# Patient Record
Sex: Female | Born: 1935 | Race: White | Hispanic: No | Marital: Married | Smoking: Never smoker
Health system: Southern US, Community
[De-identification: ages and names within clinical notes are randomized; demographics above are authoritative.]

## PROBLEM LIST (undated history)

## (undated) DIAGNOSIS — E785 Hyperlipidemia, unspecified: Secondary | ICD-10-CM

## (undated) DIAGNOSIS — E119 Type 2 diabetes mellitus without complications: Secondary | ICD-10-CM

## (undated) DIAGNOSIS — D369 Benign neoplasm, unspecified site: Secondary | ICD-10-CM

## (undated) DIAGNOSIS — N951 Menopausal and female climacteric states: Secondary | ICD-10-CM

## (undated) DIAGNOSIS — Z974 Presence of external hearing-aid: Secondary | ICD-10-CM

## (undated) DIAGNOSIS — G4700 Insomnia, unspecified: Secondary | ICD-10-CM

## (undated) DIAGNOSIS — I10 Essential (primary) hypertension: Secondary | ICD-10-CM

## (undated) DIAGNOSIS — M1990 Unspecified osteoarthritis, unspecified site: Secondary | ICD-10-CM

## (undated) HISTORY — DX: Benign neoplasm, unspecified site: D36.9

## (undated) HISTORY — DX: Menopausal and female climacteric states: N95.1

## (undated) HISTORY — DX: Type 2 diabetes mellitus without complications: E11.9

## (undated) HISTORY — PX: MENISCUS REPAIR: SHX5179

## (undated) HISTORY — DX: Insomnia, unspecified: G47.00

## (undated) HISTORY — DX: Essential (primary) hypertension: I10

## (undated) HISTORY — DX: Hyperlipidemia, unspecified: E78.5

---

## 2004-05-11 ENCOUNTER — Ambulatory Visit: Payer: Self-pay | Admitting: Internal Medicine

## 2005-06-21 ENCOUNTER — Ambulatory Visit: Payer: Self-pay | Admitting: Internal Medicine

## 2005-10-20 ENCOUNTER — Ambulatory Visit: Payer: Self-pay | Admitting: Gastroenterology

## 2006-06-28 ENCOUNTER — Ambulatory Visit: Payer: Self-pay | Admitting: Internal Medicine

## 2007-07-05 ENCOUNTER — Ambulatory Visit: Payer: Self-pay | Admitting: Internal Medicine

## 2007-10-09 ENCOUNTER — Ambulatory Visit: Payer: Self-pay | Admitting: Gastroenterology

## 2008-01-10 ENCOUNTER — Ambulatory Visit: Payer: Self-pay | Admitting: Dermatology

## 2008-05-13 ENCOUNTER — Ambulatory Visit: Payer: Self-pay | Admitting: Unknown Physician Specialty

## 2008-07-29 ENCOUNTER — Ambulatory Visit: Payer: Self-pay | Admitting: Internal Medicine

## 2009-07-23 ENCOUNTER — Ambulatory Visit: Payer: Self-pay | Admitting: Family

## 2009-07-24 ENCOUNTER — Ambulatory Visit: Payer: Self-pay | Admitting: Unknown Physician Specialty

## 2009-09-18 ENCOUNTER — Ambulatory Visit: Payer: Self-pay | Admitting: Internal Medicine

## 2009-09-24 ENCOUNTER — Ambulatory Visit: Payer: Self-pay | Admitting: Internal Medicine

## 2010-03-31 ENCOUNTER — Ambulatory Visit: Payer: Self-pay | Admitting: Internal Medicine

## 2010-10-19 ENCOUNTER — Ambulatory Visit: Payer: Self-pay | Admitting: Internal Medicine

## 2010-11-08 ENCOUNTER — Ambulatory Visit (INDEPENDENT_AMBULATORY_CARE_PROVIDER_SITE_OTHER): Payer: Medicare Other | Admitting: Cardiovascular Disease

## 2010-11-08 ENCOUNTER — Encounter: Payer: Self-pay | Admitting: Cardiovascular Disease

## 2010-11-08 DIAGNOSIS — E119 Type 2 diabetes mellitus without complications: Secondary | ICD-10-CM | POA: Insufficient documentation

## 2010-11-08 DIAGNOSIS — I10 Essential (primary) hypertension: Secondary | ICD-10-CM

## 2010-11-08 DIAGNOSIS — R0789 Other chest pain: Secondary | ICD-10-CM

## 2010-11-08 DIAGNOSIS — E785 Hyperlipidemia, unspecified: Secondary | ICD-10-CM | POA: Insufficient documentation

## 2010-11-08 NOTE — Assessment & Plan Note (Signed)
Hemoglobin A1c very well controlled at 6.2.

## 2010-11-08 NOTE — Assessment & Plan Note (Signed)
Cholesterol is at goal on the current lipid regimen. No changes to the medications were made.  

## 2010-11-08 NOTE — Assessment & Plan Note (Signed)
Blood pressure is well controlled on today's visit. No changes made to the medications. 

## 2010-11-08 NOTE — Patient Instructions (Signed)
You are doing well. No medication changes were made. Please call us if you have new issues that need to be addressed before your next appt.  We will call you for a follow up Appt. In 12 months  

## 2010-11-08 NOTE — Assessment & Plan Note (Signed)
Chest "twinge" was very short, atypical in nature. We have asked her to continue her treadmill as she normally does 3 times a week and to contact our office if she has any further symptoms. No further workup has been ordered at this time.

## 2010-11-08 NOTE — Progress Notes (Signed)
   Patient ID: [name], female    DOB: 1932/07/13, 77 y.o.   MRN: [id number]  HPI Comments: 77 year old woman, previously known to me from the [place] office at [facility], history of hyperlipidemia, hypertension, type 2 diabetes who presents to establish care.  She reports that she has been doing well though did have a "twinge" In her left chest that occurred while she was on the treadmill. It lasted for a second and then resolved. She has not been back on the treadmill since that time. Otherwise she feels well, is active with no complaints.   Typically she uses the treadmill 3 times a week and is [place] other [place]. Total cholesterol 151, LDL 78, HDL 60. Normal LFTs, hemoglobin A1c 6.2, normal renal function  EKG shows normal sinus rhythm with a rate 81 beats per minute with no significant ST or T wave changes   Outpatient Encounter Prescriptions as of 11/08/2010  Medication Sig Dispense Refill  . aspirin 81 MG EC tablet Take 81 mg by mouth daily.        . irbesartan-hydrochlorothiazide (AVALIDE) 150-12.5 MG per tablet Take 1 tablet by mouth daily.        . metformin (FORTAMET) 500 MG (OSM) 24 hr tablet Take 500 mg by mouth daily with breakfast.        . PARoxetine (PAXIL-CR) 12.5 MG 24 hr tablet Take 12.5 mg by mouth every morning.        . simvastatin (ZOCOR) 40 MG tablet Take 40 mg by mouth at bedtime.          Review of Systems  Constitutional: Negative.   HENT: Negative.   Eyes: Negative.   Respiratory: Negative.   Cardiovascular: Negative.        Chest twinge  Gastrointestinal: Negative.   Musculoskeletal: Negative.   Skin: Negative.   Neurological: Negative.   Hematological: Negative.   Psychiatric/Behavioral: Negative.   All other systems reviewed and are negative.    BP 125/73  Pulse 81  Ht 5\' 7"  (1.702 m)  Wt 137 lb (62.143 kg)  BMI 21.46 kg/m2  Physical Exam  Nursing note and vitals  reviewed. Constitutional: She is oriented to person, place, and time. She appears well-developed and well-nourished.  HENT:  Head: Normocephalic.  Nose: Nose normal.  Mouth/Throat: Oropharynx is clear and moist.  Eyes: Conjunctivae are normal. Pupils are equal, round, and reactive to light.  Neck: Normal range of motion. Neck supple. No JVD present.  Cardiovascular: Normal rate, regular rhythm, S1 normal, S2 normal, normal heart sounds and intact distal pulses.  Exam reveals no gallop and no friction rub.   No murmur heard. Pulmonary/Chest: Effort normal and breath sounds normal. No respiratory distress. She has no wheezes. She has no rales. She exhibits no tenderness.  Abdominal: Soft. Bowel sounds are normal. She exhibits no distension. There is no tenderness.  Musculoskeletal: Normal range of motion. She exhibits no edema and no tenderness.  Lymphadenopathy:    She has no cervical adenopathy.  Neurological: She is alert and oriented to person, place, and time. Coordination normal.  Skin: Skin is warm and dry. No rash noted. No erythema.  Psychiatric: She has a normal mood and affect. Her behavior is normal. Judgment and thought content normal.         Assessment and Plan

## 2011-01-17 ENCOUNTER — Ambulatory Visit: Payer: Self-pay | Admitting: Gastroenterology

## 2011-10-20 ENCOUNTER — Ambulatory Visit: Payer: Self-pay | Admitting: Internal Medicine

## 2012-10-24 ENCOUNTER — Ambulatory Visit: Payer: Self-pay | Admitting: Internal Medicine

## 2012-10-30 ENCOUNTER — Ambulatory Visit: Payer: Self-pay | Admitting: Internal Medicine

## 2013-04-30 ENCOUNTER — Ambulatory Visit: Payer: Self-pay | Admitting: Internal Medicine

## 2013-09-30 DIAGNOSIS — M8580 Other specified disorders of bone density and structure, unspecified site: Secondary | ICD-10-CM | POA: Insufficient documentation

## 2013-10-28 ENCOUNTER — Ambulatory Visit: Payer: Self-pay | Admitting: Internal Medicine

## 2014-10-08 ENCOUNTER — Other Ambulatory Visit: Payer: Self-pay | Admitting: Internal Medicine

## 2014-10-08 DIAGNOSIS — Z1231 Encounter for screening mammogram for malignant neoplasm of breast: Secondary | ICD-10-CM

## 2014-10-30 ENCOUNTER — Other Ambulatory Visit: Payer: Self-pay | Admitting: Internal Medicine

## 2014-10-30 ENCOUNTER — Ambulatory Visit
Admission: RE | Admit: 2014-10-30 | Discharge: 2014-10-30 | Disposition: A | Discharge status: Home / Self Care | Payer: Medicare Other | Source: Ambulatory Visit | Attending: Internal Medicine | Admitting: Internal Medicine

## 2014-10-30 DIAGNOSIS — Z1231 Encounter for screening mammogram for malignant neoplasm of breast: Secondary | ICD-10-CM | POA: Insufficient documentation

## 2015-04-14 DIAGNOSIS — D692 Other nonthrombocytopenic purpura: Secondary | ICD-10-CM | POA: Insufficient documentation

## 2015-10-16 ENCOUNTER — Other Ambulatory Visit: Payer: Self-pay | Admitting: Internal Medicine

## 2015-10-16 DIAGNOSIS — Z1231 Encounter for screening mammogram for malignant neoplasm of breast: Secondary | ICD-10-CM

## 2015-11-03 ENCOUNTER — Other Ambulatory Visit: Payer: Self-pay | Admitting: Internal Medicine

## 2015-11-03 ENCOUNTER — Ambulatory Visit
Admission: RE | Admit: 2015-11-03 | Discharge: 2015-11-03 | Disposition: A | Discharge status: Home / Self Care | Payer: Medicare Other | Source: Ambulatory Visit | Attending: Internal Medicine | Admitting: Internal Medicine

## 2015-11-03 DIAGNOSIS — Z1231 Encounter for screening mammogram for malignant neoplasm of breast: Secondary | ICD-10-CM | POA: Insufficient documentation

## 2016-02-09 DIAGNOSIS — M79673 Pain in unspecified foot: Secondary | ICD-10-CM | POA: Insufficient documentation

## 2016-09-26 ENCOUNTER — Other Ambulatory Visit: Payer: Self-pay | Admitting: Internal Medicine

## 2016-09-26 DIAGNOSIS — Z1231 Encounter for screening mammogram for malignant neoplasm of breast: Secondary | ICD-10-CM

## 2016-11-07 ENCOUNTER — Ambulatory Visit
Admission: RE | Admit: 2016-11-07 | Discharge: 2016-11-07 | Disposition: A | Discharge status: Home / Self Care | Payer: Medicare Other | Source: Ambulatory Visit | Attending: Internal Medicine | Admitting: Internal Medicine

## 2016-11-07 DIAGNOSIS — Z1231 Encounter for screening mammogram for malignant neoplasm of breast: Secondary | ICD-10-CM | POA: Insufficient documentation

## 2017-02-08 ENCOUNTER — Ambulatory Visit: Payer: Medicare Other | Attending: Otolaryngology

## 2017-02-08 DIAGNOSIS — G4733 Obstructive sleep apnea (adult) (pediatric): Secondary | ICD-10-CM | POA: Insufficient documentation

## 2017-02-08 DIAGNOSIS — G4719 Other hypersomnia: Secondary | ICD-10-CM | POA: Insufficient documentation

## 2017-02-08 DIAGNOSIS — R0683 Snoring: Secondary | ICD-10-CM | POA: Insufficient documentation

## 2017-02-24 ENCOUNTER — Ambulatory Visit: Payer: Medicare Other | Attending: Otolaryngology

## 2017-02-24 DIAGNOSIS — G4733 Obstructive sleep apnea (adult) (pediatric): Secondary | ICD-10-CM | POA: Insufficient documentation

## 2017-12-06 DIAGNOSIS — F3342 Major depressive disorder, recurrent, in full remission: Secondary | ICD-10-CM | POA: Insufficient documentation

## 2017-12-07 ENCOUNTER — Other Ambulatory Visit: Payer: Self-pay | Admitting: Internal Medicine

## 2017-12-07 DIAGNOSIS — Z1231 Encounter for screening mammogram for malignant neoplasm of breast: Secondary | ICD-10-CM

## 2017-12-25 ENCOUNTER — Ambulatory Visit
Admission: RE | Admit: 2017-12-25 | Discharge: 2017-12-25 | Disposition: A | Discharge status: Home / Self Care | Payer: Medicare Other | Source: Ambulatory Visit | Attending: Internal Medicine | Admitting: Internal Medicine

## 2017-12-25 DIAGNOSIS — Z1231 Encounter for screening mammogram for malignant neoplasm of breast: Secondary | ICD-10-CM | POA: Diagnosis present

## 2018-11-07 ENCOUNTER — Other Ambulatory Visit: Payer: Self-pay

## 2018-11-07 ENCOUNTER — Encounter: Payer: Medicare Other | Attending: Internal Medicine | Admitting: Internal Medicine

## 2018-11-07 DIAGNOSIS — I87302 Chronic venous hypertension (idiopathic) without complications of left lower extremity: Secondary | ICD-10-CM | POA: Diagnosis not present

## 2018-11-07 DIAGNOSIS — I10 Essential (primary) hypertension: Secondary | ICD-10-CM | POA: Insufficient documentation

## 2018-11-07 DIAGNOSIS — E11622 Type 2 diabetes mellitus with other skin ulcer: Secondary | ICD-10-CM | POA: Diagnosis not present

## 2018-11-07 DIAGNOSIS — L97221 Non-pressure chronic ulcer of left calf limited to breakdown of skin: Secondary | ICD-10-CM | POA: Insufficient documentation

## 2018-11-07 DIAGNOSIS — Z7982 Long term (current) use of aspirin: Secondary | ICD-10-CM | POA: Diagnosis not present

## 2018-11-08 NOTE — Progress Notes (Signed)
LYRIK, DOCKSTADER ([id number]) Visit Report for 11/07/2018 Chief Complaint Document Details Patient Name: [name], [name]. Date of Service: 11/07/2018 2:15 PM Medical Record Number: [id number] Patient Account Number: [id number] Date of Birth/Sex: 11/29/1932 (85 y.o. F) Treating RN: Cornell Barman Primary Care Provider: Ramonita Lab Other Clinician: Referring Provider: Beverly Gust Treating Provider/Extender: Beverly Gust in Treatment: 0 Information Obtained from: Patient Chief Complaint Left leg skin tear from dog scratch Electronic Signature(s) Signed: 11/07/2018 2:45:58 PM By: Tobi Bastos Entered By: Tobi Bastos on 11/07/2018 14:45:58 Vitiello, Germain Osgood ([id number]) -------------------------------------------------------------------------------- Debridement Details Patient Name: [name]. Date of Service: 11/07/2018 2:15 PM Medical Record Number: [id number] Patient Account Number: [id number] Date of Birth/Sex: 09/02/1932 (85 y.o. F) Treating RN: Cornell Barman Primary Care Provider: Ramonita Lab Other Clinician: Referring Provider: Beverly Gust Treating Provider/Extender: Beverly Gust in Treatment: 0 Debridement Performed for Wound #1 Left,Anterior Lower Leg Assessment: Performed By: Physician Tobi Bastos, MD Debridement Type: Debridement Severity of Tissue Pre Fat layer exposed Debridement: Level of Consciousness (Pre- Awake and Alert procedure): Pre-procedure Verification/Time Yes - 14:41 Out Taken: Start Time: 14:41 Pain Control: Lidocaine Total Area Debrided (L x W): 2 (cm) x 1.8 (cm) = 3.6 (cm) Tissue and other material Non-Viable, Skin: Dermis , Skin: Epidermis debrided: Level: Skin/Epidermis Debridement Description: Selective/Open Wound Instrument: Forceps, Scissors Bleeding: Moderate Hemostasis Achieved: Pressure End Time: 14:42 Response to Treatment: Procedure was tolerated well Level of Consciousness Awake and  Alert (Post-procedure): Post Debridement Measurements of Total Wound Length: (cm) 2 Width: (cm) 1.8 Depth: (cm) 0.1 Volume: (cm) 0.283 Character of Wound/Ulcer Post Debridement: Improved Severity of Tissue Post Debridement: Fat layer exposed Post Procedure Diagnosis Same as Pre-procedure Electronic Signature(s) Signed: 11/07/2018 4:28:38 PM By: Tobi Bastos Signed: 11/07/2018 5:00:13 PM By: Gretta Cool, BSN, RN, CWS, Kim RN, BSN Entered By: Gretta Cool, BSN, RN, CWS, Kim on 11/07/2018 14:43:08 Delpozo, Germain Osgood ([id number]) -------------------------------------------------------------------------------- HPI Details Patient Name: [name]. Date of Service: 11/07/2018 2:15 PM Medical Record Number: [id number] Patient Account Number: [id number] Date of Birth/Sex: Dec 10, 1932 (85 y.o. F) Treating RN: Cornell Barman Primary Care Provider: Ramonita Lab Other Clinician: Referring Provider: Beverly Gust Treating Provider/Extender: Beverly Gust in Treatment: 0 History of Present Illness HPI Description: Pleasant 85 year old female who sustained dog scratch injury to the left anterior leg after her son's golden retriever greeted her and accidentally scratched her leg. This happened about 2 weeks ago and since then she has had a flap of skin covering the area she has not been using any particular dressing or ointment to this wound which has a partial layer of skin covering the lower end of it and she decided to come to the wound clinic to get some attention to it. She denies any significant drainage or discomfort or swelling Patient's history is unremarkable for any significant medical issues she is on aspirin and simvastatin prescribed by her son but she essentially has hypertension as her only medical problem. She does take a baby aspirin But is on no other blood thinners ABIs in the clinic today on the left 1.05 Electronic Signature(s) Signed: 11/07/2018 2:48:14 PM By: Tobi Bastos Entered By: Tobi Bastos on 11/07/2018 14:48:13 Mabie, Germain Osgood ([id number]) -------------------------------------------------------------------------------- Physical Exam Details Patient Name: [name]. Date of Service: 11/07/2018 2:15 PM Medical Record Number: [id number] Patient Account Number: [id number] Date of Birth/Sex: 09-21-1932 (85 y.o. F) Treating RN: Cornell Barman Primary Care Provider: Ramonita Lab Other Clinician: Referring Provider: Beverly Gust Treating Provider/Extender: Beverly Gust in Treatment: 0  Constitutional alert and oriented x 3. sitting or standing blood pressure is within target range for patient.. supine blood pressure is within target range for patient.. pulse regular and within target range for patient.Marland Kitchen respirations regular, non-labored and within target range for patient.Marland Kitchen temperature within target range for patient.. . . Well-nourished and well-hydrated in no acute distress. Eyes conjunctiva clear no eyelid edema noted. pupils equal round and reactive to light and accommodation. Neck supple with no LAD noted in anterior or posterior cervical chain. not enlarged. Respiratory normal breathing without difficulty. clear to auscultation bilaterally. Cardiovascular regular rate and rhythm with normal S1, S2. no bruits with no significant JVD. 2+ femoral pulses. 2+ dorsalis pedis/posterior tibialis pulses. no clubbing, cyanosis, significant edema, <3 sec cap refill. Gastrointestinal (GI) soft, non-tender, non-distended, +BS. no hepatosplenomegaly. no ventral hernia noted. Neurological cranial nerves 2-12 intact. Patient has normal sensation in the feet bilaterally to light touch. Psychiatric this patient is able to make decisions and demonstrates good insight into disease process. Alert and Oriented x 3. pleasant and cooperative. Notes Left anterior leg wound that is superficial skin deep, with a flap of skin that is partially  necrosed, this was removed with pickup and scissors, there was some bleeding from the lower margin of the wound that stopped after pressure was applied. Surrounding skin looks intact there is no edema in the leg pulses are palpable dorsalis pedis Electronic Signature(s) Signed: 11/07/2018 2:49:26 PM By: Tobi Bastos Entered By: Tobi Bastos on 11/07/2018 14:49:25 [name] ([id number]) -------------------------------------------------------------------------------- Physician Orders Details Patient Name: [name]. Date of Service: 11/07/2018 2:15 PM Medical Record Number: [id number] Patient Account Number: [id number] Date of Birth/Sex: 1932/04/24 (85 y.o. F) Treating RN: Cornell Barman Primary Care Provider: Ramonita Lab Other Clinician: Referring Provider: Beverly Gust Treating Provider/Extender: Beverly Gust in Treatment: 0 Verbal / Phone Orders: No Diagnosis Coding Wound Cleansing Wound #1 Left,Anterior Lower Leg o Clean wound with Normal Saline. Anesthetic (add to Medication List) Wound #1 Left,Anterior Lower Leg o Topical Lidocaine 4% cream applied to wound bed prior to debridement (In Clinic Only). Primary Wound Dressing Wound #1 Left,Anterior Lower Leg o Silver Alginate Secondary Dressing o ABD and Kerlix/Conform - stretch net to secure Dressing Change Frequency Wound #1 Left,Anterior Lower Leg o Change dressing every other day. Follow-up Appointments Wound #1 Left,Anterior Lower Leg o Return Appointment in 1 week. Edema Control o Elevate legs to the level of the heart and pump ankles as often as possible Services and Therapies o DME provider, dressing supplies - [facility] Patient Medications Allergies: No Known Drug Allergies Notifications Medication Indication Start End lidocaine DOSE topical 4 % cream - cream topical Electronic Signature(s) Signed: 11/07/2018 4:28:38 PM By: Tobi Bastos Signed: 11/07/2018 5:00:13 PM  By: Gretta Cool, BSN, RN, CWS, Kim RN, BSN [address], [place] ([id number]) Entered By: Gretta Cool, BSN, RN, CWS, Kim on 11/07/2018 14:47:33 COLTON, ENGDAHL ([id number]) -------------------------------------------------------------------------------- Prescription 11/07/2018 Patient Name: [name]. Provider: Tobi Bastos MD Date of Birth: Aug 11, 1932 NPI#: [id number] Sex: F DEA#: Phone #: [phone] License #: Patient Address: [facility] [place], [place] [address] [address] [place], [place] [address] [phone] Allergies No Known Drug Allergies Medication Medication: Route: Strength: Form: lidocaine 4 % topical cream topical 4% cream Class: TOPICAL LOCAL ANESTHETICS Dose: Frequency / Time: Indication: cream topical Number of Refills: Number of Units: 0 Generic Substitution: Start Date: End Date: One Time Use: Substitution Permitted No Note  to Pharmacy: Signature(s): Date(s): Electronic Signature(s) Signed: 11/07/2018 4:28:38 PM By: Tobi Bastos Signed: 11/07/2018 5:00:13 PM By: Gretta Cool, BSN, RN, CWS, Kim RN, BSN Entered By: Gretta Cool, BSN, RN, CWS, Kim on 11/07/2018 14:47:33 [name] ([id number]) --------------------------------------------------------------------------------  Problem List Details Patient Name: [name]. Date of Service: 11/07/2018 2:15 PM Medical Record Number: [id number] Patient Account Number: [id number] Date of Birth/Sex: 03-23-1933 (85 y.o. 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 Date of Service: 11/07/2018 2:15 PM Medical Record Number: [id number] Patient Account Number: [id number] Date of Birth/Sex: 1932-12-13 (85 y.o. F) Treating RN: Cornell Barman Primary Care Provider: Ramonita Lab Other Clinician: Referring Provider: Beverly Gust Treating Provider/Extender: Beverly Gust in Treatment: 0 Subjective Chief Complaint Information obtained from Patient Left leg skin tear from dog scratch History of Present Illness (HPI) Pleasant 85 year old female who sustained dog scratch injury to the left anterior leg after her son's golden retriever greeted her and accidentally scratched her leg. This happened about 2 weeks ago and since then she has had a flap of skin covering the area she has not been using any particular dressing or ointment to this wound which has a partial layer of skin covering the lower end of it and she decided to come to the wound clinic to get some attention to it. She denies any significant drainage or discomfort or swelling Patient's history is unremarkable for any significant medical issues she is on aspirin and simvastatin prescribed by her son but she essentially has hypertension as her only medical problem. She does take a baby aspirin But is on no other blood thinners ABIs in the clinic today on the left 1.05 Patient History Information obtained from Patient. Allergies No Known Drug Allergies Family History Cancer - Father, Heart Disease - Mother, Hypertension - Mother, Lung Disease - Mother, No family history of Diabetes, Hereditary Spherocytosis, Kidney Disease, Seizures, Stroke, Thyroid Problems, Tuberculosis. Social History Never  smoker, Marital Status - Married, Alcohol Use - Daily, Drug Use - No History, Caffeine Use - Rarely. Medical History Endocrine Patient has history of Type II Diabetes Denies history of Type I Diabetes Musculoskeletal Denies history of Gout, Rheumatoid Arthritis, Osteoarthritis, Osteomyelitis Patient is treated with Oral Agents. Medical And Surgical History Notes Musculoskeletal OP Review of Systems (ROS) Constitutional Symptoms (General Health) Denies complaints or symptoms of Fatigue, Fever, Chills, Marked Weight Change. Eyes Vanderweide, Tanya H. ([id number]) Denies complaints or symptoms of Dry Eyes, Vision Changes, Glasses / Contacts. Ear/Nose/Mouth/Throat Denies complaints or symptoms of Difficult clearing ears, Sinusitis. Hematologic/Lymphatic Denies complaints or symptoms of Bleeding / Clotting Disorders, Human Immunodeficiency Virus. Respiratory Denies complaints or symptoms of Chronic or frequent coughs, Shortness of Breath. Cardiovascular Denies complaints or symptoms of Chest pain, LE edema. Gastrointestinal Denies complaints or symptoms of Frequent diarrhea, Nausea, Vomiting. Endocrine Denies complaints or symptoms of Hepatitis, Thyroid disease, Polydypsia (Excessive Thirst). Genitourinary Denies complaints or symptoms of Kidney failure/ Dialysis, Incontinence/dribbling. Immunological Denies complaints or symptoms of Hives, Itching. Integumentary (Skin) Denies complaints or symptoms of Wounds, Bleeding or bruising tendency, Breakdown, Swelling. Musculoskeletal Denies  complaints or symptoms of Muscle Pain, Muscle Weakness. Neurologic Denies complaints or symptoms of Numbness/parasthesias, Focal/Weakness. Psychiatric Denies complaints or symptoms of Anxiety, Claustrophobia. Objective Constitutional alert and oriented x 3. sitting or standing blood pressure is within target range for patient.. supine blood pressure is within target range for patient.. pulse regular and  within target range for patient.Marland Kitchen respirations regular, non-labored and within target range for patient.Marland Kitchen temperature within target range for patient.. Well-nourished and well-hydrated in no acute distress. Vitals Time Taken: 2:20 PM, Height: 66 in, Source: Measured, Weight: 125 lbs, Source: Measured, BMI: 20.2, Temperature: 98.7 F, Pulse: 80 bpm, Respiratory Rate: 16 breaths/min, Blood Pressure: 138/73 mmHg. Eyes conjunctiva clear no eyelid edema noted. pupils equal round and reactive to light and accommodation. Neck supple with no LAD noted in anterior or posterior cervical chain. not enlarged. Respiratory normal breathing without difficulty. clear to auscultation bilaterally. Cardiovascular regular rate and rhythm with normal S1, S2. no bruits with no significant JVD. 2+ femoral pulses. 2+ dorsalis pedis/posterior tibialis pulses. no clubbing, cyanosis, significant edema, Gastrointestinal (GI) soft, non-tender, non-distended, +BS. no hepatosplenomegaly. no ventral hernia noted. Clopper, Louise ([id number]) Neurological cranial nerves 2-12 intact. Patient has normal sensation in the feet bilaterally to light touch. Psychiatric this patient is able to make decisions and demonstrates good insight into disease process. Alert and Oriented x 3. pleasant and cooperative. General Notes: Left anterior leg wound that is superficial skin deep, with a flap of skin that is partially necrosed, this was removed with pickup and scissors, there was some bleeding from the lower margin of the wound that stopped after pressure was applied. Surrounding skin looks intact there is no edema in the leg pulses are palpable dorsalis pedis Integumentary (Hair, Skin) Wound #1 status is Open. Original cause of wound was Trauma. The wound is located on the Left,Anterior Lower Leg. The wound measures 2cm length x 1.8cm width x 0.1cm depth; 2.827cm^2 area and 0.283cm^3 volume. There is Fat Layer (Subcutaneous Tissue)  Exposed exposed. There is no tunneling or undermining noted. There is a medium amount of serous drainage noted. The wound margin is flat and intact. There is small (1-33%) pink granulation within the wound bed. There is a large (67-100%) amount of necrotic tissue within the wound bed including Adherent Slough. Assessment Active Problems ICD-10 Non-pressure chronic ulcer of left calf limited to breakdown of skin Procedures Wound #1 Pre-procedure diagnosis of Wound #1 is a Diabetic Wound/Ulcer of the Lower Extremity located on the Left,Anterior Lower Leg .Severity of Tissue Pre Debridement is: Fat layer exposed. There was a Selective/Open Wound Skin/Epidermis Debridement with a total area of 3.6 sq cm performed by Tobi Bastos, MD. With the following instrument(s): Forceps, and Scissors to remove Non-Viable tissue/material. Material removed includes Skin: Dermis and Skin: Epidermis and after achieving pain control using Lidocaine. No specimens were taken. A time out was conducted at 14:41, prior to the start of the procedure. A Moderate amount of bleeding was controlled with Pressure. The procedure was tolerated well. Post Debridement Measurements: 2cm length x 1.8cm width x 0.1cm depth; 0.283cm^3 volume. Character of Wound/Ulcer Post Debridement is improved. Severity of Tissue Post Debridement is: Fat layer exposed. Post procedure Diagnosis Wound #1: Same as Pre-Procedure Plan Wound Cleansing: Wound #1 Left,Anterior Lower Leg: Clean wound with Normal Saline. MEKAILA, TARNOW ([id number]) Anesthetic (add to Medication List): Wound #1 Left,Anterior Lower Leg: Topical Lidocaine 4% cream applied to wound bed prior to debridement (In Clinic Only). Primary Wound Dressing: Wound #1 Left,Anterior  Lower Leg: Silver Alginate Secondary Dressing: ABD and Kerlix/Conform - stretch net to secure Dressing Change Frequency: Wound #1 Left,Anterior Lower Leg: Change dressing every other  day. Follow-up Appointments: Wound #1 Left,Anterior Lower Leg: Return Appointment in 1 week. Edema Control: Elevate legs to the level of the heart and pump ankles as often as possible Services and Therapies ordered were: DME provider, dressing supplies - [facility] The following medication(s) was prescribed: lidocaine topical 4 % cream cream topical was prescribed at facility 1. Superficial skin wound with no signs of infection 2. This is essentially a traumatic injury from a dog scratch that is not infected 3. We will use silver alginate and 2 every other day dressing changes with gauze and Kerlix 4. Return to clinic next week Electronic Signature(s) Signed: 11/07/2018 2:50:34 PM By: Tobi Bastos Entered By: Tobi Bastos on 11/07/2018 14:50:34 [name] ([id number]) -------------------------------------------------------------------------------- ROS/PFSH Details Patient Name: [name]. Date of Service: 11/07/2018 2:15 PM Medical Record Number: [id number] Patient Account Number: [id number] Date of Birth/Sex: [birth year] (85 y.o. F) Treating RN: Montey Hora Primary Care Provider: Ramonita Lab Other Clinician: Referring Provider: Beverly Gust Treating Provider/Extender: Beverly Gust in Treatment: 0 Information Obtained From Patient Constitutional Symptoms (General Health) Complaints and Symptoms: Negative for: Fatigue; Fever; Chills; Marked Weight Change Eyes Complaints and Symptoms: Negative for: Dry Eyes; Vision Changes; Glasses / Contacts Ear/Nose/Mouth/Throat Complaints and Symptoms: Negative for: Difficult clearing ears; Sinusitis Hematologic/Lymphatic Complaints and Symptoms: Negative for: Bleeding / Clotting Disorders; Human Immunodeficiency Virus Respiratory Complaints and Symptoms: Negative for: Chronic or frequent coughs; Shortness of Breath Cardiovascular Complaints and Symptoms: Negative for: Chest pain; LE  edema Gastrointestinal Complaints and Symptoms: Negative for: Frequent diarrhea; Nausea; Vomiting Endocrine Complaints and Symptoms: Negative for: Hepatitis; Thyroid disease; Polydypsia (Excessive Thirst) Medical History: Positive for: Type II Diabetes Negative for: Type I Diabetes Treated with: Oral agents Genitourinary Snethen, Keyetta H. ([id number]) Complaints and Symptoms: Negative for: Kidney failure/ Dialysis; Incontinence/dribbling Immunological Complaints and Symptoms: Negative for: Hives; Itching Integumentary (Skin) Complaints and Symptoms: Negative for: Wounds; Bleeding or bruising tendency; Breakdown; Swelling Musculoskeletal Complaints and Symptoms: Negative for: Muscle Pain; Muscle Weakness Medical History: Negative for: Gout; Rheumatoid Arthritis; Osteoarthritis; Osteomyelitis Past Medical History Notes: OP Neurologic Complaints and Symptoms: Negative for: Numbness/parasthesias; Focal/Weakness Psychiatric Complaints and Symptoms: Negative for: Anxiety; Claustrophobia Immunizations Pneumococcal Vaccine: Received Pneumococcal Vaccination: Yes Implantable Devices None Family and Social History Cancer: Yes - Father; Diabetes: No; Heart Disease: Yes - Mother; Hereditary Spherocytosis: No; Hypertension: Yes - Mother; Kidney Disease: No; Lung Disease: Yes - Mother; Seizures: No; Stroke: No; Thyroid Problems: No; Tuberculosis: No; Never smoker; Marital Status - Married; Alcohol Use: Daily; Drug Use: No History; Caffeine Use: Rarely; Financial Concerns: No; Food, Clothing or Shelter Needs: No; Support System Lacking: No; Transportation Concerns: No Electronic Signature(s) Signed: 11/07/2018 3:05:31 PM By: Montey Hora Signed: 11/07/2018 4:28:38 PM By: Tobi Bastos Entered By: Montey Hora on 11/07/2018 14:25:47 Dorrance, Germain Osgood ([id number]) -------------------------------------------------------------------------------- SuperBill Details Patient Name:  [name]. Date of Service: 11/07/2018 Medical Record Number: [id number] Patient Account Number: [id number] Date of Birth/Sex: [birth year] (85 y.o. 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

## 2018-11-08 NOTE — Progress Notes (Addendum)
CASSITY, CHRISTIAN ([id number]) Visit Report for 11/07/2018 Allergy List Details Patient Name: NATALEY, BAHRI. Date of Service: 11/07/2018 2:15 PM Medical Record Number: [id number] Patient Account Number: [id number] Date of Birth/Sex: 01/16/1933 (85 y.o. F) Treating RN: Montey Hora Primary Care Ailanie Ruttan: Ramonita Lab Other Clinician: Referring Rosha Cocker: Beverly Gust Treating Laurin Paulo/Extender: Beverly Gust in Treatment: 0 Allergies Active Allergies No Known Drug Allergies Allergy Notes Electronic Signature(s) Signed: 11/07/2018 3:05:31 PM By: Montey Hora Entered By: Montey Hora on 11/07/2018 14:21:51 [name] ([id number]) -------------------------------------------------------------------------------- Arrival Information Details Patient Name: [name]. Date of Service: 11/07/2018 2:15 PM Medical Record Number: [id number] Patient Account Number: [id number] Date of Birth/Sex: 1932/10/27 (85 y.o. F) Treating RN: Montey Hora Primary Care Cynthis Purington: Ramonita Lab Other Clinician: Referring Kartel Wolbert: Beverly Gust Treating Janny Crute/Extender: Beverly Gust in Treatment: 0 Visit Information Patient Arrived: Ambulatory Arrival Time: 14:17 Accompanied By: husband Transfer Assistance: None Patient Identification Verified: Yes Secondary Verification Process Completed: Yes Patient Has Alerts: Yes Patient Alerts: DMII Electronic Signature(s) Signed: 11/07/2018 3:05:31 PM By: Montey Hora Entered By: Montey Hora on 11/07/2018 14:17:55 Mehler, Germain Osgood ([id number]) -------------------------------------------------------------------------------- Clinic Level of Care Assessment Details Patient Name: [name]. Date of Service: 11/07/2018 2:15 PM Medical Record Number: [id number] Patient Account Number: [id number] Date of Birth/Sex: May 09, 1932 (85 y.o. F) Treating RN: Cornell Barman Primary Care Ronan Dion: Ramonita Lab Other Clinician: Referring  Monque Haggar: Beverly Gust Treating Shalae Belmonte/Extender: Beverly Gust in Treatment: 0 Clinic Level of Care Assessment Items TOOL 4 Quantity Score []  - Use when only an EandM is performed on FOLLOW-UP visit 0 ASSESSMENTS - Nursing Assessment / Reassessment X - Reassessment of Co-morbidities (includes updates in patient status) 1 10 X- 1 5 Reassessment of Adherence to Treatment Plan ASSESSMENTS - Wound and Skin Assessment / Reassessment X - Simple Wound Assessment / Reassessment - one wound 1 5 []  - 0 Complex Wound Assessment / Reassessment - multiple wounds []  - 0 Dermatologic / Skin Assessment (not related to wound area) ASSESSMENTS - Focused Assessment []  - Circumferential Edema Measurements - multi extremities 0 []  - 0 Nutritional Assessment / Counseling / Intervention []  - 0 Lower Extremity Assessment (monofilament, tuning fork, pulses) []  - 0 Peripheral Arterial Disease Assessment (using hand held doppler) ASSESSMENTS - Ostomy and/or Continence Assessment and Care []  - Incontinence Assessment and Management 0 []  - 0 Ostomy Care Assessment and Management (repouching, etc.) PROCESS - Coordination of Care X - Simple Patient / Family Education for ongoing care 1 15 []  - 0 Complex (extensive) Patient / Family Education for ongoing care X- 1 10 Staff obtains Programmer, systems, Records, Test Results / Process Orders []  - 0 Staff telephones HHA, Nursing Homes / Clarify orders / etc []  - 0 Routine Transfer to another Facility (non-emergent condition) []  - 0 Routine Hospital Admission (non-emergent condition) X- 1 15 New Admissions / Biomedical engineer / Ordering NPWT, Apligraf, etc. []  - 0 Emergency Hospital Admission (emergent condition) X- 1 10 Simple Discharge Coordination Karwowski, Caria H. ([id number]) []  - 0 Complex (extensive) Discharge Coordination PROCESS - Special Needs []  - Pediatric / Minor Patient Management 0 []  - 0 Isolation Patient Management []  -  0 Hearing / Language / Visual special needs []  - 0 Assessment of Community assistance (transportation, D/C planning, etc.) []  - 0 Additional assistance / Altered mentation []  - 0 Support Surface(s) Assessment (bed, cushion, seat, etc.) INTERVENTIONS - Wound Cleansing / Measurement X - Simple Wound Cleansing - one wound 1 5 []  - 0 Complex Wound Cleansing -  multiple wounds X- 1 5 Wound Imaging (photographs - any number of wounds) []  - 0 Wound Tracing (instead of photographs) X- 1 5 Simple Wound Measurement - one wound []  - 0 Complex Wound Measurement - multiple wounds INTERVENTIONS - Wound Dressings []  - Small Wound Dressing one or multiple wounds 0 X- 1 15 Medium Wound Dressing one or multiple wounds []  - 0 Large Wound Dressing one or multiple wounds []  - 0 Application of Medications - topical []  - 0 Application of Medications - injection INTERVENTIONS - Miscellaneous []  - External ear exam 0 []  - 0 Specimen Collection (cultures, biopsies, blood, body fluids, etc.) []  - 0 Specimen(s) / Culture(s) sent or taken to Lab for analysis []  - 0 Patient Transfer (multiple staff / Civil Service fast streamer / Similar devices) []  - 0 Simple Staple / Suture removal (25 or less) []  - 0 Complex Staple / Suture removal (26 or more) []  - 0 Hypo / Hyperglycemic Management (close monitor of Blood Glucose) []  - 0 Ankle / Brachial Index (ABI) - do not check if billed separately X- 1 5 Vital Signs Fernandez, Jennessa H. ([id number]) Has the patient been seen at the hospital within the last three years: Yes Total Score: 105 Level Of Care: New/Established - Level 3 Electronic Signature(s) Signed: 11/07/2018 5:00:13 PM By: Gretta Cool, BSN, RN, CWS, Kim RN, BSN Entered By: Gretta Cool, BSN, RN, CWS, Kim on 11/07/2018 14:48:28 [name] ([id number]) -------------------------------------------------------------------------------- Encounter Discharge Information Details Patient Name: [name]. Date of  Service: 11/07/2018 2:15 PM Medical Record Number: [id number] Patient Account Number: [id number] Date of Birth/Sex: 06-17-1932 (85 y.o. F) Treating RN: Cornell Barman Primary Care Lehman Whiteley: Ramonita Lab Other Clinician: Referring Juniper Cobey: Beverly Gust Treating Daymian Lill/Extender: Beverly Gust in Treatment: 0 Encounter Discharge Information Items Post Procedure Vitals Discharge Condition: Stable Temperature (F): 98.7 Ambulatory Status: Ambulatory Pulse (bpm): 80 Discharge Destination: Home Respiratory Rate (breaths/min): 16 Transportation: Private Auto Blood Pressure (mmHg): 138/73 Accompanied By: self Schedule Follow-up Appointment: Yes Clinical Summary of Care: Electronic Signature(s) Signed: 11/07/2018 5:00:13 PM By: Gretta Cool, BSN, RN, CWS, Kim RN, BSN Entered By: Gretta Cool, BSN, RN, CWS, Kim on 11/07/2018 14:50:48 [name] ([id number]) -------------------------------------------------------------------------------- Lower Extremity Assessment Details Patient Name: Leppanen, Aneliese H. Date of Service: 11/07/2018 2:15 PM Medical Record Number: [id number] Patient Account Number: [id number] Date of Birth/Sex: [birth year] (85 y.o. F) Treating RN: Montey Hora Primary Care Zaion Hreha: Ramonita Lab Other Clinician: Referring Izumi Mixon: Beverly Gust Treating Semaje Kinker/Extender: Beverly Gust in Treatment: 0 Edema Assessment Assessed: [Left: No] [Right: No] Edema: [Left: No] [Right: No] Calf Left: Right: Point of Measurement: 30 cm From Medial Instep 31 cm cm Ankle Left: Right: Point of Measurement: 10 cm From Medial Instep 19 cm cm Vascular Assessment Pulses: Dorsalis Pedis Palpable: [Left:Yes] [Right:Yes] Doppler Audible: [Left:Yes] Posterior Tibial Palpable: [Left:Yes] [Right:Yes] Doppler Audible: [Left:Yes] Blood Pressure: Brachial: [Left:138] Dorsalis Pedis: 145 Ankle: Posterior Tibial: 130 Ankle Brachial Index: [Left:1.05] Electronic  Signature(s) Signed: 11/07/2018 3:05:31 PM By: Montey Hora Entered By: Montey Hora on 11/07/2018 14:30:42 Cobin, Ariahna Lemmie Evens ([id number]) -------------------------------------------------------------------------------- Multi Wound Chart Details Patient Name: [name]. Date of Service: 11/07/2018 2:15 PM Medical Record Number: [id number] Patient Account Number: [id number] Date of Birth/Sex: 1932-07-30 (85 y.o. F) Treating RN: Cornell Barman Primary Care Lavonna Lampron: Ramonita Lab Other Clinician: Referring Danyia Borunda: Beverly Gust Treating Samari Gorby/Extender: Beverly Gust in Treatment: 0 Vital Signs Height(in): 66 Pulse(bpm): 80 Weight(lbs): 125 Blood Pressure(mmHg): 138/73 Body Mass Index(BMI): 20 Temperature(F): 98.7 Respiratory Rate 16 (breaths/min): Photos: [N/A:N/A] Wound Location:  Left Lower Leg - Anterior N/A N/A Wounding Event: Trauma N/A N/A Primary Etiology: Diabetic Wound/Ulcer of the N/A N/A Lower Extremity Secondary Etiology: Skin Tear N/A N/A Comorbid History: Type II Diabetes N/A N/A Date Acquired: 10/22/2018 N/A N/A Weeks of Treatment: 0 N/A N/A Wound Status: Open N/A N/A Measurements L x W x D 2x1.8x0.1 N/A N/A (cm) Area (cm) : 2.827 N/A N/A Volume (cm) : 0.283 N/A N/A Classification: Grade 1 N/A N/A Exudate Amount: Medium N/A N/A Exudate Type: Serous N/A N/A Exudate Color: amber N/A N/A Wound Margin: Flat and Intact N/A N/A Granulation Amount: Small (1-33%) N/A N/A Granulation Quality: Pink N/A N/A Necrotic Amount: Large (67-100%) N/A N/A Exposed Structures: Fat Layer (Subcutaneous N/A N/A Tissue) Exposed: Yes Epithelialization: None N/A N/A Treatment Notes Electronic Signature(s) LILLAR, BIANCA ([id number]) Signed: 11/07/2018 5:00:13 PM By: Gretta Cool, BSN, RN, CWS, Kim RN, BSN Entered By: Gretta Cool, BSN, RN, CWS, Kim on 11/07/2018 14:41:27 [name]  ([id number]) -------------------------------------------------------------------------------- Multi-Disciplinary Care Plan Details Patient Name: [name]. Date of Service: 11/07/2018 2:15 PM Medical Record Number: [id number] Patient Account Number: [id number] Date of Birth/Sex: Sep 17, 1932 (85 y.o. F) Treating RN: Cornell Barman Primary Care Nani Ingram: Ramonita Lab Other Clinician: Referring Otis Burress: Beverly Gust Treating Tramayne Sebesta/Extender: Beverly Gust in Treatment: 0 Active Inactive Necrotic Tissue Nursing Diagnoses: Impaired tissue integrity related to necrotic/devitalized tissue Goals: Necrotic/devitalized tissue will be minimized in the wound bed Date Initiated: 11/07/2018 Target Resolution Date: 11/15/2018 Goal Status: Active Interventions: Assess patient pain level pre-, during and post procedure and prior to discharge Treatment Activities: Excisional debridement : 11/07/2018 Notes: Orientation to the Wound Care Program Nursing Diagnoses: Knowledge deficit related to the wound healing center program Goals: Patient/caregiver will verbalize understanding of the [place] Date Initiated: 11/07/2018 Target Resolution Date: 11/07/2018 Goal Status: Active Interventions: Provide education on orientation to the wound center Notes: Wound/Skin Impairment Nursing Diagnoses: Impaired tissue integrity Goals: Patient/caregiver will verbalize understanding of skin care regimen Date Initiated: 11/07/2018 Target Resolution Date: 11/07/2018 Goal Status: Active TRINI, SOLDO ([id number]) Interventions: Assess ulceration(s) every visit Treatment Activities: Referred to DME Scotland Korver for dressing supplies : 11/07/2018 Skin care regimen initiated : 11/07/2018 Topical wound management initiated : 11/07/2018 Notes: Electronic Signature(s) Signed: 11/07/2018 5:00:13 PM By: Gretta Cool, BSN, RN, CWS, Kim RN, BSN Entered By: Gretta Cool, BSN, RN, CWS, Kim on 11/07/2018  14:41:09 [name] ([id number]) -------------------------------------------------------------------------------- Pain Assessment Details Patient Name: [name]. Date of Service: 11/07/2018 2:15 PM Medical Record Number: [id number] Patient Account Number: [id number] Date of Birth/Sex: 1932/11/15 (85 y.o. F) Treating RN: Montey Hora Primary Care Armie Moren: Ramonita Lab Other Clinician: Referring Torey Regan: Beverly Gust Treating Filbert Craze/Extender: Beverly Gust in Treatment: 0 Active Problems Location of Pain Severity and Description of Pain Patient Has Paino No Site Locations Pain Management and Medication Current Pain Management: Electronic Signature(s) Signed: 11/07/2018 3:05:31 PM By: Montey Hora Entered By: Montey Hora on 11/07/2018 14:20:08 [name] ([id number]) -------------------------------------------------------------------------------- Patient/Caregiver Education Details Patient Name: [name]. Date of Service: 11/07/2018 2:15 PM Medical Record Number: [id number] Patient Account Number: [id number] Date of Birth/Gender: Apr 03, 1932 (85 y.o. F) Treating RN: Cornell Barman Primary Care Physician: Ramonita Lab Other Clinician: Referring Physician: Beverly Gust Treating Physician/Extender: Beverly Gust in Treatment: 0 Education Assessment Education Provided To: Patient Education Topics Provided Welcome To [facility] [place]: Handouts: [facility] Methods: Demonstration, Explain/Verbal Responses: State content correctly Wound/Skin Impairment: Handouts: Caring for Your Ulcer Methods: Demonstration, Explain/Verbal Responses: State  content correctly Electronic Signature(s) Signed: 11/07/2018 5:00:13 PM By: Gretta Cool, BSN, RN, CWS, Kim RN, BSN Entered By: Gretta Cool, BSN, RN, CWS, Kim on 11/07/2018 14:49:03 [name]  ([id number]) -------------------------------------------------------------------------------- Wound Assessment Details Patient Name: [name]. Date of Service: 11/07/2018 2:15 PM Medical Record Number: [id number] Patient Account Number: [id number] Date of Birth/Sex: Nov 21, 1932 (85 y.o. 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 Event: Trauma Wound Status: Open Date Acquired: 10/22/2018 Comorbid History: Type II Diabetes Weeks Of Treatment: 0 Clustered Wound: No Photos Wound Measurements Length: (cm) 2 Width: (cm) 1.8 Depth: (cm) 0.1 Area: (cm) 2.827 Volume: (cm) 0.283 % Reduction in Area: 0% % Reduction in Volume: 0% Epithelialization: None Tunneling: No Undermining: No Wound Description Full Thickness Without Exposed Support Foul Odo Classification: Structures Slough/F Wound Margin: Flat and Intact Exudate Medium Amount: Exudate Type: Serous Exudate Color: amber r After Cleansing: No ibrino Yes Wound Bed Granulation Amount: Small (1-33%) Exposed Structure Granulation Quality: Pink Fat Layer (Subcutaneous Tissue) Exposed: Yes Necrotic Amount: Large (67-100%) Necrotic Quality: Adherent Slough Treatment Notes Wound #1 (Left, Anterior Lower Leg) Vanvoorhis, Byron H. ([id number]) 1. Cleansed with: Clean wound with Normal Saline 2. Anesthetic Topical Lidocaine 4% cream to wound bed prior to debridement 4. Dressing Applied: Aquacel Ag 5. Secondary Dressing Applied ABD and Kerlix/Conform 7. Secured with Tubular bandage Electronic Signature(s) Signed: 11/08/2018 8:55:21 AM By: Gretta Cool, BSN, RN, CWS, Kim RN, BSN Signed: 11/08/2018 4:45:21 PM By: Montey Hora Previous Signature: 11/08/2018 8:53:29 AM Version By: Gretta Cool BSN, RN, CWS, Kim RN, BSN Previous Signature: 11/07/2018 3:05:31 PM Version By: Montey Hora Entered By: Gretta Cool BSN, RN, CWS, Kim on 11/08/2018 08:55:20 KELIA, GIBBON ([id number]) -------------------------------------------------------------------------------- Vitals Details Patient Name: DYAMOND, TOLOSA H. Date of Service: 11/07/2018 2:15 PM Medical Record Number: [id number] Patient Account Number: [id number] Date of Birth/Sex: Feb 24, 1933 (85 y.o. F) Treating RN: Montey Hora Primary Care Logan Vegh: Ramonita Lab Other Clinician: Referring Raysha Tilmon: Beverly Gust Treating Trinton Prewitt/Extender: Beverly Gust in Treatment: 0 Vital Signs Time Taken: 14:20 Temperature (F): 98.7 Height (in): 66 Pulse (bpm): 80 Source: Measured Respiratory Rate (breaths/min): 16 Weight (lbs): 125 Blood Pressure (mmHg): 138/73 Source: Measured Reference Range: 80 - 120 mg / dl Body Mass Index (BMI): 20.2 Electronic Signature(s) Signed: 11/07/2018 3:05:31 PM By: Montey Hora Entered By: Montey Hora on 11/07/2018 14:20:40

## 2018-11-14 ENCOUNTER — Encounter: Payer: Medicare Other | Admitting: Internal Medicine

## 2018-11-14 ENCOUNTER — Other Ambulatory Visit: Payer: Self-pay

## 2018-11-14 DIAGNOSIS — E11622 Type 2 diabetes mellitus with other skin ulcer: Secondary | ICD-10-CM | POA: Diagnosis not present

## 2018-11-15 NOTE — Progress Notes (Signed)
[name], [name] ([id number]) Visit Report for 11/14/2018 HPI Details Patient Name: [name], [name]. Date of Service: 11/14/2018 3:15 PM Medical Record Number: [id number] Patient Account Number: [id number] Date of Birth/Sex: 1932/05/03 (85 y.o. F) Treating RN: Cornell Barman Primary Care Provider: Ramonita Lab Other Clinician: Referring Provider: Ramonita Lab Treating Provider/Extender: Tito Dine in Treatment: 1 History of Present Illness HPI Description: Pleasant 85 year old female who sustained dog scratch injury to the left anterior leg after her son's golden retriever greeted her and accidentally scratched her leg. This happened about 2 weeks ago and since then she has had a flap of skin covering the area she has not been using any particular dressing or ointment to this wound which has a partial layer of skin covering the lower end of it and she decided to come to the wound clinic to get some attention to it. She denies any significant drainage or discomfort or swelling Patient's history is unremarkable for any significant medical issues she is on aspirin and simvastatin prescribed by her son but she essentially has hypertension as her only medical problem. She does take a baby aspirin But is on no other blood thinners ABIs in the clinic today on the left 1.05 8/19; this is a patient with chronic venous insufficiency very fragile skin on her bilateral lower legs. She suffered a dog scratch and was admitted to the clinic last week with a skin tear. Apparently the covering flap was nonviable and was removed. She has a reasonably small clean wound remaining. Electronic Signature(s) Signed: 11/14/2018 5:33:48 PM By: Linton Ham MD Entered By: Linton Ham on 11/14/2018 17:26:40 [name] ([id number]) -------------------------------------------------------------------------------- Physical Exam Details Patient Name: [name]. Date of Service: 11/14/2018 3:15  PM Medical Record Number: [id number] Patient Account Number: [id number] Date of Birth/Sex: 1932/04/13 (85 y.o. F) Treating RN: Cornell Barman Primary Care Provider: Ramonita Lab Other Clinician: Referring Provider: Ramonita Lab Treating Provider/Extender: Ricard Dillon Weeks in Treatment: 1 Constitutional Sitting or standing Blood Pressure is within target range for patient.. Pulse regular and within target range for patient.Marland Kitchen Respirations regular, non-labored and within target range.. Temperature is normal and within the target range for the patient.Marland Kitchen appears in no distress. Respiratory Respiratory effort is easy and symmetric bilaterally. Rate is normal at rest and on room air.. Cardiovascular Pedal pulses palpable and strong bilaterally.. There is no major edema.. Integumentary (Hair, Skin) Very fragile skin bilaterally which I think is probably chronic venous in etiology. Psychiatric No evidence of depression, anxiety, or agitation. Calm, cooperative, and communicative. Appropriate interactions and affect.. Notes Wound exam; left anterior lower leg. The remaining tissue in the wound looks healthy. There is some eschar around the circumference in certain spots but I did not remove this and will not unless this wound stalls. No debridement was required. There is no evidence of surrounding infection Electronic Signature(s) Signed: 11/14/2018 5:33:48 PM By: Linton Ham MD Entered By: Linton Ham on 11/14/2018 17:28:01 [name] ([id number]) -------------------------------------------------------------------------------- Physician Orders Details Patient Name: [name]. Date of Service: 11/14/2018 3:15 PM Medical Record Number: [id number] Patient Account Number: [id number] Date of Birth/Sex: October 29, 1932 (85 y.o. F) Treating RN: Cornell Barman Primary Care Provider: Ramonita Lab Other Clinician: Referring Provider: Ramonita Lab Treating Provider/Extender: Tito Dine in Treatment: 1 Verbal / Phone Orders: No Diagnosis Coding Wound Cleansing Wound #1 Left,Anterior Lower Leg o Clean wound with Normal Saline. Anesthetic (add to Medication List) Wound #1 Left,Anterior Lower Leg o Topical Lidocaine 4%  cream applied to wound bed prior to debridement (In Clinic Only). Primary Wound Dressing Wound #1 Left,Anterior Lower Leg o Silver Alginate Secondary Dressing o ABD and Kerlix/Conform - stretch net to secure Dressing Change Frequency Wound #1 Left,Anterior Lower Leg o Change dressing every other day. Follow-up Appointments Wound #1 Left,Anterior Lower Leg o Return Appointment in 1 week. Edema Control o Elevate legs to the level of the heart and pump ankles as often as possible Electronic Signature(s) Signed: 11/14/2018 5:33:48 PM By: Linton Ham MD Signed: 11/14/2018 5:55:20 PM By: Gretta Cool, BSN, RN, CWS, Kim RN, BSN Entered By: Gretta Cool, BSN, RN, CWS, Kim on 11/14/2018 15:55:23 [name] ([id number]) -------------------------------------------------------------------------------- Problem List Details Patient Name: [name], Gina H. Date of Service: 11/14/2018 3:15 PM Medical Record Number: [id number] Patient Account Number: [id number] Date of Birth/Sex: 10/11/1932 (85 y.o. 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 Date of Service: 11/14/2018 3:15 PM Medical Record Number: [id number] Patient Account Number: [id number] Date of Birth/Sex: 1932/07/25 (85 y.o. F) Treating RN: Cornell Barman Primary Care Provider: Ramonita Lab Other Clinician: Referring Provider: Ramonita Lab Treating Provider/Extender: Tito Dine in Treatment: 1 Subjective History of Present Illness (HPI) Pleasant 85 year old female who sustained dog scratch injury to the left anterior leg after her son's golden retriever greeted her and accidentally scratched her leg. This happened about 2 weeks ago and since then she has had a flap of skin covering the area she has not been using any particular dressing or ointment to this wound which has a partial layer of skin covering the lower end of it and she decided to come to the wound clinic to get some attention to it. She denies any significant drainage or discomfort or swelling Patient's history is unremarkable for any significant medical issues she is on aspirin and simvastatin prescribed by her son but she essentially has hypertension as her only medical problem. She does take a baby aspirin But is on no other blood thinners ABIs in the clinic today on the left 1.05 8/19; this is a patient with chronic venous insufficiency very fragile skin on her bilateral lower legs. She suffered a dog scratch and was admitted to the clinic last week with a skin tear. Apparently the covering flap was nonviable and was removed. She has a reasonably small clean wound remaining. Objective Constitutional Sitting or standing Blood Pressure is within target range for patient.. Pulse regular and within target range for patient.Marland Kitchen Respirations regular, non-labored and within target range.. Temperature is normal and within the target range for the  patient.Marland Kitchen appears in no distress. Vitals Time Taken: 3:12 PM, Height: 66 in, Weight: 125 lbs, BMI: 20.2, Temperature: 98.1 F, Pulse: 75 bpm, Respiratory Rate: 16 breaths/min, Blood Pressure: 116/63 mmHg. Respiratory Respiratory effort is easy and symmetric bilaterally. Rate is normal at rest and on room air.. Cardiovascular Pedal pulses palpable and strong bilaterally.. There is no major edema.Marland Kitchen Psychiatric No evidence of depression, anxiety, or agitation. Calm, cooperative,  and communicative. Appropriate interactions and affect.. General Notes: Wound exam; left anterior lower leg. The remaining tissue in the wound looks healthy. There is some eschar around the circumference in certain spots but I did not remove this and will not unless this wound stalls. No debridement was required. There is no evidence of surrounding infection [name], Gina H. ([id number]) Integumentary (Hair, Skin) Very fragile skin bilaterally which I think is probably chronic venous in etiology. Wound #1 status is Open. Original cause of wound was Trauma. The wound is located on the Left,Anterior Lower Leg. The wound measures 1.5cm length x 1.7cm width x 0.1cm depth; 2.003cm^2 area and 0.2cm^3 volume. There is Fat Layer (Subcutaneous Tissue) Exposed exposed. There is no tunneling or undermining noted. There is a medium amount of serous drainage noted. The wound margin is flat and intact. There is small (1-33%) pink granulation within the wound bed. There is a large (67-100%) amount of necrotic tissue within the wound bed including Adherent Slough. Assessment Active Problems ICD-10 Non-pressure chronic ulcer of left calf limited to breakdown of skin Chronic venous hypertension (idiopathic) without complications of left lower extremity Plan Wound Cleansing: Wound #1 Left,Anterior Lower Leg: Clean wound with Normal Saline. Anesthetic (add to Medication List): Wound #1 Left,Anterior Lower Leg: Topical Lidocaine 4%  cream applied to wound bed prior to debridement (In Clinic Only). Primary Wound Dressing: Wound #1 Left,Anterior Lower Leg: Silver Alginate Secondary Dressing: ABD and Kerlix/Conform - stretch net to secure Dressing Change Frequency: Wound #1 Left,Anterior Lower Leg: Change dressing every other day. Follow-up Appointments: Wound #1 Left,Anterior Lower Leg: Return Appointment in 1 week. Edema Control: Elevate legs to the level of the heart and pump ankles as often as possible 1. We continued with silver alginate 2. Careful attention to the dimensions of the wound next week also the eschar around the wound surface may need to be removed if we do not get contraction in the surface area 3. The area is wrapped with ABDs Kerlix and Coban Electronic Signature(s) [name], [name] ([id number]) Signed: 11/14/2018 5:33:48 PM By: Linton Ham MD Entered By: Linton Ham on 11/14/2018 17:29:05 [name] ([id number]) -------------------------------------------------------------------------------- SuperBill Details Patient Name: [name]. Date of Service: 11/14/2018 Medical Record Number: [id number] Patient Account Number: [id number] Date of Birth/Sex: 01-19-1933 (85 y.o. F) Treating RN: Cornell Barman Primary Care Provider: Ramonita Lab Other Clinician: Referring Provider: Ramonita Lab Treating Provider/Extender: Ricard Dillon Weeks in Treatment: 1 Diagnosis Coding ICD-10 Codes Code Description [id number] Non-pressure chronic ulcer of left calf limited to breakdown of skin I87.302 Chronic venous hypertension (idiopathic) without complications of left lower extremity Physician Procedures CPT4 Code Description: [id number] - WC PHYS LEVEL 3 - EST PT ICD-10 Diagnosis Description L97.221 Non-pressure chronic ulcer of left calf limited to breakdown o I87.302 Chronic venous hypertension (idiopathic) without complications Modifier: f skin of left lower e Quantity: 1  xtremity Electronic Signature(s) Signed: 11/14/2018 5:33:48 PM By: Linton Ham MD Entered By: Linton Ham on 11/14/2018 17:30:00

## 2018-11-15 NOTE — Progress Notes (Signed)
ARLEN, LEGENDRE ([id number]) Visit Report for 11/14/2018 Arrival Information Details Patient Name: [name], [name]. Date of Service: 11/14/2018 3:15 PM Medical Record Number: [id number] Patient Account Number: [id number] Date of Birth/Sex: October 10, 1932 (85 y.o. F) Treating RN: Harold Barban Primary Care Surabhi Gadea: Ramonita Lab Other Clinician: Referring Meylin Stenzel: Ramonita Lab Treating Ilma Achee/Extender: Tito Dine in Treatment: 1 Visit Information History Since Last Visit Added or deleted any medications: No Patient Arrived: Ambulatory Any new allergies or adverse reactions: No Arrival Time: 15:11 Had a fall or experienced change in No Accompanied By: husband activities of daily living that may affect Transfer Assistance: None risk of falls: Patient Identification Verified: Yes Signs or symptoms of abuse/neglect since last visito No Secondary Verification Process Completed: Yes Hospitalized since last visit: No Patient Has Alerts: Yes Has Dressing in Place as Prescribed: Yes Patient Alerts: DMII Pain Present Now: No Electronic Signature(s) Signed: 11/14/2018 4:11:20 PM By: Harold Barban Entered By: Harold Barban on 11/14/2018 15:12:02 [place], Germain Osgood ([id number]) -------------------------------------------------------------------------------- Clinic Level of Care Assessment Details Patient Name: [name]. Date of Service: 11/14/2018 3:15 PM Medical Record Number: [id number] Patient Account Number: [id number] Date of Birth/Sex: Aug 15, 1932 (85 y.o. F) Treating RN: Cornell Barman Primary Care Billyjack Trompeter: Ramonita Lab Other Clinician: Referring Ramirez Fullbright: Ramonita Lab Treating Keesha Pellum/Extender: Tito Dine in Treatment: 1 Clinic Level of Care Assessment Items TOOL 4 Quantity Score []  - Use when only an EandM is performed on FOLLOW-UP visit 0 ASSESSMENTS - Nursing Assessment / Reassessment []  - Reassessment of Co-morbidities (includes updates in patient  status) 0 X- 1 5 Reassessment of Adherence to Treatment Plan ASSESSMENTS - Wound and Skin Assessment / Reassessment []  - Simple Wound Assessment / Reassessment - one wound 0 X- 1 5 Complex Wound Assessment / Reassessment - multiple wounds []  - 0 Dermatologic / Skin Assessment (not related to wound area) ASSESSMENTS - Focused Assessment []  - Circumferential Edema Measurements - multi extremities 0 []  - 0 Nutritional Assessment / Counseling / Intervention []  - 0 Lower Extremity Assessment (monofilament, tuning fork, pulses) []  - 0 Peripheral Arterial Disease Assessment (using hand held doppler) ASSESSMENTS - Ostomy and/or Continence Assessment and Care []  - Incontinence Assessment and Management 0 []  - 0 Ostomy Care Assessment and Management (repouching, etc.) PROCESS - Coordination of Care X - Simple Patient / Family Education for ongoing care 1 15 []  - 0 Complex (extensive) Patient / Family Education for ongoing care []  - 0 Staff obtains Programmer, systems, Records, Test Results / Process Orders []  - 0 Staff telephones HHA, Nursing Homes / Clarify orders / etc []  - 0 Routine Transfer to another Facility (non-emergent condition) []  - 0 Routine Hospital Admission (non-emergent condition) []  - 0 New Admissions / Biomedical engineer / Ordering NPWT, Apligraf, etc. []  - 0 Emergency Hospital Admission (emergent condition) X- 1 10 Simple Discharge Coordination Golay, Jenina H. ([id number]) []  - 0 Complex (extensive) Discharge Coordination PROCESS - Special Needs []  - Pediatric / Minor Patient Management 0 []  - 0 Isolation Patient Management []  - 0 Hearing / Language / Visual special needs []  - 0 Assessment of Community assistance (transportation, D/C planning, etc.) []  - 0 Additional assistance / Altered mentation []  - 0 Support Surface(s) Assessment (bed, cushion, seat, etc.) INTERVENTIONS - Wound Cleansing / Measurement X - Simple Wound Cleansing - one wound 1 5 []  -  0 Complex Wound Cleansing - multiple wounds X- 1 5 Wound Imaging (photographs - any number of wounds) []  - 0 Wound Tracing (instead of photographs)  X- 1 5 Simple Wound Measurement - one wound []  - 0 Complex Wound Measurement - multiple wounds INTERVENTIONS - Wound Dressings []  - Small Wound Dressing one or multiple wounds 0 X- 1 15 Medium Wound Dressing one or multiple wounds []  - 0 Large Wound Dressing one or multiple wounds []  - 0 Application of Medications - topical []  - 0 Application of Medications - injection INTERVENTIONS - Miscellaneous []  - External ear exam 0 []  - 0 Specimen Collection (cultures, biopsies, blood, body fluids, etc.) []  - 0 Specimen(s) / Culture(s) sent or taken to Lab for analysis []  - 0 Patient Transfer (multiple staff / Civil Service fast streamer / Similar devices) []  - 0 Simple Staple / Suture removal (25 or less) []  - 0 Complex Staple / Suture removal (26 or more) []  - 0 Hypo / Hyperglycemic Management (close monitor of Blood Glucose) []  - 0 Ankle / Brachial Index (ABI) - do not check if billed separately X- 1 5 Vital Signs Evett, Missy H. ([id number]) Has the patient been seen at the hospital within the last three years: Yes Total Score: 70 Level Of Care: New/Established - Level 2 Electronic Signature(s) Signed: 11/14/2018 5:55:20 PM By: Gretta Cool, BSN, RN, CWS, Kim RN, BSN Entered By: Gretta Cool, BSN, RN, CWS, Kim on 11/14/2018 17:47:59 [name] ([id number]) -------------------------------------------------------------------------------- Encounter Discharge Information Details Patient Name: [name]. Date of Service: 11/14/2018 3:15 PM Medical Record Number: [id number] Patient Account Number: [id number] Date of Birth/Sex: Aug 01, 1932 (85 y.o. F) Treating RN: Cornell Barman Primary Care Ezabella Teska: Ramonita Lab Other Clinician: Referring Melissa Tomaselli: Ramonita Lab Treating Randell Detter/Extender: Tito Dine in Treatment: 1 Encounter Discharge  Information Items Discharge Condition: Stable Ambulatory Status: Ambulatory Discharge Destination: Home Transportation: Private Auto Accompanied By: husband Schedule Follow-up Appointment: Yes Clinical Summary of Care: Electronic Signature(s) Signed: 11/14/2018 5:55:20 PM By: Gretta Cool, BSN, RN, CWS, Kim RN, BSN Entered By: Gretta Cool, BSN, RN, CWS, Kim on 11/14/2018 16:00:32 [name] ([id number]) -------------------------------------------------------------------------------- Lower Extremity Assessment Details Patient Name: Flamm, Juanna H. Date of Service: 11/14/2018 3:15 PM Medical Record Number: [id number] Patient Account Number: [id number] Date of Birth/Sex: [birth year] (85 y.o. F) Treating RN: Harold Barban Primary Care Anhelica Fowers: Ramonita Lab Other Clinician: Referring Tallula Grindle: Ramonita Lab Treating Claudine Stallings/Extender: Ricard Dillon Weeks in Treatment: 1 Edema Assessment Assessed: [Left: No] [Right: No] [Left: Edema] [Right: :] Calf Left: Right: Point of Measurement: 30 cm From Medial Instep 31.3 cm cm Ankle Left: Right: Point of Measurement: 10 cm From Medial Instep 19 cm cm Vascular Assessment Pulses: Dorsalis Pedis Palpable: [Left:Yes] Posterior Tibial Palpable: [Left:Yes] Electronic Signature(s) Signed: 11/14/2018 4:11:20 PM By: Harold Barban Entered By: Harold Barban on 11/14/2018 15:18:53 [place], Germain Osgood ([id number]) -------------------------------------------------------------------------------- Multi Wound Chart Details Patient Name: [name]. Date of Service: 11/14/2018 3:15 PM Medical Record Number: [id number] Patient Account Number: [id number] Date of Birth/Sex: [birth year] (85 y.o. F) Treating RN: Cornell Barman Primary Care Chanoch Mccleery: Ramonita Lab Other Clinician: Referring Ash Mcelwain: Ramonita Lab Treating Ramonte Mena/Extender: Ricard Dillon Weeks in Treatment: 1 Vital Signs Height(in): 66 Pulse(bpm): 75 Weight(lbs): 125 Blood Pressure(mmHg):  116/63 Body Mass Index(BMI): 20 Temperature(F): 98.1 Respiratory Rate 16 (breaths/min): Photos: [N/A:N/A] Wound Location: Left Lower Leg - Anterior N/A N/A Wounding Event: Trauma N/A N/A Primary Etiology: Skin Tear N/A N/A Secondary Etiology: Diabetic Wound/Ulcer of the N/A N/A Lower Extremity Comorbid History: Type II Diabetes N/A N/A Date Acquired: 10/22/2018 N/A N/A Weeks of Treatment: 1 N/A N/A Wound Status: Open N/A N/A Measurements L x W x D 1.5x1.7x0.1 N/A  N/A (cm) Area (cm) : 2.003 N/A N/A Volume (cm) : 0.2 N/A N/A % Reduction in Area: 29.10% N/A N/A % Reduction in Volume: 29.30% N/A N/A Classification: Full Thickness Without N/A N/A Exposed Support Structures Exudate Amount: Medium N/A N/A Exudate Type: Serous N/A N/A Exudate Color: amber N/A N/A Wound Margin: Flat and Intact N/A N/A Granulation Amount: Small (1-33%) N/A N/A Granulation Quality: Pink N/A N/A Necrotic Amount: Large (67-100%) N/A N/A Exposed Structures: Fat Layer (Subcutaneous N/A N/A Tissue) Exposed: Yes Epithelialization: None N/A N/A MABRY, SANTARELLI ([id number]) Treatment Notes Electronic Signature(s) Signed: 11/14/2018 5:55:20 PM By: Gretta Cool, BSN, RN, CWS, Kim RN, BSN Entered By: Gretta Cool, BSN, RN, CWS, Kim on 11/14/2018 15:54:02 [name] ([id number]) -------------------------------------------------------------------------------- Multi-Disciplinary Care Plan Details Patient Name: [name]. Date of Service: 11/14/2018 3:15 PM Medical Record Number: [id number] Patient Account Number: [id number] Date of Birth/Sex: 28-Jul-1932 (85 y.o. F) Treating RN: Cornell Barman Primary Care Edrian Melucci: Ramonita Lab Other Clinician: Referring Tramane Gorum: Ramonita Lab Treating Nikiah Goin/Extender: Tito Dine in Treatment: 1 Active Inactive Necrotic Tissue Nursing Diagnoses: Impaired tissue integrity related to necrotic/devitalized tissue Goals: Necrotic/devitalized tissue will be minimized in  the wound bed Date Initiated: 11/07/2018 Target Resolution Date: 11/15/2018 Goal Status: Active Interventions: Assess patient pain level pre-, during and post procedure and prior to discharge Treatment Activities: Excisional debridement : 11/07/2018 Notes: Orientation to the Wound Care Program Nursing Diagnoses: Knowledge deficit related to the wound healing center program Goals: Patient/caregiver will verbalize understanding of the [place] Date Initiated: 11/07/2018 Target Resolution Date: 11/07/2018 Goal Status: Active Interventions: Provide education on orientation to the wound center Notes: Wound/Skin Impairment Nursing Diagnoses: Impaired tissue integrity Goals: Patient/caregiver will verbalize understanding of skin care regimen Date Initiated: 11/07/2018 Target Resolution Date: 11/07/2018 Goal Status: Active EVEREST, HACKING ([id number]) Interventions: Assess ulceration(s) every visit Treatment Activities: Referred to DME [place] for dressing supplies : 11/07/2018 Skin care regimen initiated : 11/07/2018 Topical wound management initiated : 11/07/2018 Notes: Electronic Signature(s) Signed: 11/14/2018 5:55:20 PM By: Gretta Cool, BSN, RN, CWS, Kim RN, BSN Entered By: Gretta Cool, BSN, RN, CWS, Kim on 11/14/2018 15:53:54 [place], Germain Osgood ([id number]) -------------------------------------------------------------------------------- Pain Assessment Details Patient Name: [name]. Date of Service: 11/14/2018 3:15 PM Medical Record Number: [id number] Patient Account Number: [id number] Date of Birth/Sex: July 16, 1932 (85 y.o. F) Treating RN: Harold Barban Primary Care Eirene Rather: Ramonita Lab Other Clinician: Referring Nikolaos Maddocks: Ramonita Lab Treating Jaja Switalski/Extender: Ricard Dillon Weeks in Treatment: 1 Active Problems Location of Pain Severity and Description of Pain Patient Has Paino No Site Locations Pain Management and Medication Current Pain  Management: Electronic Signature(s) Signed: 11/14/2018 4:11:20 PM By: Harold Barban Entered By: Harold Barban on 11/14/2018 15:12:34 [name] ([id number]) -------------------------------------------------------------------------------- Patient/Caregiver Education Details Patient Name: [name]. Date of Service: 11/14/2018 3:15 PM Medical Record Number: [id number] Patient Account Number: [id number] Date of Birth/Gender: Aug 04, 1932 (85 y.o. F) Treating RN: Cornell Barman Primary Care Physician: Ramonita Lab Other Clinician: Referring Physician: Ramonita Lab Treating Physician/Extender: Tito Dine in Treatment: 1 Education Assessment Education Provided To: Patient Education Topics Provided Welcome To [facility]: Handouts: Welcome To [facility] Methods: Demonstration, Explain/Verbal Responses: State content correctly Wound/Skin Impairment: Handouts: Caring for Your Ulcer Methods: Demonstration, Explain/Verbal Responses: State content correctly Electronic Signature(s) Signed: 11/14/2018 5:55:20 PM By: Gretta Cool, BSN, RN, CWS, Kim RN, BSN Entered By: Gretta Cool, BSN, RN, CWS, Kim on 11/14/2018 15:55:51 MYANA, SCHLUP ([id number]) -------------------------------------------------------------------------------- Wound Assessment Details Patient Name: Delos Haring  H. Date of Service: 11/14/2018 3:15 PM Medical Record Number: [id number] Patient Account Number: [id number] Date of Birth/Sex: 1932/11/12 (85 y.o. F) Treating RN: Harold Barban Primary Care Hila Bolding: Ramonita Lab Other Clinician: Referring Yerachmiel Spinney: Ramonita Lab Treating Tamaria Dunleavy/Extender: Ricard Dillon Weeks in Treatment: 1 Wound Status Wound Number: 1 Primary Etiology: Skin Tear Wound Location: Left Lower Leg - Anterior Secondary Diabetic Wound/Ulcer of the Lower Etiology: Extremity Wounding Event: Trauma Wound Status: Open Date Acquired: 10/22/2018 Comorbid History: Type II  Diabetes Weeks Of Treatment: 1 Clustered Wound: No Photos Wound Measurements Length: (cm) 1.5 Width: (cm) 1.7 Depth: (cm) 0.1 Area: (cm) 2.003 Volume: (cm) 0.2 % Reduction in Area: 29.1% % Reduction in Volume: 29.3% Epithelialization: None Tunneling: No Undermining: No Wound Description Full Thickness Without Exposed Support Classification: Structures Wound Margin: Flat and Intact Exudate Medium Amount: Exudate Type: Serous Exudate Color: amber Foul Odor After Cleansing: No Slough/Fibrino Yes Wound Bed Granulation Amount: Small (1-33%) Exposed Structure Granulation Quality: Pink Fat Layer (Subcutaneous Tissue) Exposed: Yes Necrotic Amount: Large (67-100%) Necrotic Quality: Adherent Therapist, music) Signed: 11/14/2018 4:11:20 PM By: Raynelle Jan, Germain Osgood ([id number]) Entered By: Harold Barban on 11/14/2018 15:17:42 [place], Germain Osgood ([id number]) -------------------------------------------------------------------------------- Vitals Details Patient Name: [name]. Date of Service: 11/14/2018 3:15 PM Medical Record Number: [id number] Patient Account Number: [id number] Date of Birth/Sex: 1933-01-23 (85 y.o. F) Treating RN: Harold Barban Primary Care Marcayla Budge: Ramonita Lab Other Clinician: Referring Malaika Arnall: Ramonita Lab Treating Alexi Geibel/Extender: Tito Dine in Treatment: 1 Vital Signs Time Taken: 15:12 Temperature (F): 98.1 Height (in): 66 Pulse (bpm): 75 Weight (lbs): 125 Respiratory Rate (breaths/min): 16 Body Mass Index (BMI): 20.2 Blood Pressure (mmHg): 116/63 Reference Range: 80 - 120 mg / dl Electronic Signature(s) Signed: 11/14/2018 4:11:20 PM By: Harold Barban Entered By: Harold Barban on 11/14/2018 15:13:04

## 2018-11-20 ENCOUNTER — Other Ambulatory Visit: Payer: Self-pay | Admitting: Internal Medicine

## 2018-11-20 DIAGNOSIS — Z1231 Encounter for screening mammogram for malignant neoplasm of breast: Secondary | ICD-10-CM

## 2018-11-21 ENCOUNTER — Other Ambulatory Visit: Payer: Self-pay

## 2018-11-21 ENCOUNTER — Encounter: Payer: Medicare Other | Admitting: Internal Medicine

## 2018-11-21 DIAGNOSIS — E11622 Type 2 diabetes mellitus with other skin ulcer: Secondary | ICD-10-CM | POA: Diagnosis not present

## 2018-11-21 NOTE — Progress Notes (Signed)
SYMARA, BOCCUZZI ([id number]) Visit Report for 11/21/2018 Arrival Information Details Patient Name: [name], [name]. Date of Service: 11/21/2018 8:45 AM Medical Record Number: [id number] Patient Account Number: [id number] Date of Birth/Sex: [birth year] (85 y.o. F) Treating RN: Army Melia Primary Care Fedora Knisely: [facility] Other Clinician: Referring Karinda Cabriales: [facility]on[facility] Treating Jahquan Klugh/Extender: Tito Dine in Treatment: 2 Visit Information History Since Last Visit Added or deleted any medications: No Patient Arrived: Ambulatory Any new allergies or adverse reactions: No Arrival Time: 08:42 Had a fall or experienced change in No Accompanied By: self activities of daily living that may affect Transfer Assistance: None risk of falls: Patient Has Alerts: Yes Signs or symptoms of abuse/neglect since last visito No Patient Alerts: DMII Hospitalized since last visit: No Has Dressing in Place as Prescribed: Yes Pain Present Now: No Electronic Signature(s) Signed: 11/21/2018 11:16:42 AM By: Army Melia Entered By: Army Melia on 11/21/2018 08:42:53 [name], [name] ([id number]) -------------------------------------------------------------------------------- Encounter Discharge Information Details Patient Name: [name]. Date of Service: 11/21/2018 8:45 AM Medical Record Number: [id number] Patient Account Number: [id number] Date of Birth/Sex: [birth year] (85 y.o. F) Treating RN: Montey Hora Primary Care Antoney Biven: [facility] Other Clinician: Referring Rudy Luhmann: [facility] Treating Gean Laursen/Extender: Tito Dine in Treatment: 2 Encounter Discharge Information Items Post Procedure Vitals Discharge Condition: Stable Temperature (F): 98.6 Ambulatory Status: Ambulatory Pulse (bpm): 71 Discharge Destination: Home Respiratory Rate (breaths/min): 16 Transportation: Private Auto Blood Pressure (mmHg): 142/65 Accompanied By: spouse Schedule Follow-up  Appointment: Yes Clinical Summary of Care: Electronic Signature(s) Signed: 11/21/2018 10:56:25 AM By: Montey Hora Entered By: Montey Hora on 11/21/2018 10:56:25 Byard, [name] ([id number]) -------------------------------------------------------------------------------- Lower Extremity Assessment Details Patient Name: [name]. Date of Service: 11/21/2018 8:45 AM Medical Record Number: [id number] Patient Account Number: [id number] Date of Birth/Sex: 20-Feb-1933 (85 y.o. F) Treating RN: Army Melia Primary Care Courtni Balash: [facility] Other Clinician: Referring Cheresa Siers: [facility]ita Lab Treating Tyshay Adee/Extender: Ricard Dillon Weeks in Treatment: 2 Edema Assessment Assessed: [Left: No] [Right: No] Edema: [Left: N] [Right: o] Vascular Assessment Pulses: Dorsalis Pedis Palpable: [Left:Yes] Electronic Signature(s) Signed: 11/21/2018 11:16:42 AM By: Army Melia Entered By: Army Melia on 11/21/2018 08:47:30 Toor, Chayil Lemmie Evens ([id number]) -------------------------------------------------------------------------------- Multi Wound Chart Details Patient Name: Gina Haring H. Date of Service: 11/21/2018 8:45 AM Medical Record Number: [id number] Patient Account Number: [id number] Date of Birth/Sex: [birth year] (85 y.o. F) Treating RN: Harold Barban Primary Care Laquashia Mergenthaler: [facility] Other Clinician: Referring Eilan Mcinerny: [facility]onita Lab Treating Arissa Fagin/Extender: Tito Dine in Treatment: 2 Vital Signs Height(in): 66 Pulse(bpm): 71 Weight(lbs): 125 Blood Pressure(mmHg): 142/65 Body Mass Index(BMI): 20 Temperature(F): 98.6 Respiratory Rate 16 (breaths/min): Photos: [N/A:N/A] Wound Location: Left Lower Leg - Anterior N/A N/A Wounding Event: Trauma N/A N/A Primary Etiology: Skin Tear N/A N/A Secondary Etiology: Diabetic Wound/Ulcer of the N/A N/A Lower Extremity Comorbid History: Type II Diabetes N/A N/A Date Acquired: 10/22/2018 N/A N/A Weeks of Treatment: 2  N/A N/A Wound Status: Open N/A N/A Measurements L x W x D 1.8x1.8x0.1 N/A N/A (cm) Area (cm) : 2.545 N/A N/A Volume (cm) : 0.254 N/A N/A % Reduction in Area: 10.00% N/A N/A % Reduction in Volume: 10.20% N/A N/A Classification: Full Thickness Without N/A N/A Exposed Support Structures Exudate Amount: Medium N/A N/A Exudate Type: Serous N/A N/A Exudate Color: amber N/A N/A Wound Margin: Flat and Intact N/A N/A Granulation Amount: Small (1-33%) N/A N/A Granulation Quality: Pink N/A N/A Necrotic Amount: Large (67-100%) N/A N/A Exposed Structures: Fat Layer (Subcutaneous N/A N/A Tissue)  Exposed: Yes Epithelialization: None N/A N/A [name], [name] ([id number]) Treatment Notes Electronic Signature(s) Signed: 11/21/2018 4:23:12 PM By: Harold Barban Entered By: Harold Barban on 11/21/2018 09:00:16 [name] ([id number]) -------------------------------------------------------------------------------- Multi-Disciplinary Care Plan Details Patient Name: [name]. Date of Service: 11/21/2018 8:45 AM Medical Record Number: [id number] Patient Account Number: [id number] Date of Birth/Sex: 1932/12/24 (85 y.o. F) Treating RN: Harold Barban Primary Care Kaiyan Luczak: [facility] Lab Other Clinician: Referring Mikai Meints: Ramonita Lab Treating Isrrael Fluckiger/Extender: Tito Dine in Treatment: 2 Active Inactive Necrotic Tissue Nursing Diagnoses: Impaired tissue integrity related to necrotic/devitalized tissue Goals: Necrotic/devitalized tissue will be minimized in the wound bed Date Initiated: 11/07/2018 Target Resolution Date: 11/15/2018 Goal Status: Active Interventions: Assess patient pain level pre-, during and post procedure and prior to discharge Treatment Activities: Excisional debridement : 11/07/2018 Notes: Orientation to the Wound Care Program Nursing Diagnoses: Knowledge deficit related to the wound healing center program Goals: Patient/caregiver will verbalize  understanding of the [place] Date Initiated: 11/07/2018 Target Resolution Date: 11/07/2018 Goal Status: Active Interventions: Provide education on orientation to the wound center Notes: Wound/Skin Impairment Nursing Diagnoses: Impaired tissue integrity Goals: Patient/caregiver will verbalize understanding of skin care regimen Date Initiated: 11/07/2018 Target Resolution Date: 11/07/2018 Goal Status: Active [name], [name] ([id number]) Interventions: Assess ulceration(s) every visit Treatment Activities: Referred to DME Jaidence Geisler for dressing supplies : 11/07/2018 Skin care regimen initiated : 11/07/2018 Topical wound management initiated : 11/07/2018 Notes: Electronic Signature(s) Signed: 11/21/2018 4:23:12 PM By: Harold Barban Entered By: Harold Barban on 11/21/2018 08:58:45 [name], [name] ([id number]) -------------------------------------------------------------------------------- Pain Assessment Details Patient Name: [name]. Date of Service: 11/21/2018 8:45 AM Medical Record Number: [id number] Patient Account Number: [id number] Date of Birth/Sex: 1932-08-11 (85 y.o. F) Treating RN: Army Melia Primary Care Abdikadir Fohl: [facility] Other Clinician: Referring Evette Diclemente: Ramonita Lab Treating Tommye Lehenbauer/Extender: Ricard Dillon Weeks in Treatment: 2 Active Problems Location of Pain Severity and Description of Pain Patient Has Paino No Site Locations Pain Management and Medication Current Pain Management: Electronic Signature(s) Signed: 11/21/2018 11:16:42 AM By: Army Melia Entered By: Army Melia on 11/21/2018 08:43:09 [name] ([id number]) -------------------------------------------------------------------------------- Patient/Caregiver Education Details Patient Name: [name]. Date of Service: 11/21/2018 8:45 AM Medical Record Number: [id number] Patient Account Number: [id number] Date of Birth/Gender: 24-Oct-1932 (85 y.o.  F) Treating RN: Harold Barban Primary Care Physician: Ramonita [facility] Other Clinician: Referring Physician: Ramonita Lab Treating Physician/Extender: Tito Dine in Treatment: 2 Education Assessment Education Provided To: Patient Education Topics Provided Wound/Skin Impairment: Handouts: Caring for Your Ulcer Methods: Demonstration, Explain/Verbal Responses: State content correctly Electronic Signature(s) Signed: 11/21/2018 4:23:12 PM By: Harold Barban Entered By: Harold Barban on 11/21/2018 09:02:56 [name], [name] ([id number]) -------------------------------------------------------------------------------- Wound Assessment Details Patient Name: Gina Haring H. Date of Service: 11/21/2018 8:45 AM Medical Record Number: [id number] Patient Account Number: [id number] Date of Birth/Sex: 02-06-1933 (85 y.o. 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 Event: Trauma Wound Status: Open Date Acquired: 10/22/2018 Comorbid History: Type II Diabetes Weeks Of Treatment: 2 Clustered Wound: No Photos Wound Measurements Length: (cm) 1.8 Width: (cm) 1.8 Depth: (cm) 0.1 Area: (cm) 2.545 Volume: (cm) 0.254 % Reduction in Area: 10% % Reduction in Volume: 10.2% Epithelialization: None Tunneling: No Undermining: No Wound Description Full  Thickness Without Exposed Support Classification: Structures Wound Margin: Flat and Intact Exudate Medium Amount: Exudate Type: Serous Exudate Color: amber Foul Odor After Cleansing: No Slough/Fibrino Yes Wound Bed Granulation Amount: Small (1-33%) Exposed Structure Granulation Quality: Pink Fat Layer (Subcutaneous Tissue)  Exposed: Yes Necrotic Amount: Large (67-100%) Necrotic Quality: Adherent Slough Treatment Notes Wound #1 (Left, Anterior Lower Leg) [name], Gina H. ([id number]) Notes hydrofera blue, abd, conform secured with netting Electronic Signature(s) Signed: 11/21/2018 11:16:42 AM By: Army Melia Entered By: Army Melia on 11/21/2018 08:46:55 [name], [name] ([id number]) -------------------------------------------------------------------------------- Vitals Details Patient Name: [name]. Date of Service: 11/21/2018 8:45 AM Medical Record Number: [id number] Patient Account Number: [id number] Date of Birth/Sex: 08/19/1932 (85 y.o. F) Treating RN: Army Melia Primary Care Jo-Anne Kluth: [facility]onita Lab Other Clinician: Referring Ayce Pietrzyk: Ramonita Lab Treating Jalien Weakland/Extender: Tito Dine in Treatment: 2 Vital Signs Time Taken: 08:43 Temperature (F): 98.6 Height (in): 66 Pulse (bpm): 71 Weight (lbs): 125 Respiratory Rate (breaths/min): 16 Body Mass Index (BMI): 20.2 Blood Pressure (mmHg): 142/65 Reference Range: 80 - 120 mg / dl Electronic Signature(s) Signed: 11/21/2018 11:16:42 AM By: Army Melia Entered By: Army Melia on 11/21/2018 08:43:29

## 2018-11-21 NOTE — Progress Notes (Signed)
[name], [name] ([id number]) Visit Report for 11/21/2018 Debridement Details Patient Name: [name], [name]. Date of Service: 11/21/2018 8:45 AM Medical Record Number: [id number] Patient Account Number: [id number] Date of Birth/Sex: [birth year] (85 y.o. F) Treating RN: Harold Barban Primary Care Provider: Ramonita Lab Other Clinician: Referring Provider: Ramonita Lab Treating Provider/Extender: Tito Dine in Treatment: 2 Debridement Performed for Wound #1 Left,Anterior Lower Leg Assessment: Performed By: Physician Ricard Dillon, MD Debridement Type: Debridement Severity of Tissue Pre Fat layer exposed Debridement: Level of Consciousness (Pre- Awake and Alert procedure): Pre-procedure Verification/Time Yes - 09:00 Out Taken: Start Time: 09:00 Total Area Debrided (L x W): 1.8 (cm) x 1.8 (cm) = 3.24 (cm) Tissue and other material Viable, Non-Viable, Eschar, Subcutaneous debrided: Level: Skin/Subcutaneous Tissue Debridement Description: Excisional Instrument: Curette Bleeding: None End Time: 09:01 Procedural Pain: 0 Post Procedural Pain: 0 Response to Treatment: Procedure was tolerated well Level of Consciousness Awake and Alert (Post-procedure): Post Debridement Measurements of Total Wound Length: (cm) 1.8 Width: (cm) 1.8 Depth: (cm) 0.1 Volume: (cm) 0.254 Character of Wound/Ulcer Post Debridement: Improved Severity of Tissue Post Debridement: Fat layer exposed Post Procedure Diagnosis Same as Pre-procedure Electronic Signature(s) Signed: 11/21/2018 4:23:12 PM By: Harold Barban Signed: 11/21/2018 4:56:01 PM By: Linton Ham MD Entered By: Harold Barban on 11/21/2018 09:01:56 [name] ([id number]) -------------------------------------------------------------------------------- HPI Details Patient Name: [name]. Date of Service: 11/21/2018 8:45 AM Medical Record Number: [id number] Patient Account Number: [id number] Date of  Birth/Sex: 01-27-1933 (85 y.o. F) Treating RN: Harold Barban Primary Care Provider: Ramonita Lab Other Clinician: Referring Provider: Ramonita Lab Treating Provider/Extender: Tito Dine in Treatment: 2 History of Present Illness HPI Description: Pleasant 85 year old female who sustained dog scratch injury to the left anterior leg after her son's golden retriever greeted her and accidentally scratched her leg. This happened about 2 weeks ago and since then she has had a flap of skin covering the area she has not been using any particular dressing or ointment to this wound which has a partial layer of skin covering the lower end of it and she decided to come to the wound clinic to get some attention to it. She denies any significant drainage or discomfort or swelling Patient's history is unremarkable for any significant medical issues she is on aspirin and simvastatin prescribed by her son but she essentially has hypertension as her only medical problem. She does take a baby aspirin But is on no other blood thinners ABIs in the clinic today on the left 1.05 8/19; this is a patient with chronic venous insufficiency very fragile skin on her bilateral lower legs. She suffered a dog scratch and was admitted to the clinic last week with a skin tear. Apparently the covering flap was nonviable and was removed. She has a reasonably small clean wound remaining. 8/26; patient wound is not as viable in terms of surface we have been using silver alginate and the patient reports this got stuck to the underlying tissue when she change the dressing. Past medical history updated to note that she is a type II diabetic on oral agents Electronic Signature(s) Signed: 11/21/2018 4:56:01 PM By: Linton Ham MD Entered By: Linton Ham on 11/21/2018 09:07:53 [name] ([id number]) -------------------------------------------------------------------------------- Physical Exam Details Patient  Name: Gina Haring H. Date of Service: 11/21/2018 8:45 AM Medical Record Number: [id number] Patient Account Number: [id number] Date of Birth/Sex: 09/14/1932 (85 y.o. F) Treating RN: Harold Barban Primary Care Provider: Ramonita Lab Other Clinician: Referring Provider: Ramonita Lab  Treating Provider/Extender: Ricard Dillon Weeks in Treatment: 2 Constitutional Sitting or standing Blood Pressure is within target range for patient.. Pulse regular and within target range for patient.Marland Kitchen Respirations regular, non-labored and within target range.. Temperature is normal and within the target range for the patient.Marland Kitchen appears in no distress. Notes Wound exam; left anterior lower leg. Not as healthy as surface today. Using a #5 curette I removed eschar from the wound circumference as well as necrotic debris from the wound surface. This had a gritty sound with debridement suggestive of a biofilm which will require debridement. There is no evidence of surrounding infection Electronic Signature(s) Signed: 11/21/2018 4:56:01 PM By: Linton Ham MD Entered By: Linton Ham on 11/21/2018 09:08:48 [name] ([id number]) -------------------------------------------------------------------------------- Physician Orders Details Patient Name: [name]. Date of Service: 11/21/2018 8:45 AM Medical Record Number: [id number] Patient Account Number: [id number] Date of Birth/Sex: 08-08-1932 (85 y.o. F) Treating RN: Harold Barban Primary Care Provider: Ramonita Lab Other Clinician: Referring Provider: Ramonita Lab Treating Provider/Extender: Tito Dine in Treatment: 2 Verbal / Phone Orders: No Diagnosis Coding Wound Cleansing Wound #1 Left,Anterior Lower Leg o Clean wound with Normal Saline. Anesthetic (add to Medication List) Wound #1 Left,Anterior Lower Leg o Topical Lidocaine 4% cream applied to wound bed prior to debridement (In Clinic Only). Primary Wound Dressing Wound  #1 Left,Anterior Lower Leg o Hydrafera Blue Ready Transfer Secondary Dressing o ABD and Kerlix/Conform - stretch net to secure Dressing Change Frequency Wound #1 Left,Anterior Lower Leg o Change dressing every other day. Follow-up Appointments Wound #1 Left,Anterior Lower Leg o Return Appointment in 1 week. Edema Control o Elevate legs to the level of the heart and pump ankles as often as possible Electronic Signature(s) Signed: 11/21/2018 4:23:12 PM By: Harold Barban Signed: 11/21/2018 4:56:01 PM By: Linton Ham MD Entered By: Harold Barban on 11/21/2018 09:03:32 [name] ([id number]) -------------------------------------------------------------------------------- Problem List Details Patient Name: [name]. Date of Service: 11/21/2018 8:45 AM Medical Record Number: [id number] Patient Account Number: [id number] Date of Birth/Sex: May 06, 1932 (85 y.o. 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 Date of Service: 11/21/2018 8:45 AM Medical Record Number: [id number] Patient Account Number: [id number] Date of Birth/Sex: [birth year]  (85 y.o. F) Treating RN: Harold Barban Primary Care Provider: Ramonita Lab Other Clinician: Referring Provider: Ramonita Lab Treating Provider/Extender: Tito Dine in Treatment: 2 Subjective History of Present Illness (HPI) Pleasant 85 year old female who sustained dog scratch injury to the left anterior leg after her son's golden retriever greeted her and accidentally scratched her leg. This happened about 2 weeks ago and since then she has had a flap of skin covering the area she has not been using any particular dressing or ointment to this wound which has a partial layer of skin covering the lower end of it and she decided to come to the wound clinic to get some attention to  it. She denies any significant drainage or discomfort or swelling Patient's history is unremarkable for any significant medical issues she is on aspirin and simvastatin prescribed by her son but she essentially has hypertension as her only medical problem. She does take a baby aspirin But is on no other blood thinners ABIs in the clinic today on the left 1.05 8/19; this is a patient with chronic venous insufficiency very fragile skin on her bilateral lower legs. She suffered a dog scratch and was admitted to the clinic last week with a skin tear. Apparently the covering flap was nonviable and was removed. She has a reasonably small clean wound remaining. 8/26; patient wound is not as viable in terms of surface we have been using silver alginate and the patient reports this got stuck to the underlying tissue when she change the dressing. Past medical history updated to note that she is a type II diabetic on oral agents Objective Constitutional Sitting or standing Blood Pressure is within target range for patient.. Pulse regular and within target range for patient.Marland Kitchen Respirations regular, non-labored and within target range.. Temperature is normal and within the target range for the patient.Marland Kitchen appears in no  distress. Vitals Time Taken: 8:43 AM, Height: 66 in, Weight: 125 lbs, BMI: 20.2, Temperature: 98.6 F, Pulse: 71 bpm, Respiratory Rate: 16 breaths/min, Blood Pressure: 142/65 mmHg. General Notes: Wound exam; left anterior lower leg. Not as healthy as surface today. Using a #5 curette I removed eschar from the wound circumference as well as necrotic debris from the wound surface. This had a gritty sound with debridement suggestive of a biofilm which will require debridement. There is no evidence of surrounding infection Integumentary (Hair, Skin) Wound #1 status is Open. Original cause of wound was Trauma. The wound is located on the Left,Anterior Lower Leg. The wound measures 1.8cm length x 1.8cm width x 0.1cm depth; 2.545cm^2 area and 0.254cm^3 volume. There is Fat Layer (Subcutaneous Tissue) Exposed exposed. There is no tunneling or undermining noted. There is a medium amount of serous drainage noted. The wound margin is flat and intact. There is small (1-33%) pink granulation within the wound bed. There is a Baar, Zen H. ([id number]) large (67-100%) amount of necrotic tissue within the wound bed including Adherent Slough. Assessment Active Problems ICD-10 Non-pressure chronic ulcer of left calf limited to breakdown of skin Chronic venous hypertension (idiopathic) without complications of left lower extremity Type 2 diabetes mellitus with other skin ulcer Procedures Wound #1 Pre-procedure diagnosis of Wound #1 is a Skin Tear located on the Left,Anterior Lower Leg .Severity of Tissue Pre Debridement is: Fat layer exposed. There was a Excisional Skin/Subcutaneous Tissue Debridement with a total area of 3.24 sq cm performed by Ricard Dillon, MD. With the following instrument(s): Curette to remove Viable and Non-Viable tissue/material. Material removed includes Eschar and Subcutaneous Tissue and. No specimens were taken. A time out was conducted at 09:00, prior to the start of the  procedure. There was no bleeding. The procedure was tolerated well with a pain level of 0 throughout and a pain level of 0 following the procedure. Post Debridement Measurements: 1.8cm length x 1.8cm width x 0.1cm depth; 0.254cm^3 volume. Character of Wound/Ulcer Post Debridement is improved. Severity of Tissue Post Debridement is: Fat layer exposed. Post procedure Diagnosis Wound #1: Same as Pre-Procedure Plan Wound Cleansing: Wound #1 Left,Anterior Lower Leg: Clean wound with Normal Saline. Anesthetic (add to Medication List): Wound #1 Left,Anterior Lower Leg: Topical Lidocaine 4% cream applied to wound bed  prior to debridement (In Clinic Only). Primary Wound Dressing: Wound #1 Left,Anterior Lower Leg: Hydrafera Blue Ready Transfer Secondary Dressing: ABD and Kerlix/Conform - stretch net to secure Dressing Change Frequency: Wound #1 Left,Anterior Lower Leg: Change dressing every other day. Follow-up Appointments: Wound #1 Left,Anterior Lower Leg: Return Appointment in 1 week. Edema Control: Elevate legs to the level of the heart and pump ankles as often as possible [name], Gina H. ([id number]) 1. I have changed the dressing to Hydrofera Blue 2. ABD under Curlex and Coban. The patient is changing this Electronic Signature(s) Signed: 11/21/2018 4:56:01 PM By: Linton Ham MD Entered By: Linton Ham on 11/21/2018 09:10:47 [name] ([id number]) -------------------------------------------------------------------------------- [place] Details Patient Name: [name]. Date of Service: 11/21/2018 Medical Record Number: [id number] Patient Account Number: [id number] Date of Birth/Sex: 1932/05/18 (85 y.o. 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

## 2018-11-28 ENCOUNTER — Other Ambulatory Visit: Payer: Self-pay

## 2018-11-28 ENCOUNTER — Encounter: Payer: Medicare Other | Attending: Internal Medicine | Admitting: Internal Medicine

## 2018-11-28 DIAGNOSIS — L97221 Non-pressure chronic ulcer of left calf limited to breakdown of skin: Secondary | ICD-10-CM | POA: Diagnosis not present

## 2018-11-28 DIAGNOSIS — E1151 Type 2 diabetes mellitus with diabetic peripheral angiopathy without gangrene: Secondary | ICD-10-CM | POA: Insufficient documentation

## 2018-11-28 DIAGNOSIS — E11622 Type 2 diabetes mellitus with other skin ulcer: Secondary | ICD-10-CM | POA: Insufficient documentation

## 2018-11-30 NOTE — Progress Notes (Signed)
[name], [name] ([id number]) Visit Report for 11/28/2018 Arrival Information Details Patient Name: [name], [name]. Date of Service: 11/28/2018 9:15 AM Medical Record Number: [id number] Patient Account Number: [id number] Date of Birth/Sex: Nov 29, 1932 (85 y.o. F) Treating RN: [name] Primary Care [name]: [name] Other Clinician: Referring [name]: [name] Treating [name]/Extender: [name] in Treatment: 3 Visit Information History Since Last Visit Added or deleted any medications: No Patient Arrived: Ambulatory Any new allergies or adverse reactions: No Arrival Time: 09:16 Had a fall or experienced change in No Accompanied By: husband activities of daily living that may affect Transfer Assistance: None risk of falls: Patient Identification Verified: Yes Signs or symptoms of abuse/neglect since last visito No Secondary Verification Process Completed: Yes Hospitalized since last visit: No Patient Has Alerts: Yes Implantable device outside of the clinic excluding No Patient Alerts: DMII cellular tissue based products placed in the center since last visit: Has Dressing in Place as Prescribed: Yes Pain Present Now: No Electronic Signature(s) Signed: 11/28/2018 4:15:15 PM By: [name] Entered By: [name] on 11/28/2018 09:17:12 [name], [name] ([id number]) -------------------------------------------------------------------------------- Clinic Level of Care Assessment Details Patient Name: [name]. Date of Service: 11/28/2018 9:15 AM Medical Record Number: [id number] Patient Account Number: [id number] Date of Birth/Sex: February 13, 1933 (85 y.o. F) Treating RN: [name] Primary Care [name]: [name] Other Clinician: Referring [name]: [name] Treating [name]/Extender: [name] in Treatment: 3 Clinic Level of Care Assessment Items TOOL 4 Quantity Score []  - Use when only an EandM is performed on FOLLOW-UP visit  0 ASSESSMENTS - Nursing Assessment / Reassessment X - Reassessment of Co-morbidities (includes updates in patient status) 1 10 X- 1 5 Reassessment of Adherence to Treatment Plan ASSESSMENTS - Wound and Skin Assessment / Reassessment X - Simple Wound Assessment / Reassessment - one wound 1 5 []  - 0 Complex Wound Assessment / Reassessment - multiple wounds []  - 0 Dermatologic / Skin Assessment (not related to wound area) ASSESSMENTS - Focused Assessment []  - Circumferential Edema Measurements - multi extremities 0 []  - 0 Nutritional Assessment / Counseling / Intervention []  - 0 Lower Extremity Assessment (monofilament, tuning fork, pulses) []  - 0 Peripheral Arterial Disease Assessment (using hand held doppler) ASSESSMENTS - Ostomy and/or Continence Assessment and Care []  - Incontinence Assessment and Management 0 []  - 0 Ostomy Care Assessment and Management (repouching, etc.) PROCESS - Coordination of Care X - Simple Patient / Family Education for ongoing care 1 15 []  - 0 Complex (extensive) Patient / Family Education for ongoing care []  - 0 Staff obtains Programmer, systems, Records, Test Results / Process Orders []  - 0 Staff telephones HHA, Nursing Homes / Clarify orders / etc []  - 0 Routine Transfer to another Facility (non-emergent condition) []  - 0 Routine Hospital Admission (non-emergent condition) []  - 0 New Admissions / Biomedical engineer / Ordering NPWT, Apligraf, etc. []  - 0 Emergency Hospital Admission (emergent condition) X- 1 10 Simple Discharge Coordination [name], [name] []  - 0 Complex (extensive) Discharge Coordination PROCESS - Special Needs []  - Pediatric / Minor Patient Management 0 []  - 0 Isolation Patient Management []  - 0 Hearing / Language / Visual special needs []  - 0 Assessment of Community assistance (transportation, D/C planning, etc.) []  - 0 Additional assistance / Altered mentation []  - 0 Support Surface(s) Assessment (bed,  cushion, seat, etc.) INTERVENTIONS - Wound Cleansing / Measurement X - Simple Wound Cleansing - one wound 1 5 []  - 0 Complex Wound Cleansing -  multiple wounds X- 1 5 Wound Imaging (photographs - any number of wounds) []  - 0 Wound Tracing (instead of photographs) X- 1 5 Simple Wound Measurement - one wound []  - 0 Complex Wound Measurement - multiple wounds INTERVENTIONS - Wound Dressings []  - Small Wound Dressing one or multiple wounds 0 X- 1 15 Medium Wound Dressing one or multiple wounds []  - 0 Large Wound Dressing one or multiple wounds []  - 0 Application of Medications - topical []  - 0 Application of Medications - injection INTERVENTIONS - Miscellaneous []  - External ear exam 0 []  - 0 Specimen Collection (cultures, biopsies, blood, body fluids, etc.) []  - 0 Specimen(s) / Culture(s) sent or taken to Conrad for analysis []  - 0 Patient Transfer (multiple staff / Civil Service fast streamer / Similar devices) []  - 0 Simple Staple / Suture removal (25 or less) []  - 0 Complex Staple / Suture removal (26 or more) []  - 0 Hypo / Hyperglycemic Management (close monitor of Blood Glucose) []  - 0 Ankle / Brachial Index (ABI) - do not check if billed separately X- 1 5 Vital Signs Conrad, Gina H. ([id number]) Has the patient been seen at the hospital within the last three years: Yes Total Score: 80 Level Of Care: New/Established - Level 3 Electronic Signature(s) Signed: 11/30/2018 10:04:45 AM By: [name], BSN, RN, CWS, Kim RN, BSN Entered By: [name], BSN, RN, CWS, [name] on 11/28/2018 09:33:44 [name] ([id number]) -------------------------------------------------------------------------------- Encounter Discharge Information Details Patient Name: [name]. Date of Service: 11/28/2018 9:15 AM Medical Record Number: [id number] Patient Account Number: [id number] Date of Birth/Sex: [birth year] (85 y.o. F) Treating RN: [name] Primary Care Hakop Humbarger: [name] Other Clinician: Referring  Mareesa Gathright: [name] Treating Roselynn Whitacre/Extender: [name] in Treatment: 3 Encounter Discharge Information Items Discharge Condition: Stable Ambulatory Status: Ambulatory Discharge Destination: Home Transportation: Private Auto Accompanied By: husband Schedule Follow-up Appointment: Yes Clinical Summary of Care: Electronic Signature(s) Signed: 11/30/2018 10:04:45 AM By: [name], BSN, RN, CWS, Kim RN, BSN Entered By: [name], BSN, RN, CWS, [name] on 11/28/2018 09:34:48 [name] ([id number]) -------------------------------------------------------------------------------- Lower Extremity Assessment Details Patient Name: [name], Gina H. Date of Service: 11/28/2018 9:15 AM Medical Record Number: [id number] Patient Account Number: [id number] Date of Birth/Sex: 06/06/1932 (85 y.o. F) Treating RN: [name] Primary Care Shannan Garfinkel: [name] Other Clinician: Referring Karaline Buresh: [name] Treating Reneka Nebergall/Extender: Ricard Dillon Weeks in Treatment: 3 Edema Assessment Assessed: [Left: No] [Right: No] Edema: [Left: N] [Right: o] Vascular Assessment Pulses: Dorsalis Pedis Palpable: [Left:Yes] Electronic Signature(s) Signed: 11/28/2018 4:15:15 PM By: [name] Entered By: [name] on 11/28/2018 09:23:05 [name], [name] ([id number]) -------------------------------------------------------------------------------- Multi Wound Chart Details Patient Name: Gina Haring H. Date of Service: 11/28/2018 9:15 AM Medical Record Number: [id number] Patient Account Number: [id number] Date of Birth/Sex: [birth year] (85 y.o. F) Treating RN: [name] Primary Care Bryony Kaman: [name] Other Clinician: Referring Zinedine Ellner: [name] Treating Jermichael Belmares/Extender: [name] in Treatment: 3 Vital Signs Height(in): 66 Pulse(bpm): 25 Weight(lbs): 125 Blood Pressure(mmHg): 148/62 Body Mass Index(BMI): 20 Temperature(F): 98.7 Respiratory  Rate 16 (breaths/min): Photos: [N/A:N/A] Wound Location: Left Lower Leg - Anterior N/A N/A Wounding Event: Trauma N/A N/A Primary Etiology: Skin Tear N/A N/A Secondary Etiology: Diabetic Wound/Ulcer of the N/A N/A Lower Extremity Comorbid History: Type II Diabetes N/A N/A Date Acquired: 10/22/2018 N/A N/A Weeks of Treatment: 3 N/A N/A Wound Status: Open N/A N/A Measurements L x W x D 1.1x1x0.1 N/A N/A (cm) Area (cm) : 0.864 N/A N/A Volume (cm) :  0.086 N/A N/A % Reduction in Area: 69.40% N/A N/A % Reduction in Volume: 69.60% N/A N/A Classification: Full Thickness Without N/A N/A Exposed Support Structures Exudate Amount: Medium N/A N/A Exudate Type: Serous N/A N/A Exudate Color: amber N/A N/A Wound Margin: Flat and Intact N/A N/A Granulation Amount: Large (67-100%) N/A N/A Granulation Quality: Pink N/A N/A Necrotic Amount: Small (1-33%) N/A N/A Exposed Structures: Fat Layer (Subcutaneous N/A N/A Tissue) Exposed: Yes Epithelialization: None N/A N/A [name], Gina H. ([id number]) Treatment Notes Wound #1 (Left, Anterior Lower Leg) Notes contact layer, hydrofera blue, abd, conform secured with netting Electronic Signature(s) Signed: 11/28/2018 5:28:46 PM By: Linton Ham MD Entered By: Linton Ham on 11/28/2018 09:42:22 [name] ([id number]) -------------------------------------------------------------------------------- Multi-Disciplinary Care Plan Details Patient Name: [name]. Date of Service: 11/28/2018 9:15 AM Medical Record Number: [id number] Patient Account Number: [id number] Date of Birth/Sex: [birth year] (85 y.o. F) Treating RN: [name] Primary Care Kristi Hyer: [name] Other Clinician: Referring Alfonza Toft: [name] Treating Jerrilyn Messinger/Extender: [name] in Treatment: 3 Active Inactive Necrotic Tissue Nursing Diagnoses: Impaired tissue integrity related to necrotic/devitalized tissue Goals: Necrotic/devitalized tissue will  be minimized in the wound bed Date Initiated: 11/07/2018 Target Resolution Date: 11/15/2018 Goal Status: Active Interventions: Assess patient pain level pre-, during and post procedure and prior to discharge Treatment Activities: Excisional debridement : 11/07/2018 Notes: Orientation to the Wound Care Program Nursing Diagnoses: Knowledge deficit related to the wound healing center program Goals: Patient/caregiver will verbalize understanding of the [place] Date Initiated: 11/07/2018 Target Resolution Date: 11/07/2018 Goal Status: Active Interventions: Provide education on orientation to the wound center Notes: Wound/Skin Impairment Nursing Diagnoses: Impaired tissue integrity Goals: Patient/caregiver will verbalize understanding of skin care regimen Date Initiated: 11/07/2018 Target Resolution Date: 11/07/2018 Goal Status: Active [name], [name] ([id number]) Interventions: Assess ulceration(s) every visit Treatment Activities: Referred to DME Breeona Waid for dressing supplies : 11/07/2018 Skin care regimen initiated : 11/07/2018 Topical wound management initiated : 11/07/2018 Notes: Electronic Signature(s) Signed: 11/30/2018 10:04:45 AM By: [name], BSN, RN, CWS, Kim RN, BSN Entered By: [name], BSN, RN, CWS, [name] on 11/28/2018 09:30:42 [name] ([id number]) -------------------------------------------------------------------------------- Pain Assessment Details Patient Name: [name]. Date of Service: 11/28/2018 9:15 AM Medical Record Number: [id number] Patient Account Number: [id number] Date of Birth/Sex: [birth year] (85 y.o. F) Treating RN: [name] Primary Care Keayra Graham: [name] Other Clinician: Referring Gil Ingwersen: [name] Treating Damion Kant/Extender: Ricard Dillon Weeks in Treatment: 3 Active Problems Location of Pain Severity and Description of Pain Patient Has Paino No Site Locations Pain Management and Medication Current Pain  Management: Electronic Signature(s) Signed: 11/28/2018 4:15:15 PM By: [name] Entered By: [name] on 11/28/2018 09:17:36 [name] ([id number]) -------------------------------------------------------------------------------- Patient/Caregiver Education Details Patient Name: [name]. Date of Service: 11/28/2018 9:15 AM Medical Record Number: [id number] Patient Account Number: [id number] Date of Birth/Gender: 10-03-1932 (85 y.o. F) Treating RN: [name] Primary Care Physician: [name] Other Clinician: Referring Physician: Ramonita Conrad Treating Physician/Extender: [name] in Treatment: 3 Education Assessment Education Provided To: Patient Education Topics Provided Wound/Skin Impairment: Handouts: Caring for Your Ulcer Methods: Demonstration, Explain/Verbal Responses: State content correctly Electronic Signature(s) Signed: 11/30/2018 10:04:45 AM By: [name], BSN, RN, CWS, Kim RN, BSN Entered By: [name], BSN, RN, CWS, [name] on 11/28/2018 09:33:58 [name] ([id number]) -------------------------------------------------------------------------------- Wound Assessment Details Patient Name: [name]. Date of Service: 11/28/2018 9:15 AM Medical Record Number: [id number] Patient Account Number: [id number] Date of Birth/Sex: 22-Feb-1933 (85 y.o. 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 Event: Trauma Wound Status: Open Date Acquired: 10/22/2018 Comorbid History: Type II Diabetes Weeks Of Treatment: 3 Clustered Wound: No Photos Wound Measurements Length: (cm) 1.1 Width: (cm) 1 Depth: (cm) 0.1 Area: (cm) 0.864 Volume: (cm)  0.086 % Reduction in Area: 69.4% % Reduction in Volume: 69.6% Epithelialization: None Tunneling: No Undermining: No Wound Description Full Thickness Without Exposed Support Classification: Structures Wound Margin: Flat and Intact Exudate Medium Amount: Exudate Type: Serous Exudate Color: amber Foul Odor After Cleansing: No Slough/Fibrino Yes Wound Bed Granulation Amount: Large (67-100%) Exposed Structure Granulation Quality: Pink Fat Layer (Subcutaneous Tissue) Exposed: Yes Necrotic Amount: Small (1-33%) Necrotic Quality: Adherent Slough Treatment Notes Wound #1 (Left, Anterior Lower Leg) [name], Gina H. ([id number]) Notes contact layer, hydrofera blue, abd, conform secured with netting Electronic Signature(s) Signed: 11/28/2018 4:15:15 PM By: [name] Entered By: [name] on 11/28/2018 09:22:49 [name], [name] ([id number]) -------------------------------------------------------------------------------- Vitals Details Patient Name: [name]. Date of Service: 11/28/2018 9:15 AM Medical Record Number: [id number] Patient Account Number: [id number] Date of Birth/Sex: 06/19/1932 (85 y.o. F) Treating RN: [name] Primary Care Montario Zilka: [name] Other Clinician: Referring Aishah Teffeteller: [name] Treating Marishka Rentfrow/Extender: [name] in Treatment: 3 Vital Signs Time Taken: 09:17 Temperature (F): 98.7 Height (in): 66 Pulse (bpm): 69 Weight (lbs): 125 Respiratory Rate (breaths/min): 16 Body Mass Index (BMI): 20.2 Blood Pressure (mmHg): 148/62 Reference Range: 80 - 120 mg / dl Electronic Signature(s) Signed: 11/28/2018 4:15:15 PM By: [name] Entered By: [name] on 11/28/2018 09:18:07

## 2018-11-30 NOTE — Progress Notes (Signed)
TRINA, DEATON ([id number]) Visit Report for 11/28/2018 HPI Details Patient Name: [name], [name]. Date of Service: 11/28/2018 9:15 AM Medical Record Number: [id number] Patient Account Number: [id number] Date of Birth/Sex: [birth year] (85 y.o. F) Treating RN: Cornell Barman Primary Care Provider: Ramonita Lab Other Clinician: Referring Provider: Ramonita Lab Treating Provider/Extender: Tito Dine in Treatment: 3 History of Present Illness HPI Description: Pleasant 85 year old female who sustained dog scratch injury to the left anterior leg after her son's golden retriever greeted her and accidentally scratched her leg. This happened about 2 weeks ago and since then she has had a flap of skin covering the area she has not been using any particular dressing or ointment to this wound which has a partial layer of skin covering the lower end of it and she decided to come to the wound clinic to get some attention to it. She denies any significant drainage or discomfort or swelling Patient's history is unremarkable for any significant medical issues she is on aspirin and simvastatin prescribed by her son but she essentially has hypertension as her only medical problem. She does take a baby aspirin But is on no other blood thinners ABIs in the clinic today on the left 1.05 8/19; this is a patient with chronic venous insufficiency very fragile skin on her bilateral lower legs. She suffered a dog scratch and was admitted to the clinic last week with a skin tear. Apparently the covering flap was nonviable and was removed. She has a reasonably small clean wound remaining. 8/26; patient wound is not as viable in terms of surface we have been using silver alginate and the patient reports this got stuck to the underlying tissue when she change the dressing. Past medical history updated to note that she is a type II diabetic on oral agents 9/2; wound is smaller healthy surface. They report the Hydrofera  Blue sticking to the wound will put a contact layer underneath the Hydrofera Blue. Electronic Signature(s) Signed: 11/28/2018 5:28:46 PM By: Linton Ham MD Entered By: Linton Ham on 11/28/2018 09:43:55 [name] ([id number]) -------------------------------------------------------------------------------- Physical Exam Details Patient Name: [name]. Date of Service: 11/28/2018 9:15 AM Medical Record Number: [id number] Patient Account Number: [id number] Date of Birth/Sex: [birth year] (85 y.o. F) Treating RN: Cornell Barman Primary Care Provider: Ramonita Lab Other Clinician: Referring Provider: Ramonita Lab Treating Provider/Extender: Tito Dine in Treatment: 3 Constitutional Patient is hypertensive.. Pulse regular and within target range for patient.Marland Kitchen Respirations regular, non-labored and within target range.. Temperature is normal and within the target range for the patient.Marland Kitchen appears in no distress. Respiratory Respiratory effort is easy and symmetric bilaterally. Rate is normal at rest and on room air.. Cardiovascular Pedal pulses are palpable. The patient has evidence of chronic venous hypertension but not much edema. Integumentary (Hair, Skin) Dilated veins in her lower leg hemosiderin deposition over the anterior tibia area.Marland Kitchen Psychiatric No evidence of depression, anxiety, or agitation. Calm, cooperative, and communicative. Appropriate interactions and affect.. Notes Wound exam; left anterior lower leg. Nice healthy looking surface. No debridement is necessary wound measured smaller. As long as this continues to get smaller no further debridement may be necessary. There is no evidence of surrounding Electronic Signature(s) Signed: 11/28/2018 5:28:46 PM By: Linton Ham MD Entered By: Linton Ham on 11/28/2018 09:45:56 [name] ([id number]) -------------------------------------------------------------------------------- Physician Orders  Details Patient Name: [name]. Date of Service: 11/28/2018 9:15 AM Medical Record Number: [id number] Patient Account Number: [id number] Date of Birth/Sex: [birth year] (85 y.o.  F) Treating RN: Cornell Barman Primary Care Provider: Ramonita Lab Other Clinician: Referring Provider: Ramonita Lab Treating Provider/Extender: Tito Dine in Treatment: 3 Verbal / Phone Orders: No Diagnosis Coding Wound Cleansing Wound #1 Left,Anterior Lower Leg o Clean wound with Normal Saline. Anesthetic (add to Medication List) Wound #1 Left,Anterior Lower Leg o Topical Lidocaine 4% cream applied to wound bed prior to debridement (In Clinic Only). Primary Wound Dressing Wound #1 Left,Anterior Lower Leg o Hydrafera Blue Ready Transfer - contact layer for sticking Secondary Dressing o ABD and Kerlix/Conform - stretch net to secure Dressing Change Frequency Wound #1 Left,Anterior Lower Leg o Change dressing every other day. Follow-up Appointments Wound #1 Left,Anterior Lower Leg o Return Appointment in 1 week. Edema Control Wound #1 Left,Anterior Lower Leg o Elevate legs to the level of the heart and pump ankles as often as possible Notes Patient to pick up compression stocking for protection. Electronic Signature(s) Signed: 11/28/2018 5:28:46 PM By: Linton Ham MD Signed: 11/30/2018 10:04:45 AM By: Gretta Cool, BSN, RN, CWS, Kim RN, BSN Entered By: Gretta Cool, BSN, RN, CWS, Kim on 11/28/2018 09:33:01 [name], [name] ([id number]) -------------------------------------------------------------------------------- Problem List Details Patient Name: [name], [name]. Date of Service: 11/28/2018 9:15 AM Medical Record Number: [id number] Patient Account Number: [id number] Date of Birth/Sex: 01/10/1933 (85 y.o. 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 Date of Service: 11/28/2018 9:15 AM Medical Record Number: [id number] Patient Account Number: [id number] Date of Birth/Sex: [birth year] (85 y.o. F) Treating RN: Cornell Barman Primary Care Provider: Ramonita Lab Other Clinician: Referring Provider: Ramonita Lab Treating Provider/Extender: Tito Dine in Treatment: 3 Subjective History of Present Illness (HPI) Pleasant 85 year old female who sustained dog scratch injury to the left anterior leg after her son's golden retriever greeted her and accidentally scratched her leg. This happened about 2 weeks ago and since then she has had a flap of skin covering the area she has not been using any particular dressing or ointment to this wound which has a partial layer of skin covering the lower end of it and she decided to come to the wound clinic to get some attention to it. She denies any significant drainage or discomfort or swelling Patient's history is unremarkable for any significant medical issues she is on aspirin and simvastatin prescribed by her son but she essentially has hypertension as her only medical problem. She does take a baby aspirin But is on no other blood thinners ABIs in the clinic today on the left 1.05 8/19; this is a patient with chronic  venous insufficiency very fragile skin on her bilateral lower legs. She suffered a dog scratch and was admitted to the clinic last week with a skin tear. Apparently the covering flap was nonviable and was removed. She has a reasonably small clean wound remaining. 8/26; patient wound is not as viable in terms of surface we have been using silver alginate  and the patient reports this got stuck to the underlying tissue when she change the dressing. Past medical history updated to note that she is a type II diabetic on oral agents 9/2; wound is smaller healthy surface. They report the Hydrofera Blue sticking to the wound will put a contact layer underneath the Hydrofera Blue. Objective Constitutional Patient is hypertensive.. Pulse regular and within target range for patient.Marland Kitchen Respirations regular, non-labored and within target range.. Temperature is normal and within the target range for the patient.Marland Kitchen appears in no distress. Vitals Time Taken: 9:17 AM, Height: 66 in, Weight: 125 lbs, BMI: 20.2, Temperature: 98.7 F, Pulse: 69 bpm, Respiratory Rate: 16 breaths/min, Blood Pressure: 148/62 mmHg. Respiratory Respiratory effort is easy and symmetric bilaterally. Rate is normal at rest and on room air.. Cardiovascular Pedal pulses are palpable. The patient has evidence of chronic venous hypertension but not much edema. Psychiatric No evidence of depression, anxiety, or agitation. Calm, cooperative, and communicative. Appropriate interactions and affect.Marland Kitchen [name], [name] ([id number]) General Notes: Wound exam; left anterior lower leg. Nice healthy looking surface. No debridement is necessary wound measured smaller. As long as this continues to get smaller no further debridement may be necessary. There is no evidence of surrounding Integumentary (Hair, Skin) Dilated veins in her lower leg hemosiderin deposition over the anterior tibia area.. Wound #1 status is Open. Original cause of wound was  Trauma. The wound is located on the Left,Anterior Lower Leg. The wound measures 1.1cm length x 1cm width x 0.1cm depth; 0.864cm^2 area and 0.086cm^3 volume. There is Fat Layer (Subcutaneous Tissue) Exposed exposed. There is no tunneling or undermining noted. There is a medium amount of serous drainage noted. The wound margin is flat and intact. There is large (67-100%) pink granulation within the wound bed. There is a small (1-33%) amount of necrotic tissue within the wound bed including Adherent Slough. Assessment Active Problems ICD-10 Non-pressure chronic ulcer of left calf limited to breakdown of skin Chronic venous hypertension (idiopathic) without complications of left lower extremity Type 2 diabetes mellitus with other skin ulcer Plan Wound Cleansing: Wound #1 Left,Anterior Lower Leg: Clean wound with Normal Saline. Anesthetic (add to Medication List): Wound #1 Left,Anterior Lower Leg: Topical Lidocaine 4% cream applied to wound bed prior to debridement (In Clinic Only). Primary Wound Dressing: Wound #1 Left,Anterior Lower Leg: Hydrafera Blue Ready Transfer - contact layer for sticking Secondary Dressing: ABD and Kerlix/Conform - stretch net to secure Dressing Change Frequency: Wound #1 Left,Anterior Lower Leg: Change dressing every other day. Follow-up Appointments: Wound #1 Left,Anterior Lower Leg: Return Appointment in 1 week. Edema Control: Wound #1 Left,Anterior Lower Leg: Elevate legs to the level of the heart and pump ankles as often as possible General Notes: Patient to pick up compression stocking for protection. [name], Gina H. ([id number]) 1. Left anterior lower leg. We will put a contact layer underneath the Hydrofera Blue ready 2. Nice improvement from last week and no mechanical debridement is necessary 3. The patient has chronic venous hypertension. This wound was an injury [from her dog] however she might benefit from support stockings going  forward Electronic Signature(s) Signed: 11/28/2018 5:28:46 PM By: Linton Ham MD Entered By: Linton Ham on 11/28/2018 09:47:02 [name], [name] ([id number]) -------------------------------------------------------------------------------- [place] Details Patient Name: [name]. Date of Service: 11/28/2018 Medical Record Number: [id number] Patient Account Number: [id number] Date of Birth/Sex: 1932/08/29 (85 y.o. F) Treating RN: Cornell Barman Primary Care Provider: Ramonita Lab Other Clinician: Referring Provider: Ramonita Lab Treating Provider/Extender: Ricard Dillon  Weeks in Treatment: 3 Diagnosis Coding ICD-10 Codes Code Description [phone] Non-pressure chronic ulcer of left calf limited to breakdown of skin I87.302 Chronic venous hypertension (idiopathic) without complications of left lower extremity E11.622 Type 2 diabetes mellitus with other skin ulcer Facility Procedures CPT4 Code: [id number] Description: 99213 - WOUND CARE VISIT-LEV 3 EST PT Modifier: Quantity: 1 Physician Procedures CPT4 Code Description: [id number] - WC PHYS LEVEL 3 - EST PT ICD-10 Diagnosis Description L97.221 Non-pressure chronic ulcer of left calf limited to breakdown o I87.302 Chronic venous hypertension (idiopathic) without complications 0000000 Type 2  diabetes mellitus with other skin ulcer Modifier: f skin of left lower e Quantity: 1 xtremity Electronic Signature(s) Signed: 11/28/2018 5:28:46 PM By: Linton Ham MD Entered By: Linton Ham on 11/28/2018 09:47:19

## 2018-12-04 ENCOUNTER — Encounter: Payer: Medicare Other | Admitting: Physician Assistant

## 2018-12-04 ENCOUNTER — Other Ambulatory Visit: Payer: Self-pay

## 2018-12-04 DIAGNOSIS — E11622 Type 2 diabetes mellitus with other skin ulcer: Secondary | ICD-10-CM | POA: Diagnosis not present

## 2018-12-04 NOTE — Progress Notes (Addendum)
[name], [name] ([id number]) Visit Report for 12/04/2018 Chief Complaint Document Details Patient Name: [name], [name]. Date of Service: 12/04/2018 2:45 PM Medical Record Number: [id number] Patient Account Number: [id number] Date of Birth/Sex: [birth year] (85 y.o. F) Treating RN: Montey Hora Primary Care Provider: Ramonita Lab Other Clinician: Referring Provider: Ramonita Lab Treating Provider/Extender: Melburn Hake, HOYT Weeks in Treatment: 3 Information Obtained from: Patient Chief Complaint Left leg skin tear from dog scratch Electronic Signature(s) Signed: 12/04/2018 2:54:40 PM By: Worthy Keeler PA-C Entered By: Worthy Keeler on 12/04/2018 14:54:40 [name], [name] ([id number]) -------------------------------------------------------------------------------- HPI Details Patient Name: [name]. Date of Service: 12/04/2018 2:45 PM Medical Record Number: [id number] Patient Account Number: [id number] Date of Birth/Sex: May 20, 1932 (85 y.o. F) Treating RN: Montey Hora Primary Care Provider: Ramonita Lab Other Clinician: Referring Provider: Ramonita Lab Treating Provider/Extender: Melburn Hake, HOYT Weeks in Treatment: 3 History of Present Illness HPI Description: Pleasant 85 year old female who sustained dog scratch injury to the left anterior leg after her son's golden retriever greeted her and accidentally scratched her leg. This happened about 2 weeks ago and since then she has had a flap of skin covering the area she has not been using any particular dressing or ointment to this wound which has a partial layer of skin covering the lower end of it and she decided to come to the wound clinic to get some attention to it. She denies any significant drainage or discomfort or swelling Patient's history is unremarkable for any significant medical issues she is on aspirin and simvastatin prescribed by her son but she essentially has hypertension as her only medical problem. She does take a  baby aspirin But is on no other blood thinners ABIs in the clinic today on the left 1.05 8/19; this is a patient with chronic venous insufficiency very fragile skin on her bilateral lower legs. She suffered a dog scratch and was admitted to the clinic last week with a skin tear. Apparently the covering flap was nonviable and was removed. She has a reasonably small clean wound remaining. 8/26; patient wound is not as viable in terms of surface we have been using silver alginate and the patient reports this got stuck to the underlying tissue when she change the dressing. Past medical history updated to note that she is a type II diabetic on oral agents 9/2; wound is smaller healthy surface. They report the Hydrofera Blue sticking to the wound will put a contact layer underneath the Hydrofera Blue. 12/04/2018 this is a patient whom I am seeing for the first time today. Unfortunately she has a skin tear to her lower extremity although this is showing signs of good improvement which is excellent news. There is no signs of active infection at this time and overall the wound is measuring smaller today. She feels like the contact layer has done very well to keep this from sticking and that has been of benefit to her. Electronic Signature(s) Signed: 12/04/2018 3:00:34 PM By: Worthy Keeler PA-C Entered By: Worthy Keeler on 12/04/2018 15:00:33 [name] ([id number]) -------------------------------------------------------------------------------- Physical Exam Details Patient Name: [name]. Date of Service: 12/04/2018 2:45 PM Medical Record Number: [id number] Patient Account Number: [id number] Date of Birth/Sex: 1932/05/22 (85 y.o. F) Treating RN: Montey Hora Primary Care Provider: Ramonita Lab Other Clinician: Referring Provider: Ramonita Lab Treating Provider/Extender: Melburn Hake, HOYT Weeks in Treatment: 3 Constitutional Well-nourished and well-hydrated in no acute  distress. Respiratory normal breathing without difficulty. clear to auscultation bilaterally. Cardiovascular  regular rate and rhythm with normal S1, S2. Psychiatric this patient is able to make decisions and demonstrates good insight into disease process. Alert and Oriented x 3. pleasant and cooperative. Notes Patient's wound VAC currently showed signs of good granulation at this time there does not appear to be any evidence of active infection which is also good news. No sharp debridement was necessary nor performed today. Electronic Signature(s) Signed: 12/04/2018 3:01:08 PM By: Worthy Keeler PA-C Entered By: Worthy Keeler on 12/04/2018 15:01:07 [name] ([id number]) -------------------------------------------------------------------------------- Physician Orders Details Patient Name: [name]. Date of Service: 12/04/2018 2:45 PM Medical Record Number: [id number] Patient Account Number: [id number] Date of Birth/Sex: 1933-03-07 (85 y.o. F) Treating RN: Montey Hora Primary Care Provider: Ramonita Lab Other Clinician: Referring Provider: Ramonita Lab Treating Provider/Extender: Melburn Hake, HOYT Weeks in Treatment: 3 Verbal / Phone Orders: No Diagnosis Coding ICD-10 Coding Code Description L97.221 Non-pressure chronic ulcer of left calf limited to breakdown of skin I87.302 Chronic venous hypertension (idiopathic) without complications of left lower extremity E11.622 Type 2 diabetes mellitus with other skin ulcer Wound Cleansing Wound #1 Left,Anterior Lower Leg o Clean wound with Normal Saline. Anesthetic (add to Medication List) Wound #1 Left,Anterior Lower Leg o Topical Lidocaine 4% cream applied to wound bed prior to debridement (In Clinic Only). Primary Wound Dressing Wound #1 Left,Anterior Lower Leg o Hydrafera Blue Ready Transfer - contact layer for sticking Secondary Dressing o ABD and Kerlix/Conform - stretch net to secure Dressing Change  Frequency Wound #1 Left,Anterior Lower Leg o Change dressing every other day. Follow-up Appointments Wound #1 Left,Anterior Lower Leg o Return Appointment in 1 week. Edema Control Wound #1 Left,Anterior Lower Leg o Elevate legs to the level of the heart and pump ankles as often as possible Electronic Signature(s) Signed: 12/04/2018 4:33:06 PM By: Montey Hora Signed: 12/04/2018 5:39:49 PM By: Worthy Keeler PA-C Entered By: Montey Hora on 12/04/2018 14:58:20 [name], [name] ([id number]) -------------------------------------------------------------------------------- Problem List Details Patient Name: [name]. Date of Service: 12/04/2018 2:45 PM Medical Record Number: [id number] Patient Account Number: [id number] Date of Birth/Sex: [birth year] (85 y.o. F) Treating RN: Montey Hora Primary Care Provider: Ramonita Lab Other Clinician: Referring Provider: Ramonita Lab Treating Provider/Extender: Melburn Hake, HOYT Weeks in Treatment: 3 Active Problems ICD-10 Evaluated Encounter Code Description Active Date Today Diagnosis L97.221 Non-pressure chronic ulcer of left calf limited to breakdown of 11/07/2018 No Yes skin I87.302 Chronic venous hypertension (idiopathic) without 123456 No Yes complications of left lower extremity E11.622 Type 2 diabetes mellitus with other skin ulcer 11/21/2018 No Yes Inactive Problems Resolved Problems Electronic Signature(s) Signed: 12/04/2018 2:54:25 PM By: Worthy Keeler PA-C Entered By: Worthy Keeler on 12/04/2018 14:54:24 [name], [name] ([id number]) -------------------------------------------------------------------------------- Progress Note Details Patient Name: [name]. Date of Service: 12/04/2018 2:45 PM Medical Record Number: [id number] Patient Account Number: [id number] Date of Birth/Sex: Sep 05, 1932 (85 y.o. F) Treating RN: Montey Hora Primary Care Provider: Ramonita Lab Other Clinician: Referring Provider: Ramonita Lab Treating Provider/Extender: Melburn Hake, HOYT Weeks in Treatment: 3 Subjective Chief Complaint Information obtained from Patient Left leg skin tear from dog scratch History of Present Illness (HPI) Pleasant 85 year old female who sustained dog scratch injury to the left anterior leg after her son's golden retriever greeted her and accidentally scratched her leg. This happened about 2 weeks ago and since then she has had a flap of skin covering the area she has not been using any particular dressing or ointment to this wound which  has a partial layer of skin covering the lower end of it and she decided to come to the wound clinic to get some attention to it. She denies any significant drainage or discomfort or swelling Patient's history is unremarkable for any significant medical issues she is on aspirin and simvastatin prescribed by her son but she essentially has hypertension as her only medical problem. She does take a baby aspirin But is on no other blood thinners ABIs in the clinic today on the left 1.05 8/19; this is a patient with chronic venous insufficiency very fragile skin on her bilateral lower legs. She suffered a dog scratch and was admitted to the clinic last week with a skin tear. Apparently the covering flap was nonviable and was removed. She has a reasonably small clean wound remaining. 8/26; patient wound is not as viable in terms of surface we have been using silver alginate and the patient reports this got stuck to the underlying tissue when she change the dressing. Past medical history updated to note that she is a type II diabetic on oral agents 9/2; wound is smaller healthy surface. They report the Hydrofera Blue sticking to the wound will put a contact layer underneath the Hydrofera Blue. 12/04/2018 this is a patient whom I am seeing for the first time today. Unfortunately she has a skin tear to her lower extremity although this is showing signs of good improvement  which is excellent news. There is no signs of active infection at this time and overall the wound is measuring smaller today. She feels like the contact layer has done very well to keep this from sticking and that has been of benefit to her. Patient History Information obtained from Patient. Family History Cancer - Father, Heart Disease - Mother, Hypertension - Mother, Lung Disease - Mother, No family history of Diabetes, Hereditary Spherocytosis, Kidney Disease, Seizures, Stroke, Thyroid Problems, Tuberculosis. Social History Never smoker, Marital Status - Married, Alcohol Use - Daily, Drug Use - No History, Caffeine Use - Rarely. Medical History Endocrine Patient has history of Type II Diabetes Denies history of Type I Diabetes Musculoskeletal Denies history of Gout, Rheumatoid Arthritis, Osteoarthritis, Osteomyelitis [name], Gina H. ([id number]) Medical And Surgical History Notes Musculoskeletal OP Review of Systems (ROS) Constitutional Symptoms (General Health) Denies complaints or symptoms of Fatigue, Fever, Chills, Marked Weight Change. Respiratory Denies complaints or symptoms of Chronic or frequent coughs, Shortness of Breath. Cardiovascular Denies complaints or symptoms of Chest pain, LE edema. Psychiatric Denies complaints or symptoms of Anxiety, Claustrophobia. Objective Constitutional Well-nourished and well-hydrated in no acute distress. Vitals Time Taken: 2:45 PM, Height: 66 in, Weight: 125 lbs, BMI: 20.2, Temperature: 98.1 F, Pulse: 70 bpm, Respiratory Rate: 16 breaths/min, Blood Pressure: 125/69 mmHg. Respiratory normal breathing without difficulty. clear to auscultation bilaterally. Cardiovascular regular rate and rhythm with normal S1, S2. Psychiatric this patient is able to make decisions and demonstrates good insight into disease process. Alert and Oriented x 3. pleasant and cooperative. General Notes: Patient's wound VAC currently showed signs of good  granulation at this time there does not appear to be any evidence of active infection which is also good news. No sharp debridement was necessary nor performed today. Integumentary (Hair, Skin) Wound #1 status is Open. Original cause of wound was Trauma. The wound is located on the Left,Anterior Lower Leg. The wound measures 0.8cm length x 0.7cm width x 0.1cm depth; 0.44cm^2 area and 0.044cm^3 volume. There is Fat Layer (Subcutaneous Tissue) Exposed exposed. There is no tunneling or  undermining noted. There is a medium amount of serous drainage noted. The wound margin is flat and intact. There is medium (34-66%) granulation within the wound bed. There is a medium (34-66%) amount of necrotic tissue within the wound bed including Adherent Slough. Assessment [name], [name]. ([id number]) Active Problems ICD-10 Non-pressure chronic ulcer of left calf limited to breakdown of skin Chronic venous hypertension (idiopathic) without complications of left lower extremity Type 2 diabetes mellitus with other skin ulcer Plan Wound Cleansing: Wound #1 Left,Anterior Lower Leg: Clean wound with Normal Saline. Anesthetic (add to Medication List): Wound #1 Left,Anterior Lower Leg: Topical Lidocaine 4% cream applied to wound bed prior to debridement (In Clinic Only). Primary Wound Dressing: Wound #1 Left,Anterior Lower Leg: Hydrafera Blue Ready Transfer - contact layer for sticking Secondary Dressing: ABD and Kerlix/Conform - stretch net to secure Dressing Change Frequency: Wound #1 Left,Anterior Lower Leg: Change dressing every other day. Follow-up Appointments: Wound #1 Left,Anterior Lower Leg: Return Appointment in 1 week. Edema Control: Wound #1 Left,Anterior Lower Leg: Elevate legs to the level of the heart and pump ankles as often as possible 1. I would recommend currently that we continue with the [facility] dressing we will also continue with a contact layer as this seems to be keeping  her from sticking and the wound is healing quite nicely. 2. I would also suggest that we continue to secure this with con form and stretch netting as again I am concerned that if he used an adhesive bandage on her it may cause skin tearing. She was really wanting to go to an adhesive bandage but again I advised against this. We will see patient back for reevaluation in 1 week here in the clinic. If anything worsens or changes patient will contact our office for additional recommendations. Electronic Signature(s) Signed: 12/04/2018 3:01:40 PM By: Worthy Keeler PA-C Entered By: Worthy Keeler on 12/04/2018 15:01:39 [name] ([id number]) -------------------------------------------------------------------------------- ROS/PFSH Details Patient Name: [name]. Date of Service: 12/04/2018 2:45 PM Medical Record Number: [id number] Patient Account Number: [id number] Date of Birth/Sex: [birth year] (85 y.o. F) Treating RN: Montey Hora Primary Care Provider: Ramonita Lab Other Clinician: Referring Provider: Ramonita Lab Treating Provider/Extender: Melburn Hake, HOYT Weeks in Treatment: 3 Information Obtained From Patient Constitutional Symptoms (General Health) Complaints and Symptoms: Negative for: Fatigue; Fever; Chills; Marked Weight Change Respiratory Complaints and Symptoms: Negative for: Chronic or frequent coughs; Shortness of Breath Cardiovascular Complaints and Symptoms: Negative for: Chest pain; LE edema Psychiatric Complaints and Symptoms: Negative for: Anxiety; Claustrophobia Endocrine Medical History: Positive for: Type II Diabetes Negative for: Type I Diabetes Treated with: Oral agents Musculoskeletal Medical History: Negative for: Gout; Rheumatoid Arthritis; Osteoarthritis; Osteomyelitis Past Medical History Notes: OP Immunizations Pneumococcal Vaccine: Received Pneumococcal Vaccination: Yes Implantable Devices None Family and Social History Cancer: Yes -  Father; Diabetes: No; Heart Disease: Yes - Mother; Hereditary Spherocytosis: No; Hypertension: Yes - Mother; Kidney Disease: No; Lung Disease: Yes - Mother; Seizures: No; Stroke: No; Thyroid Problems: No; Tuberculosis: No; Never smoker; Marital Status - Married; Alcohol Use: Daily; Drug Use: No History; Caffeine Use: Rarely; Financial Concerns: No; Food, Clothing or Shelter Needs: No; Support System Lacking: No; Transportation Concerns: No [name], [name]. ([id number]) Physician Affirmation I have reviewed and agree with the above information. Electronic Signature(s) Signed: 12/04/2018 4:33:06 PM By: Montey Hora Signed: 12/04/2018 5:39:49 PM By: Worthy Keeler PA-C Entered By: Worthy Keeler on 12/04/2018 15:00:46 [name] ([id number]) -------------------------------------------------------------------------------- SuperBill Details Patient Name: Gina Haring  H. Date of Service: 12/04/2018 Medical Record Number: [id number] Patient Account Number: [id number] Date of Birth/Sex: 09/29/1932 (85 y.o. F) Treating RN: Montey Hora Primary Care Provider: Ramonita Lab Other Clinician: Referring Provider: Ramonita Lab Treating Provider/Extender: Melburn Hake, HOYT Weeks in Treatment: 3 Diagnosis Coding ICD-10 Codes Code Description [phone] Non-pressure chronic ulcer of left calf limited to breakdown of skin I87.302 Chronic venous hypertension (idiopathic) without complications of left lower extremity E11.622 Type 2 diabetes mellitus with other skin ulcer Facility Procedures CPT4 Code: [id number] Description: 99213 - WOUND CARE VISIT-LEV 3 EST PT Modifier: Quantity: 1 Physician Procedures CPT4 Code Description: [id number] - WC PHYS LEVEL 3 - EST PT ICD-10 Diagnosis Description L97.221 Non-pressure chronic ulcer of left calf limited to breakdown o I87.302 Chronic venous hypertension (idiopathic) without complications 0000000 Type 2  diabetes mellitus with other skin ulcer Modifier: f skin  of left lower e Quantity: 1 xtremity Electronic Signature(s) Signed: 12/04/2018 3:01:53 PM By: Worthy Keeler PA-C Entered By: Worthy Keeler on 12/04/2018 15:01:53

## 2018-12-04 NOTE — Progress Notes (Signed)
[name], [name] ([id number]) Visit Report for 12/04/2018 Arrival Information Details Patient Name: [name], [name]. Date of Service: 12/04/2018 2:45 PM Medical Record Number: [id number] Patient Account Number: [id number] Date of Birth/Sex: 1932-04-27 (85 y.o. F) Treating RN: Montey Hora Primary Care Cheikh Bramble: Ramonita Lab Other Clinician: Referring Amato Sevillano: Ramonita Lab Treating Kaleigh Spiegelman/Extender: Melburn Hake, HOYT Weeks in Treatment: 3 Visit Information History Since Last Visit Added or deleted any medications: No Patient Arrived: Ambulatory Any new allergies or adverse reactions: No Arrival Time: 14:46 Had a fall or experienced change in No Accompanied By: self activities of daily living that may affect Transfer Assistance: None risk of falls: Patient Identification Verified: Yes Signs or symptoms of abuse/neglect since last visito No Secondary Verification Process Completed: Yes Hospitalized since last visit: No Patient Has Alerts: Yes Implantable device outside of the clinic excluding No Patient Alerts: DMII cellular tissue based products placed in the center since last visit: Has Dressing in Place as Prescribed: Yes Pain Present Now: No Electronic Signature(s) Signed: 12/04/2018 4:14:48 PM By: Lorine Bears RCP, RRT, CHT Entered By: Lorine Bears on 12/04/2018 14:46:31 [name], [name] ([id number]) -------------------------------------------------------------------------------- Clinic Level of Care Assessment Details Patient Name: [name]. Date of Service: 12/04/2018 2:45 PM Medical Record Number: [id number] Patient Account Number: [id number] Date of Birth/Sex: 23-Jan-1933 (85 y.o. F) Treating RN: Montey Hora Primary Care Martez Weiand: Ramonita Lab Other Clinician: Referring Leona Alen: Ramonita Lab Treating Benn Tarver/Extender: Melburn Hake, HOYT Weeks in Treatment: 3 Clinic Level of Care Assessment Items TOOL 4 Quantity Score []  - Use when only  an EandM is performed on FOLLOW-UP visit 0 ASSESSMENTS - Nursing Assessment / Reassessment X - Reassessment of Co-morbidities (includes updates in patient status) 1 10 X- 1 5 Reassessment of Adherence to Treatment Plan ASSESSMENTS - Wound and Skin Assessment / Reassessment X - Simple Wound Assessment / Reassessment - one wound 1 5 []  - 0 Complex Wound Assessment / Reassessment - multiple wounds []  - 0 Dermatologic / Skin Assessment (not related to wound area) ASSESSMENTS - Focused Assessment []  - Circumferential Edema Measurements - multi extremities 0 []  - 0 Nutritional Assessment / Counseling / Intervention X- 1 5 Lower Extremity Assessment (monofilament, tuning fork, pulses) []  - 0 Peripheral Arterial Disease Assessment (using hand held doppler) ASSESSMENTS - Ostomy and/or Continence Assessment and Care []  - Incontinence Assessment and Management 0 []  - 0 Ostomy Care Assessment and Management (repouching, etc.) PROCESS - Coordination of Care X - Simple Patient / Family Education for ongoing care 1 15 []  - 0 Complex (extensive) Patient / Family Education for ongoing care X- 1 10 Staff obtains Programmer, systems, Records, Test Results / Process Orders []  - 0 Staff telephones HHA, Nursing Homes / Clarify orders / etc []  - 0 Routine Transfer to another Facility (non-emergent condition) []  - 0 Routine Hospital Admission (non-emergent condition) []  - 0 New Admissions / Biomedical engineer / Ordering NPWT, Apligraf, etc. []  - 0 Emergency Hospital Admission (emergent condition) X- 1 10 Simple Discharge Coordination [name], [name]. ([id number]) []  - 0 Complex (extensive) Discharge Coordination PROCESS - Special Needs []  - Pediatric / Minor Patient Management 0 []  - 0 Isolation Patient Management []  - 0 Hearing / Language / Visual special needs []  - 0 Assessment of Community assistance (transportation, D/C planning, etc.) []  - 0 Additional assistance / Altered mentation []  -  0 Support Surface(s) Assessment (bed, cushion, seat, etc.) INTERVENTIONS - Wound Cleansing / Measurement X - Simple Wound Cleansing - one wound 1 5 []  -  0 Complex Wound Cleansing - multiple wounds X- 1 5 Wound Imaging (photographs - any number of wounds) []  - 0 Wound Tracing (instead of photographs) X- 1 5 Simple Wound Measurement - one wound []  - 0 Complex Wound Measurement - multiple wounds INTERVENTIONS - Wound Dressings X - Small Wound Dressing one or multiple wounds 1 10 []  - 0 Medium Wound Dressing one or multiple wounds []  - 0 Large Wound Dressing one or multiple wounds []  - 0 Application of Medications - topical []  - 0 Application of Medications - injection INTERVENTIONS - Miscellaneous []  - External ear exam 0 []  - 0 Specimen Collection (cultures, biopsies, blood, body fluids, etc.) []  - 0 Specimen(s) / Culture(s) sent or taken to Lab for analysis []  - 0 Patient Transfer (multiple staff / Civil Service fast streamer / Similar devices) []  - 0 Simple Staple / Suture removal (25 or less) []  - 0 Complex Staple / Suture removal (26 or more) []  - 0 Hypo / Hyperglycemic Management (close monitor of Blood Glucose) []  - 0 Ankle / Brachial Index (ABI) - do not check if billed separately X- 1 5 Vital Signs [name], [name]. ([id number]) Has the patient been seen at the hospital within the last three years: Yes Total Score: 90 Level Of Care: New/Established - Level 3 Electronic Signature(s) Signed: 12/04/2018 4:33:06 PM By: Montey Hora Entered By: Montey Hora on 12/04/2018 15:00:31 [name] ([id number]) -------------------------------------------------------------------------------- Encounter Discharge Information Details Patient Name: [name]. Date of Service: 12/04/2018 2:45 PM Medical Record Number: [id number] Patient Account Number: [id number] Date of Birth/Sex: 1932/07/11 (85 y.o. F) Treating RN: Montey Hora Primary Care Chiara Coltrin: Ramonita Lab Other  Clinician: Referring Khanh Tanori: Ramonita Lab Treating Atalie Oros/Extender: Melburn Hake, HOYT Weeks in Treatment: 3 Encounter Discharge Information Items Discharge Condition: Stable Ambulatory Status: Ambulatory Discharge Destination: Home Transportation: Private Auto Accompanied By: self Schedule Follow-up Appointment: Yes Clinical Summary of Care: Electronic Signature(s) Signed: 12/04/2018 4:33:06 PM By: Montey Hora Entered By: Montey Hora on 12/04/2018 15:01:59 [name] ([id number]) -------------------------------------------------------------------------------- Lower Extremity Assessment Details Patient Name: [name]. Date of Service: 12/04/2018 2:45 PM Medical Record Number: [id number] Patient Account Number: [id number] Date of Birth/Sex: 1932/05/21 (85 y.o. F) Treating RN: Army Melia Primary Care Staci Carver: Ramonita Lab Other Clinician: Referring Mitch Arquette: Ramonita Lab Treating Carlon Chaloux/Extender: STONE III, HOYT Weeks in Treatment: 3 Edema Assessment Assessed: [Left: No] [Right: No] Edema: [Left: N] [Right: o] Vascular Assessment Pulses: Dorsalis Pedis Palpable: [Left:Yes] Electronic Signature(s) Signed: 12/04/2018 3:54:12 PM By: Army Melia Entered By: Army Melia on 12/04/2018 14:51:04 [name], [name] ([id number]) -------------------------------------------------------------------------------- Multi Wound Chart Details Patient Name: [name]. Date of Service: 12/04/2018 2:45 PM Medical Record Number: [id number] Patient Account Number: [id number] Date of Birth/Sex: 25-Aug-1932 (85 y.o. F) Treating RN: Montey Hora Primary Care Amen Dargis: Ramonita Lab Other Clinician: Referring Izac Faulkenberry: Ramonita Lab Treating Aiyonna Lucado/Extender: Melburn Hake, HOYT Weeks in Treatment: 3 Vital Signs Height(in): 66 Pulse(bpm): 70 Weight(lbs): 125 Blood Pressure(mmHg): 125/69 Body Mass Index(BMI): 20 Temperature(F): 98.1 Respiratory Rate 16 (breaths/min): Photos:  [N/A:N/A] Wound Location: Left Lower Leg - Anterior N/A N/A Wounding Event: Trauma N/A N/A Primary Etiology: Skin Tear N/A N/A Secondary Etiology: Diabetic Wound/Ulcer of the N/A N/A Lower Extremity Comorbid History: Type II Diabetes N/A N/A Date Acquired: 10/22/2018 N/A N/A Weeks of Treatment: 3 N/A N/A Wound Status: Open N/A N/A Measurements L x W x D 1.4x1x0.1 N/A N/A (cm) Area (cm) : 1.1 N/A N/A Volume (cm) : 0.11 N/A N/A % Reduction in Area: 61.10% N/A  N/A % Reduction in Volume: 61.10% N/A N/A Classification: Full Thickness Without N/A N/A Exposed Support Structures Exudate Amount: Medium N/A N/A Exudate Type: Serous N/A N/A Exudate Color: amber N/A N/A Wound Margin: Flat and Intact N/A N/A Granulation Amount: Medium (34-66%) N/A N/A Necrotic Amount: Medium (34-66%) N/A N/A Exposed Structures: Fat Layer (Subcutaneous N/A N/A Tissue) Exposed: Yes Epithelialization: None N/A N/A Treatment Notes [name], [name] ([id number]) Electronic Signature(s) Signed: 12/04/2018 4:33:06 PM By: Montey Hora Entered By: Montey Hora on 12/04/2018 14:56:37 [name] ([id number]) -------------------------------------------------------------------------------- [place] Details Patient Name: [name]. Date of Service: 12/04/2018 2:45 PM Medical Record Number: [id number] Patient Account Number: [id number] Date of Birth/Sex: 1933-01-26 (85 y.o. F) Treating RN: Montey Hora Primary Care Terica Yogi: Ramonita Lab Other Clinician: Referring Jerusalem Brownstein: Ramonita Lab Treating Carthel Castille/Extender: Melburn Hake, HOYT Weeks in Treatment: 3 Active Inactive Necrotic Tissue Nursing Diagnoses: Impaired tissue integrity related to necrotic/devitalized tissue Goals: Necrotic/devitalized tissue will be minimized in the wound bed Date Initiated: 11/07/2018 Target Resolution Date: 11/15/2018 Goal Status: Active Interventions: Assess patient pain level pre-, during and post  procedure and prior to discharge Treatment Activities: Excisional debridement : 11/07/2018 Notes: Orientation to the Wound Care Program Nursing Diagnoses: Knowledge deficit related to the wound healing center program Goals: Patient/caregiver will verbalize understanding of the [facility] Date Initiated: 11/07/2018 Target Resolution Date: 11/07/2018 Goal Status: Active Interventions: Provide education on orientation to the wound center Notes: Wound/Skin Impairment Nursing Diagnoses: Impaired tissue integrity Goals: Patient/caregiver will verbalize understanding of skin care regimen Date Initiated: 11/07/2018 Target Resolution Date: 11/07/2018 Goal Status: Active [name], [name] ([id number]) Interventions: Assess ulceration(s) every visit Treatment Activities: Referred to DME Shaan Rhoads for dressing supplies : 11/07/2018 Skin care regimen initiated : 11/07/2018 Topical wound management initiated : 11/07/2018 Notes: Electronic Signature(s) Signed: 12/04/2018 4:33:06 PM By: Montey Hora Entered By: Montey Hora on 12/04/2018 14:56:24 [name], [name] ([id number]) -------------------------------------------------------------------------------- Pain Assessment Details Patient Name: [name]. Date of Service: 12/04/2018 2:45 PM Medical Record Number: [id number] Patient Account Number: [id number] Date of Birth/Sex: May 14, 1932 (85 y.o. F) Treating RN: Montey Hora Primary Care Arianna Haydon: Ramonita Lab Other Clinician: Referring Azzan Butler: Ramonita Lab Treating Tsuneo Faison/Extender: Melburn Hake, HOYT Weeks in Treatment: 3 Active Problems Location of Pain Severity and Description of Pain Patient Has Paino No Site Locations Pain Management and Medication Current Pain Management: Electronic Signature(s) Signed: 12/04/2018 4:14:48 PM By: Lorine Bears RCP, RRT, CHT Signed: 12/04/2018 4:33:06 PM By: Montey Hora Entered By: Lorine Bears on  12/04/2018 14:46:46 [name] ([id number]) -------------------------------------------------------------------------------- Patient/Caregiver Education Details Patient Name: [name]. Date of Service: 12/04/2018 2:45 PM Medical Record Number: [id number] Patient Account Number: [id number] Date of Birth/Gender: October 03, 1932 (85 y.o. F) Treating RN: Montey Hora Primary Care Physician: Ramonita Lab Other Clinician: Referring Physician: Ramonita Lab Treating Physician/Extender: Sharalyn Ink in Treatment: 3 Education Assessment Education Provided To: Patient Education Topics Provided Wound/Skin Impairment: Handouts: Other: wound care as ordered Methods: Demonstration, Explain/Verbal Responses: State content correctly Electronic Signature(s) Signed: 12/04/2018 4:33:06 PM By: Montey Hora Entered By: Montey Hora on 12/04/2018 15:01:03 [name], [name] ([id number]) -------------------------------------------------------------------------------- Wound Assessment Details Patient Name: Gina Haring Conrad. Date of Service: 12/04/2018 2:45 PM Medical Record Number: [id number] Patient Account Number: [id number] Date of Birth/Sex: [birth year] (85 y.o. 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 Event: Trauma Wound Status: Open Date Acquired: 10/22/2018 Comorbid History: Type II Diabetes Weeks Of Treatment: 3 Clustered Wound: No Photos Wound Measurements Length: (cm) 0.8 Width: (cm) 0.7 Depth: (cm) 0.1 Area: (cm) 0.44 Volume: (cm) 0.044 % Reduction in Area: 84.4% % Reduction in Volume: 84.5% Epithelialization: None Tunneling: No Undermining: No Wound  Description Full Thickness Without Exposed Support Foul Odo Classification: Structures Slough/F Wound Margin: Flat and Intact Exudate Medium Amount: Exudate Type: Serous Exudate Color: amber r After Cleansing: No ibrino Yes Wound Bed Granulation Amount: Medium (34-66%) Exposed Structure Necrotic Amount: Medium (34-66%) Fat Layer (Subcutaneous Tissue) Exposed: Yes Necrotic Quality: Adherent Slough Treatment Notes Wound #1 (Left, Anterior Lower Leg) Notes Maina, Saren Conrad. ([id number]) contact layer, hydrofera blue, abd, conform secured with netting Electronic Signature(s) Signed: 12/04/2018 4:33:06 PM By: Montey Hora Entered By: Montey Hora on 12/04/2018 14:57:37 Kirst, [name] ([id number]) -------------------------------------------------------------------------------- Vitals Details Patient Name: [name]. Date of Service: 12/04/2018 2:45 PM Medical Record Number: [id number] Patient Account Number: [id number] Date of Birth/Sex: March 17, 1933 (85 y.o. F) Treating RN: Montey Hora Primary Care Savon Bordonaro: Ramonita Lab Other Clinician: Referring Kwabena Strutz: Ramonita Lab Treating Wade Asebedo/Extender: Melburn Hake, HOYT Weeks in Treatment: 3 Vital Signs Time Taken: 14:45 Temperature (F): 98.1 Height (in): 66 Pulse (bpm): 70 Weight (lbs): 125 Respiratory Rate (breaths/min): 16 Body Mass Index (BMI): 20.2 Blood Pressure (mmHg): 125/69 Reference Range: 80 - 120 mg / dl Electronic Signature(s) Signed: 12/04/2018 4:14:48 PM By: Lorine Bears RCP, RRT, CHT Entered By: Lorine Bears on 12/04/2018 14:48:16

## 2018-12-05 ENCOUNTER — Ambulatory Visit: Payer: Medicare Other | Admitting: Internal Medicine

## 2018-12-12 ENCOUNTER — Other Ambulatory Visit: Payer: Self-pay

## 2018-12-12 ENCOUNTER — Encounter: Payer: Medicare Other | Admitting: Internal Medicine

## 2018-12-12 DIAGNOSIS — E11622 Type 2 diabetes mellitus with other skin ulcer: Secondary | ICD-10-CM | POA: Diagnosis not present

## 2018-12-12 NOTE — Progress Notes (Signed)
[name], WASH ([id number]) Visit Report for 12/12/2018 HPI Details Patient Name: [name], [name]. Date of Service: 12/12/2018 10:45 AM Medical Record Number: [id number] Patient Account Number: [id number] Date of Birth/Sex: August 29, 1932 (85 y.o. F) Treating RN: Cornell Barman Primary Care Provider: Ramonita Lab Other Clinician: Referring Provider: Ramonita Lab Treating Provider/Extender: Tito Dine in Treatment: 5 History of Present Illness HPI Description: Pleasant 85 year old female who sustained dog scratch injury to the left anterior leg after her son's golden retriever greeted her and accidentally scratched her leg. This happened about 2 weeks ago and since then she has had a flap of skin covering the area she has not been using any particular dressing or ointment to this wound which has a partial layer of skin covering the lower end of it and she decided to come to the wound clinic to get some attention to it. She denies any significant drainage or discomfort or swelling Patient's history is unremarkable for any significant medical issues she is on aspirin and simvastatin prescribed by her son but she essentially has hypertension as her only medical problem. She does take a baby aspirin But is on no other blood thinners ABIs in the clinic today on the left 1.05 8/19; this is a patient with chronic venous insufficiency very fragile skin on her bilateral lower legs. She suffered a dog scratch and was admitted to the clinic last week with a skin tear. Apparently the covering flap was nonviable and was removed. She has a reasonably small clean wound remaining. 8/26; patient wound is not as viable in terms of surface we have been using silver alginate and the patient reports this got stuck to the underlying tissue when she change the dressing. Past medical history updated to note that she is a type II diabetic on oral agents 9/2; wound is smaller healthy surface. They report the  Hydrofera Blue sticking to the wound will put a contact layer underneath the Hydrofera Blue. 12/04/2018 this is a patient whom I am seeing for the first time today. Unfortunately she has a skin tear to her lower extremity although this is showing signs of good improvement which is excellent news. There is no signs of active infection at this time and overall the wound is measuring smaller today. She feels like the contact layer has done very well to keep this from sticking and that has been of benefit to her. 9/16; this patient has very fragile skin over her bilateral lower legs probably secondary to chronic venous insufficiency plus or minus solar induced skin damage. She has a dog scratch injury on the left medial/anterior lower leg. We have been using Hydrofera Blue making good progress Electronic Signature(s) Signed: 12/12/2018 5:11:23 PM By: Linton Ham MD Entered By: Linton Ham on 12/12/2018 13:04:45 [name] ([id number]) -------------------------------------------------------------------------------- Physical Exam Details Patient Name: Gina Gina H. Date of Service: 12/12/2018 10:45 AM Medical Record Number: [id number] Patient Account Number: [id number] Date of Birth/Sex: 10-02-1932 (85 y.o. F) Treating RN: Cornell Barman Primary Care Provider: Ramonita Lab Other Clinician: Referring Provider: Ramonita Lab Treating Provider/Extender: Ricard Dillon Weeks in Treatment: 5 Constitutional Sitting or standing Blood Pressure is within target range for patient.. Pulse regular and within target range for patient.Marland Kitchen Respirations regular, non-labored and within target range.. Temperature is normal and within the target range for the patient.Marland Kitchen appears in no distress. Respiratory Respiratory effort is easy and symmetric bilaterally. Rate is normal at rest and on room air.. Cardiovascular Pedal pulses are palpable. Good edema  control. Integumentary (Hair, Skin) No erythema around  the wound. Psychiatric No evidence of depression, anxiety, or agitation. Calm, cooperative, and communicative. Appropriate interactions and affect.. Notes Wound exam; patient is making good progress very small open area remains. We continued with the same dressing. There is no evidence of erythema around the wound Electronic Signature(s) Signed: 12/12/2018 5:11:23 PM By: Linton Ham MD Entered By: Linton Ham on 12/12/2018 13:05:50 [name] ([id number]) -------------------------------------------------------------------------------- Physician Orders Details Patient Name: [name]. Date of Service: 12/12/2018 10:45 AM Medical Record Number: [id number] Patient Account Number: [id number] Date of Birth/Sex: 05/26/1932 (85 y.o. F) Treating RN: Cornell Barman Primary Care Provider: Ramonita Lab Other Clinician: Referring Provider: Ramonita Lab Treating Provider/Extender: Tito Dine in Treatment: 5 Verbal / Phone Orders: No Diagnosis Coding Wound Cleansing Wound #1 Left,Anterior Lower Leg o Clean wound with Normal Saline. Anesthetic (add to Medication List) Wound #1 Left,Anterior Lower Leg o Topical Lidocaine 4% cream applied to wound bed prior to debridement (In Clinic Only). Primary Wound Dressing Wound #1 Left,Anterior Lower Leg o Hydrafera Blue Ready Transfer - contact layer for sticking Secondary Dressing Wound #1 Left,Anterior Lower Leg o ABD and Kerlix/Conform - stretch net secure Dressing Change Frequency Wound #1 Left,Anterior Lower Leg o Change dressing every other day. Follow-up Appointments Wound #1 Left,Anterior Lower Leg o Return Appointment in 1 week. Edema Control Wound #1 Left,Anterior Lower Leg o Elevate legs to the level of the heart and pump ankles as often as possible Electronic Signature(s) Signed: 12/12/2018 5:11:23 PM By: Linton Ham MD Signed: 12/12/2018 5:22:27 PM By: Gretta Cool, BSN, RN, CWS, Kim RN, BSN Entered  By: Gretta Cool, BSN, RN, CWS, Kim on 12/12/2018 11:36:22 [name], [name] ([id number]) -------------------------------------------------------------------------------- Problem List Details Patient Name: [name], RICHESIN H. Date of Service: 12/12/2018 10:45 AM Medical Record Number: [id number] Patient Account Number: [id number] Date of Birth/Sex: 10-Jul-1932 (85 y.o. 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 Date of Service: 12/12/2018 10:45 AM Medical Record Number: [id number] Patient Account Number: [id number] Date of Birth/Sex: [birth year] (85 y.o. F) Treating RN: Cornell Barman Primary Care Provider: Ramonita Lab Other Clinician: Referring Provider: Ramonita Lab Treating Provider/Extender: Tito Dine in Treatment: 5 Subjective History of Present Illness (HPI) Pleasant 85 year old female who sustained dog scratch injury to the left anterior leg after her son's golden retriever greeted her and accidentally scratched her leg. This happened about 2 weeks ago and since then she has had a flap of skin covering the area she has not been using any particular dressing or  ointment to this wound which has a partial layer of skin covering the lower end of it and she decided to come to the wound clinic to get some attention to it. She denies any significant drainage or discomfort or swelling Patient's history is unremarkable for any significant medical issues she is on aspirin and simvastatin prescribed by her son but  she essentially has hypertension as her only medical problem. She does take a baby aspirin But is on no other blood thinners ABIs in the clinic today on the left 1.05 8/19; this is a patient with chronic venous insufficiency very fragile skin on her bilateral lower legs. She suffered a dog scratch and was admitted to the clinic last week with a skin tear. Apparently the covering flap was nonviable and was removed. She has a reasonably small clean wound remaining. 8/26; patient wound is not as viable in terms of surface we have been using silver alginate and the patient reports this got stuck to the underlying tissue when she change the dressing. Past medical history updated to note that she is a type II diabetic on oral agents 9/2; wound is smaller healthy surface. They report the Hydrofera Blue sticking to the wound will put a contact layer underneath the Hydrofera Blue. 12/04/2018 this is a patient whom I am seeing for the first time today. Unfortunately she has a skin tear to her lower extremity although this is showing signs of good improvement which is excellent news. There is no signs of active infection at this time and overall the wound is measuring smaller today. She feels like the contact layer has done very well to keep this from sticking and that has been of benefit to her. 9/16; this patient has very fragile skin over her bilateral lower legs probably secondary to chronic venous insufficiency plus or minus solar induced skin damage. She has a dog scratch injury on the left medial/anterior lower leg. We have been using Hydrofera Blue making  good progress Objective Constitutional Sitting or standing Blood Pressure is within target range for patient.. Pulse regular and within target range for patient.Marland Kitchen Respirations regular, non-labored and within target range.. Temperature is normal and within the target range for the patient.Marland Kitchen appears in no distress. Vitals Time Taken: 10:50 AM, Height: 66 in, Weight: 125 lbs, BMI: 20.2, Temperature: 98.2 F, Pulse: 69 bpm, Respiratory Rate: 16 breaths/min, Blood Pressure: 142/63 mmHg. Respiratory [name], Gina H. ([id number]) Respiratory effort is easy and symmetric bilaterally. Rate is normal at rest and on room air.. Cardiovascular Pedal pulses are palpable. Good edema control. Psychiatric No evidence of depression, anxiety, or agitation. Calm, cooperative, and communicative. Appropriate interactions and affect.. General Notes: Wound exam; patient is making good progress very small open area remains. We continued with the same dressing. There is no evidence of erythema around the wound Integumentary (Hair, Skin) No erythema around the wound. Wound #1 status is Open. Original cause of wound was Trauma. The wound is located on the Left,Anterior Lower Leg. The wound measures 0.6cm length x 0.5cm width x 0.1cm depth; 0.236cm^2 area and 0.024cm^3 volume. There is Fat Layer (Subcutaneous Tissue) Exposed exposed. There is no tunneling or undermining noted. There is a medium amount of serous drainage noted. The wound margin is flat and intact. There is medium (34-66%) granulation within the wound bed. There is a medium (34-66%) amount of necrotic tissue within the wound bed including Adherent Slough. Assessment Active Problems ICD-10 Non-pressure chronic ulcer of left calf limited to breakdown of skin Chronic venous hypertension (idiopathic) without complications of left lower extremity Type 2 diabetes mellitus with other skin ulcer Plan Wound Cleansing: Wound #1 Left,Anterior Lower  Leg: Clean wound with Normal Saline. Anesthetic (add to Medication List): Wound #1 Left,Anterior Lower Leg: Topical Lidocaine 4% cream applied to wound bed prior to debridement (In Clinic Only). Primary Wound Dressing: Wound #1 Left,Anterior  Lower Leg: Hydrafera Blue Ready Transfer - contact layer for sticking Secondary Dressing: Wound #1 Left,Anterior Lower Leg: ABD and Kerlix/Conform - stretch net secure Dressing Change Frequency: Wound #1 Left,Anterior Lower Leg: Change dressing every other day. Follow-up Appointments: Wound #1 Left,Anterior Lower Leg: Return Appointment in 1 week. [name], [name] ([id number]) Edema Control: Wound #1 Left,Anterior Lower Leg: Elevate legs to the level of the heart and pump ankles as often as possible 1. Continue with Hydrofera Blue/ABD Curlex and conform 2. [place] she will be back for her appointment next week. May be closed by that point Electronic Signature(s) Signed: 12/12/2018 5:11:23 PM By: Linton Ham MD Entered By: Linton Ham on 12/12/2018 13:06:46 [name] ([id number]) -------------------------------------------------------------------------------- SuperBill Details Patient Name: [name]. Date of Service: 12/12/2018 Medical Record Number: [id number] Patient Account Number: [id number] Date of Birth/Sex: 06-Aug-1932 (85 y.o. F) Treating RN: Cornell Barman Primary Care Provider: Ramonita Lab Other Clinician: Referring Provider: Ramonita Lab Treating Provider/Extender: Ricard Dillon Weeks in Treatment: 5 Diagnosis Coding ICD-10 Codes Code Description [phone] Non-pressure chronic ulcer of left calf limited to breakdown of skin I87.302 Chronic venous hypertension (idiopathic) without complications of left lower extremity E11.622 Type 2 diabetes mellitus with other skin ulcer Facility Procedures CPT4 Code: [id number] Description: 99213 - WOUND CARE VISIT-LEV 3 EST  PT Modifier: Quantity: 1 Physician Procedures CPT4 Code Description: [id number] [place] - WC PHYS LEVEL 3 - EST PT ICD-10 Diagnosis Description L97.221 Non-pressure chronic ulcer of left calf limited to breakdown o I87.302 Chronic venous hypertension (idiopathic) without complications 0000000 Type 2  diabetes mellitus with other skin ulcer Modifier: f skin of left lower e Quantity: 1 xtremity Electronic Signature(s) Signed: 12/12/2018 5:11:23 PM By: Linton Ham MD Entered By: Linton Ham on 12/12/2018 13:07:11

## 2018-12-12 NOTE — Progress Notes (Signed)
[name], [name] ([id number]) Visit Report for 12/12/2018 Arrival Information Details Patient Name: [name], [name]. Date of Service: 12/12/2018 10:45 AM Medical Record Number: [id number] Patient Account Number: [id number] Date of Birth/Sex: 09/02/1932 (85 y.o. F) Treating RN: Cornell Barman Primary Care Yashar Inclan: [facility]onita [facility] Other Clinician: Referring Leonetta Mcgivern: Ramonita Lab Treating Ronelle Smallman/Extender: Tito Dine in Treatment: 5 Visit Information History Since Last Visit Added or deleted any medications: No Patient Arrived: Ambulatory Any new allergies or adverse reactions: No Arrival Time: 10:50 Had a fall or experienced change in No Accompanied By: self activities of daily living that may affect Transfer Assistance: None risk of falls: Patient Identification Verified: Yes Hospitalized since last visit: No Secondary Verification Process Completed: Yes Implantable device outside of the clinic excluding No Patient Has Alerts: Yes cellular tissue based products placed in the center Patient Alerts: DMII since last visit: Has Dressing in Place as Prescribed: Yes Pain Present Now: No Electronic Signature(s) Signed: 12/12/2018 2:23:38 PM By: Lorine Bears RCP, RRT, CHT Entered By: Becky Sax, Amado Nash on 12/12/2018 10:51:24 [name], [name] ([id number]) -------------------------------------------------------------------------------- Clinic Level of Care Assessment Details Patient Name: [name]. Date of Service: 12/12/2018 10:45 AM Medical Record Number: [id number] Patient Account Number: [id number] Date of Birth/Sex: October 14, 1932 (85 y.o. F) Treating RN: Cornell Barman Primary Care Delitha Elms: [facility] Other Clinician: Referring Jacquise Rarick: Ramonita Lab Treating Marilouise Densmore/Extender: Tito Dine in Treatment: 5 Clinic Level of Care Assessment Items TOOL 4 Quantity Score []  - Use when only an EandM is performed on FOLLOW-UP visit 0 ASSESSMENTS  - Nursing Assessment / Reassessment X - Reassessment of Co-morbidities (includes updates in patient status) 1 10 X- 1 5 Reassessment of Adherence to Treatment Plan ASSESSMENTS - Wound and Skin Assessment / Reassessment X - Simple Wound Assessment / Reassessment - one wound 1 5 []  - 0 Complex Wound Assessment / Reassessment - multiple wounds []  - 0 Dermatologic / Skin Assessment (not related to wound area) ASSESSMENTS - Focused Assessment []  - Circumferential Edema Measurements - multi extremities 0 []  - 0 Nutritional Assessment / Counseling / Intervention []  - 0 Lower Extremity Assessment (monofilament, tuning fork, pulses) []  - 0 Peripheral Arterial Disease Assessment (using hand held doppler) ASSESSMENTS - Ostomy and/or Continence Assessment and Care []  - Incontinence Assessment and Management 0 []  - 0 Ostomy Care Assessment and Management (repouching, etc.) PROCESS - Coordination of Care X - Simple Patient / Family Education for ongoing care 1 15 []  - 0 Complex (extensive) Patient / Family Education for ongoing care []  - 0 Staff obtains Programmer, systems, Records, Test Results / Process Orders []  - 0 Staff telephones HHA, Nursing Homes / Clarify orders / etc []  - 0 Routine Transfer to another Facility (non-emergent condition) []  - 0 Routine Hospital Admission (non-emergent condition) []  - 0 New Admissions / Biomedical engineer / Ordering NPWT, Apligraf, etc. []  - 0 Emergency Hospital Admission (emergent condition) X- 1 10 Simple Discharge Coordination Barbara, Carlos H. ([id number]) []  - 0 Complex (extensive) Discharge Coordination PROCESS - Special Needs []  - Pediatric / Minor Patient Management 0 []  - 0 Isolation Patient Management []  - 0 Hearing / Language / Visual special needs []  - 0 Assessment of Community assistance (transportation, D/C planning, etc.) []  - 0 Additional assistance / Altered mentation []  - 0 Support Surface(s) Assessment (bed, cushion, seat,  etc.) INTERVENTIONS - Wound Cleansing / Measurement X - Simple Wound Cleansing - one wound 1 5 []  - 0 Complex Wound Cleansing - multiple wounds X- 1  5 Wound Imaging (photographs - any number of wounds) []  - 0 Wound Tracing (instead of photographs) X- 1 5 Simple Wound Measurement - one wound []  - 0 Complex Wound Measurement - multiple wounds INTERVENTIONS - Wound Dressings []  - Small Wound Dressing one or multiple wounds 0 X- 1 15 Medium Wound Dressing one or multiple wounds []  - 0 Large Wound Dressing one or multiple wounds []  - 0 Application of Medications - topical []  - 0 Application of Medications - injection INTERVENTIONS - Miscellaneous []  - External ear exam 0 []  - 0 Specimen Collection (cultures, biopsies, blood, body fluids, etc.) []  - 0 Specimen(s) / Culture(s) sent or taken to Lab for analysis []  - 0 Patient Transfer (multiple staff / Civil Service fast streamer / Similar devices) []  - 0 Simple Staple / Suture removal (25 or less) []  - 0 Complex Staple / Suture removal (26 or more) []  - 0 Hypo / Hyperglycemic Management (close monitor of Blood Glucose) []  - 0 Ankle / Brachial Index (ABI) - do not check if billed separately X- 1 5 Vital Signs [name], Gina H. ([id number]) Has the patient been seen at the hospital within the last three years: Yes Total Score: 80 Level Of Care: New/Established - Level 3 Electronic Signature(s) Signed: 12/12/2018 5:22:27 PM By: Gretta Cool, BSN, RN, CWS, Kim RN, BSN Entered By: Gretta Cool, BSN, RN, CWS, Kim on 12/12/2018 11:36:57 [name] ([id number]) -------------------------------------------------------------------------------- Encounter Discharge Information Details Patient Name: [name]. Date of Service: 12/12/2018 10:45 AM Medical Record Number: [id number] Patient Account Number: [id number] Date of Birth/Sex: 1932/12/09 (85 y.o. F) Treating RN: Cornell Barman Primary Care Jeb Schloemer: [facility] Other Clinician: Referring Nuria Phebus: Ramonita Lab Treating Amonte Brookover/Extender: Tito Dine in Treatment: 5 Encounter Discharge Information Items Discharge Condition: Stable Ambulatory Status: Ambulatory Discharge Destination: Home Transportation: Private Auto Accompanied By: self Schedule Follow-up Appointment: Yes Clinical Summary of Care: Electronic Signature(s) Signed: 12/12/2018 5:22:27 PM By: Gretta Cool, BSN, RN, CWS, Kim RN, BSN Entered By: Gretta Cool, BSN, RN, CWS, Kim on 12/12/2018 11:37:58 [name] ([id number]) -------------------------------------------------------------------------------- Lower Extremity Assessment Details Patient Name: [name], Gina H. Date of Service: 12/12/2018 10:45 AM Medical Record Number: [id number] Patient Account Number: [id number] Date of Birth/Sex: 05-15-1932 (85 y.o. F) Treating RN: Army Melia Primary Care [name]: [facility]ita [facility] Other Clinician: Referring Diante Barley: Ramonita Lab Treating Nancie Bocanegra/Extender: Ricard Dillon Weeks in Treatment: 5 Edema Assessment Assessed: [Left: No] [Right: No] Edema: [Left: N] [Right: o] Vascular Assessment Pulses: Dorsalis Pedis Palpable: [Left:Yes] Electronic Signature(s) Signed: 12/12/2018 12:42:49 PM By: Army Melia Entered By: Army Melia on 12/12/2018 11:02:11 [name], [name] ([id number]) -------------------------------------------------------------------------------- Multi Wound Chart Details Patient Name: [name]. Date of Service: 12/12/2018 10:45 AM Medical Record Number: [id number] Patient Account Number: [id number] Date of Birth/Sex: Jun 17, 1932 (85 y.o. F) Treating RN: Cornell Barman Primary Care Omero Kowal: Ram[facility]ita Lab Other Clinician: Referring Johniece Hornbaker: Ramonita Lab Treating Jazsmine Macari/Extender: Tito Dine in Treatment: 5 Vital Signs Height(in): 66 Pulse(bpm): 69 Weight(lbs): 125 Blood Pressure(mmHg): 142/63 Body Mass Index(BMI): 20 Temperature(F): 98.2 Respiratory Rate 16 (breaths/min): Photos:  [N/A:N/A] Wound Location: Left Lower Leg - Anterior N/A N/A Wounding Event: Trauma N/A N/A Primary Etiology: Skin Tear N/A N/A Secondary Etiology: Diabetic Wound/Ulcer of the N/A N/A Lower Extremity Comorbid History: Type II Diabetes N/A N/A Date Acquired: 10/22/2018 N/A N/A Weeks of Treatment: 5 N/A N/A Wound Status: Open N/A N/A Measurements L x W x D 0.6x0.5x0.1 N/A N/A (cm) Area (cm) : 0.236 N/A N/A Volume (cm) : 0.024 N/A N/A %  Reduction in Area: 91.70% N/A N/A % Reduction in Volume: 91.50% N/A N/A Classification: Full Thickness Without N/A N/A Exposed Support Structures Exudate Amount: Medium N/A N/A Exudate Type: Serous N/A N/A Exudate Color: amber N/A N/A Wound Margin: Flat and Intact N/A N/A Granulation Amount: Medium (34-66%) N/A N/A Necrotic Amount: Medium (34-66%) N/A N/A Exposed Structures: Fat Layer (Subcutaneous N/A N/A Tissue) Exposed: Yes Epithelialization: None N/A N/A Treatment Notes Groseclose, Nicha H. ([id number]) Wound #1 (Left, Anterior Lower Leg) Notes contact layer, hydrofera blue, abd, conform secured with netting Electronic Signature(s) Signed: 12/12/2018 5:11:23 PM By: Linton Ham MD Entered By: Linton Ham on 12/12/2018 13:03:56 [name] ([id number]) -------------------------------------------------------------------------------- [facility] Details Patient Name: [name]. Date of Service: 12/12/2018 10:45 AM Medical Record Number: [id number] Patient Account Number: [id number] Date of Birth/Sex: [birth year] (85 y.o. F) Treating RN: Cornell Barman Primary Care Dawood Spitler: Ramonita Lab Other Clinician: Referring Nikhil Osei: Ramonita Lab Treating Elane Peabody/Extender: Tito Dine in Treatment: 5 Active Inactive Necrotic Tissue Nursing Diagnoses: Impaired tissue integrity related to necrotic/devitalized tissue Goals: Necrotic/devitalized tissue will be minimized in the wound bed Date Initiated:  11/07/2018 Target Resolution Date: 11/15/2018 Goal Status: Active Interventions: Assess patient pain level pre-, during and post procedure and prior to discharge Treatment Activities: Excisional debridement : 11/07/2018 Notes: Orientation to the Wound Care Program Nursing Diagnoses: Knowledge deficit related to the wound healing center program Goals: Patient/caregiver will verbalize understanding of the [facility] Date Initiated: 11/07/2018 Target Resolution Date: 11/07/2018 Goal Status: Active Interventions: Provide education on orientation to the wound center Notes: Wound/Skin Impairment Nursing Diagnoses: Impaired tissue integrity Goals: Patient/caregiver will verbalize understanding of skin care regimen Date Initiated: 11/07/2018 Target Resolution Date: 11/07/2018 Goal Status: Active KAMMIE, GEARHEART ([id number]) Interventions: Assess ulceration(s) every visit Treatment Activities: Referred to DME Letzy Gullickson for dressing supplies : 11/07/2018 Skin care regimen initiated : 11/07/2018 Topical wound management initiated : 11/07/2018 Notes: Electronic Signature(s) Signed: 12/12/2018 5:22:27 PM By: Gretta Cool, BSN, RN, CWS, Kim RN, BSN Entered By: Gretta Cool, BSN, RN, CWS, Kim on 12/12/2018 11:33:53 [name], [name] ([id number]) -------------------------------------------------------------------------------- Pain Assessment Details Patient Name: [name]. Date of Service: 12/12/2018 10:45 AM Medical Record Number: [id number] Patient Account Number: [id number] Date of Birth/Sex: [birth year] (85 y.o. F) Treating RN: Cornell Barman Primary Care Somnang Mahan: Ramonita Lab Other Clinician: Referring Tylicia Sherman: Ramonita Lab Treating Miller Limehouse/Extender: Ricard Dillon Weeks in Treatment: 5 Active Problems Location of Pain Severity and Description of Pain Patient Has Paino No Site Locations Pain Management and Medication Current Pain Management: Electronic Signature(s) Signed:  12/12/2018 2:23:38 PM By: Lorine Bears RCP, RRT, CHT Signed: 12/12/2018 5:22:27 PM By: Gretta Cool, BSN, RN, CWS, Kim RN, BSN Entered By: Lorine Bears on 12/12/2018 10:51:30 [name] ([id number]) -------------------------------------------------------------------------------- Patient/Caregiver Education Details Patient Name: [name]. Date of Service: 12/12/2018 10:45 AM Medical Record Number: [id number] Patient Account Number: [id number] Date of Birth/Gender: 1933/02/15 (85 y.o. F) Treating RN: Cornell Barman Primary Care Physician: Ramonita Lab Other Clinician: Referring Physician: Ramonita Lab Treating Physician/Extender: Tito Dine in Treatment: 5 Education Assessment Education Provided To: Patient Education Topics Provided Wound/Skin Impairment: Handouts: Caring for Your Ulcer Methods: Demonstration, Explain/Verbal Responses: State content correctly Electronic Signature(s) Signed: 12/12/2018 5:22:27 PM By: Gretta Cool, BSN, RN, CWS, Kim RN, BSN Entered By: Gretta Cool, BSN, RN, CWS, Kim on 12/12/2018 11:37:22 [name] ([id number]) -------------------------------------------------------------------------------- Wound Assessment Details Patient Name: [name]. Date of Service: 12/12/2018 10:45 AM Medical Record Number: [id number] Patient Account Number:  [id number] Date of Birth/Sex: [birth year] (85 y.o. 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 Event: Trauma Wound Status: Open Date Acquired: 10/22/2018 Comorbid History: Type II Diabetes Weeks Of Treatment: 5 Clustered Wound: No Photos Wound Measurements Length: (cm)  0.6 Width: (cm) 0.5 Depth: (cm) 0.1 Area: (cm) 0.236 Volume: (cm) 0.024 % Reduction in Area: 91.7% % Reduction in Volume: 91.5% Epithelialization: None Tunneling: No Undermining: No Wound Description Full Thickness Without Exposed Support Classification: Structures Wound Margin: Flat and Intact Exudate Medium Amount: Exudate Type: Serous Exudate Color: amber Foul Odor After Cleansing: No Slough/Fibrino Yes Wound Bed Granulation Amount: Medium (34-66%) Exposed Structure Necrotic Amount: Medium (34-66%) Fat Layer (Subcutaneous Tissue) Exposed: Yes Necrotic Quality: Adherent Slough Treatment Notes Wound #1 (Left, Anterior Lower Leg) Notes [name], Gina H. ([id number]) contact layer, hydrofera blue, abd, conform secured with netting Electronic Signature(s) Signed: 12/12/2018 12:42:49 PM By: Army Melia Entered By: Army Melia on 12/12/2018 11:00:48 [name], [name] ([id number]) -------------------------------------------------------------------------------- Vitals Details Patient Name: [name]. Date of Service: 12/12/2018 10:45 AM Medical Record Number: [id number] Patient Account Number: [id number] Date of Birth/Sex: January 02, 1933 (85 y.o. F) Treating RN: Cornell Barman Primary Care Araseli Sherry: Ramonita Lab Other Clinician: Referring Shanna Un: Ramonita Lab Treating Jamiya Nims/Extender: Tito Dine in Treatment: 5 Vital Signs Time Taken: 10:50 Temperature (F): 98.2 Height (in): 66 Pulse (bpm): 69 Weight (lbs): 125 Respiratory Rate (breaths/min): 16 Body Mass Index (BMI): 20.2 Blood Pressure (mmHg): 142/63 Reference Range: 80 - 120 mg / dl Electronic Signature(s) Signed: 12/12/2018 2:23:38 PM By: Lorine Bears RCP, RRT, CHT Entered By: Lorine Bears on 12/12/2018 10:51:59

## 2018-12-19 ENCOUNTER — Encounter: Payer: Medicare Other | Admitting: Internal Medicine

## 2018-12-19 ENCOUNTER — Other Ambulatory Visit: Payer: Self-pay

## 2018-12-19 DIAGNOSIS — E11622 Type 2 diabetes mellitus with other skin ulcer: Secondary | ICD-10-CM | POA: Diagnosis not present

## 2018-12-20 NOTE — Progress Notes (Signed)
MYHA, MECHANIC ([id number]) Visit Report for 12/19/2018 HPI Details Patient Name: [name], [name]. Date of Service: 12/19/2018 8:45 AM Medical Record Number: [id number] Patient Account Number: [id number] Date of Birth/Sex: [birth year] (85 y.o. F) Treating RN: Cornell Barman Primary Care Provider: Ramonita Lab Other Clinician: Referring Provider: Ramonita Lab Treating Provider/Extender: Tito Dine in Treatment: 6 History of Present Illness HPI Description: Pleasant 85 year old female who sustained dog scratch injury to the left anterior leg after her son's golden retriever greeted her and accidentally scratched her leg. This happened about 2 weeks ago and since then she has had a flap of skin covering the area she has not been using any particular dressing or ointment to this wound which has a partial layer of skin covering the lower end of it and she decided to come to the wound clinic to get some attention to it. She denies any significant drainage or discomfort or swelling Patient's history is unremarkable for any significant medical issues she is on aspirin and simvastatin prescribed by her son but she essentially has hypertension as her only medical problem. She does take a baby aspirin But is on no other blood thinners ABIs in the clinic today on the left 1.05 8/19; this is a patient with chronic venous insufficiency very fragile skin on her bilateral lower legs. She suffered a dog scratch and was admitted to the clinic last week with a skin tear. Apparently the covering flap was nonviable and was removed. She has a reasonably small clean wound remaining. 8/26; patient wound is not as viable in terms of surface we have been using silver alginate and the patient reports this got stuck to the underlying tissue when she change the dressing. Past medical history updated to note that she is a type II diabetic on oral agents 9/2; wound is smaller healthy surface. They report the  Hydrofera Blue sticking to the wound will put a contact layer underneath the Hydrofera Blue. 12/04/2018 this is a patient whom I am seeing for the first time today. Unfortunately she has a skin tear to her lower extremity although this is showing signs of good improvement which is excellent news. There is no signs of active infection at this time and overall the wound is measuring smaller today. She feels like the contact layer has done very well to keep this from sticking and that has been of benefit to her. 9/16; this patient has very fragile skin over her bilateral lower legs probably secondary to chronic venous insufficiency plus or minus solar induced skin damage. She has a dog scratch injury on the left medial/anterior lower leg. We have been using Hydrofera Blue making good progress. 9/23; the patient is closed and epithelialized. She does not have much erythema but has very fragile skin in the setting of probably chronic venous insufficiency plus or minus solar induced skin damage. I have suggested support stockings to help protect her skin. Electronic Signature(s) Signed: 12/19/2018 5:27:47 PM By: Linton Ham MD Entered By: Linton Ham on 12/19/2018 09:04:58 [name] ([id number]) -------------------------------------------------------------------------------- Physical Exam Details Patient Name: [name]. Date of Service: 12/19/2018 8:45 AM Medical Record Number: [id number] Patient Account Number: [id number] Date of Birth/Sex: 15-Jan-1933 (85 y.o. F) Treating RN: Cornell Barman Primary Care Provider: Ramonita Lab Other Clinician: Referring Provider: Ramonita Lab Treating Provider/Extender: Ricard Dillon Weeks in Treatment: 6 Constitutional Sitting or standing Blood Pressure is within target range for patient.. Pulse regular and within target range for patient.Marland Kitchen Respirations regular, non-labored  and within target range.. Temperature is normal and within the target  range for the patient.Marland Kitchen appears in no distress. Cardiovascular Pedal pulses palpable. Edema is minimal. Lymphatic . Integumentary (Hair, Skin) Hemosiderin deposition very fragile skin in the lower extremities but there is no current open area. Notes Wound exam; the patient is entirely closed. Electronic Signature(s) Signed: 12/19/2018 5:27:47 PM By: Linton Ham MD Entered By: Linton Ham on 12/19/2018 [id number] [name] ([id number]) -------------------------------------------------------------------------------- Physician Orders Details Patient Name: [name]. Date of Service: 12/19/2018 8:45 AM Medical Record Number: [id number] Patient Account Number: [id number] Date of Birth/Sex: [birth year] (85 y.o. F) Treating RN: Cornell Barman Primary Care Provider: Ramonita Lab Other Clinician: Referring Provider: Ramonita Lab Treating Provider/Extender: Tito Dine in Treatment: 6 Verbal / Phone Orders: No Diagnosis Coding Discharge From [facility] Services o Discharge from [place] Complete Electronic Signature(s) Signed: 12/19/2018 5:27:47 PM By: Linton Ham MD Signed: 12/19/2018 5:53:54 PM By: Gretta Cool, BSN, RN, CWS, Kim RN, BSN Entered By: Gretta Cool, BSN, RN, CWS, Kim on 12/19/2018 09:02:18 [name] ([id number]) -------------------------------------------------------------------------------- Problem List Details Patient Name: [name], [name]. Date of Service: 12/19/2018 8:45 AM Medical Record Number: [id number] Patient Account Number: [id number] Date of Birth/Sex: 1932-07-24 (85 y.o. 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 Date of Service: 12/19/2018 8:45 AM Medical Record Number: [id number] Patient Account Number: [id number] Date of Birth/Sex: [birth year] (85 y.o. F) Treating RN: Cornell Barman Primary Care Provider: Ramonita Lab Other Clinician: Referring Provider: Ramonita Lab Treating Provider/Extender: Tito Dine in Treatment: 6 Subjective History of Present Illness (HPI) Pleasant 85 year old female who sustained dog scratch injury to the left anterior leg after her son's golden retriever greeted her and accidentally scratched her leg. This happened about 2 weeks ago and since then she has had a flap of skin covering the area she has not been using any particular dressing or ointment to this wound which has a partial layer of skin covering the lower end of it and she decided to come to the wound clinic to get some attention to it. She denies any significant drainage or discomfort or swelling Patient's history is unremarkable for any significant medical issues she is on aspirin and simvastatin prescribed by her son but she essentially has hypertension as her only medical problem. She does take a baby aspirin But is on no other blood thinners ABIs in the clinic today on the left 1.05 8/19; this is a patient with chronic venous insufficiency very fragile skin on her bilateral lower legs. She suffered a dog scratch and was admitted to the clinic last week with a skin tear. Apparently the  covering flap was nonviable and was removed. She has a reasonably small clean wound remaining. 8/26; patient wound is not as viable in terms of surface we have been using silver alginate and the patient reports this got stuck  to the underlying tissue when she change the dressing. Past medical history updated to note that she is a type II diabetic on oral agents 9/2; wound is smaller healthy surface. They report the Hydrofera Blue sticking to the wound will put a contact layer underneath the Hydrofera Blue. 12/04/2018 this is a patient whom I am seeing for the first time today. Unfortunately she has a skin tear to her lower extremity although this is showing signs of good improvement which is excellent news. There is no signs of active infection at this time and overall the wound is measuring smaller today. She feels like the contact layer has done very well to keep this from sticking and that has been of benefit to her. 9/16; this patient has very fragile skin over her bilateral lower legs probably secondary to chronic venous insufficiency plus or minus solar induced skin damage. She has a dog scratch injury on the left medial/anterior lower leg. We have been using Hydrofera Blue making good progress. 9/23; the patient is closed and epithelialized. She does not have much erythema but has very fragile skin in the setting of probably chronic venous insufficiency plus or minus solar induced skin damage. I have suggested support stockings to help protect her skin. Objective Constitutional Sitting or standing Blood Pressure is within target range for patient.. Pulse regular and within target range for patient.Marland Kitchen Respirations regular, non-labored and within target range.. Temperature is normal and within the target range for the patient.Marland Kitchen appears in no distress. Vitals Time Taken: 8:50 AM, Height: 66 in, Weight: 125 lbs, BMI: 20.2, Temperature: 98.2 F, Pulse: 66 bpm, Respiratory Rate: 16 breaths/min,  Blood Pressure: 131/61 mmHg. [name], Gina H. ([id number]) Cardiovascular Pedal pulses palpable. Edema is minimal. General Notes: Wound exam; the patient is entirely closed. Integumentary (Hair, Skin) Hemosiderin deposition very fragile skin in the lower extremities but there is no current open area. Wound #1 status is Healed - Epithelialized. Original cause of wound was Trauma. The wound is located on the Left,Anterior Lower Leg. The wound measures 0cm length x 0cm width x 0cm depth; 0cm^2 area and 0cm^3 volume. There is Fat Layer (Subcutaneous Tissue) Exposed exposed. There is no tunneling or undermining noted. There is a none present amount of drainage noted. The wound margin is flat and intact. There is no granulation within the wound bed. There is a medium (34- 66%) amount of necrotic tissue within the wound bed including Eschar. Assessment Active Problems ICD-10 Non-pressure chronic ulcer of left calf limited to breakdown of skin Chronic venous hypertension (idiopathic) without complications of left lower extremity Type 2 diabetes mellitus with other skin ulcer Plan Discharge From [facility] Services: Discharge from [facility] Complete 1. Patient to be discharged from the wound care center 2. I have suggested support stockings for now to be worn during the day to protect her from the incidental traumas of everyday living. I am not sure she will actually go through with this. I have warned her that it will not take much to reopen the wound and the skin in her lower legs Electronic Signature(s) Signed: 12/19/2018 5:27:47 PM By: Linton Ham MD Entered By: Linton Ham on 12/19/2018 [place], [place]. ([id number]) -------------------------------------------------------------------------------- [place] Details Patient Name: [name]. Date of Service: 12/19/2018 Medical Record Number: [id number] Patient Account Number: [id number] Date of Birth/Sex:  18-Mar-1933 (85 y.o. F) Treating RN: Cornell Barman Primary Care Provider: Ramonita Lab Other Clinician: Referring Provider: Ramonita Lab Treating Provider/Extender: Tito Dine  in Treatment: 6 Diagnosis Coding ICD-10 Codes Code Description [phone] Non-pressure chronic ulcer of left calf limited to breakdown of skin I87.302 Chronic venous hypertension (idiopathic) without complications of left lower extremity E11.622 Type 2 diabetes mellitus with other skin ulcer Facility Procedures CPT4 Code: [id number] Description: [id number] - WOUND CARE VISIT-LEV 2 EST PT Modifier: Quantity: 1 Physician Procedures CPT4 Code Description: [id number] - WC PHYS LEVEL 2 - EST PT ICD-10 Diagnosis Description L97.221 Non-pressure chronic ulcer of left calf limited to breakdown o I87.302 Chronic venous hypertension (idiopathic) without complications Modifier: f skin of left lower e Quantity: 1 xtremity Electronic Signature(s) Signed: 12/19/2018 5:27:47 PM By: Linton Ham MD Entered By: Linton Ham on 12/19/2018 09:07:29

## 2018-12-20 NOTE — Progress Notes (Signed)
ZSOFIA, TKACHENKO ([id number]) Visit Report for 12/19/2018 Arrival Information Details Patient Name: [name], [name]. Date of Service: 12/19/2018 8:45 AM Medical Record Number: [id number] Patient Account Number: [id number] Date of Birth/Sex: 1932/10/13 (85 y.o. F) Treating RN: [name] Primary Care [name]: [name] Other Clinician: Referring [name]: [name] Treating [name]/Extender: [name] in Treatment: 6 Visit Information History Since Last Visit Added or deleted any medications: No Patient Arrived: Ambulatory Any new allergies or adverse reactions: No Arrival Time: 08:49 Had a fall or experienced change in No Accompanied By: self activities of daily living that may affect Transfer Assistance: None risk of falls: Patient Identification Verified: Yes Signs or symptoms of abuse/neglect since last visito No Secondary Verification Process Completed: Yes Hospitalized since last visit: No Patient Has Alerts: Yes Implantable device outside of the clinic excluding No Patient Alerts: DMII cellular tissue based products placed in the center since last visit: Has Dressing in Place as Prescribed: Yes Pain Present Now: No Electronic Signature(s) Signed: 12/19/2018 3:01:14 PM By: [name] RCP, RRT, CHT Entered By: [name] on 12/19/2018 08:50:32 [place], [name] ([id number]) -------------------------------------------------------------------------------- Clinic Level of Care Assessment Details Patient Name: [name]. Date of Service: 12/19/2018 8:45 AM Medical Record Number: [id number] Patient Account Number: [id number] Date of Birth/Sex: 21-Mar-1933 (85 y.o. F) Treating RN: [name] Primary Care Jacquise Rarick: [name] Other Clinician: Referring Oreste Majeed: [name] Treating Zaeda Mcferran/Extender: [name] in Treatment: 6 Clinic Level of Care Assessment Items TOOL 4 Quantity Score []  - Use when only  an EandM is performed on FOLLOW-UP visit 0 ASSESSMENTS - Nursing Assessment / Reassessment X - Reassessment of Co-morbidities (includes updates in patient status) 1 10 []  - 0 Reassessment of Adherence to Treatment Plan ASSESSMENTS - Wound and Skin Assessment / Reassessment X - Simple Wound Assessment / Reassessment - one wound 1 5 []  - 0 Complex Wound Assessment / Reassessment - multiple wounds []  - 0 Dermatologic / Skin Assessment (not related to wound area) ASSESSMENTS - Focused Assessment []  - Circumferential Edema Measurements - multi extremities 0 []  - 0 Nutritional Assessment / Counseling / Intervention []  - 0 Lower Extremity Assessment (monofilament, tuning fork, pulses) []  - 0 Peripheral Arterial Disease Assessment (using hand held doppler) ASSESSMENTS - Ostomy and/or Continence Assessment and Care []  - Incontinence Assessment and Management 0 []  - 0 Ostomy Care Assessment and Management (repouching, etc.) PROCESS - Coordination of Care X - Simple Patient / Family Education for ongoing care 1 15 []  - 0 Complex (extensive) Patient / Family Education for ongoing care []  - 0 Staff obtains Programmer, systems, Records, Test Results / Process Orders []  - 0 Staff telephones HHA, Nursing Homes / Clarify orders / etc []  - 0 Routine Transfer to another Facility (non-emergent condition) []  - 0 Routine Hospital Admission (non-emergent condition) []  - 0 New Admissions / Biomedical engineer / Ordering NPWT, Apligraf, etc. []  - 0 Emergency Hospital Admission (emergent condition) X- 1 10 Simple Discharge Coordination Pelc, Gina H. ([id number]) []  - 0 Complex (extensive) Discharge Coordination PROCESS - Special Needs []  - Pediatric / Minor Patient Management 0 []  - 0 Isolation Patient Management []  - 0 Hearing / Language / Visual special needs []  - 0 Assessment of Community assistance (transportation, D/C planning, etc.) []  - 0 Additional assistance / Altered mentation []  -  0 Support Surface(s) Assessment (bed, cushion, seat, etc.) INTERVENTIONS - Wound Cleansing / Measurement []  - Simple Wound Cleansing - one wound 0 []  - 0  Complex Wound Cleansing - multiple wounds X- 1 5 Wound Imaging (photographs - any number of wounds) []  - 0 Wound Tracing (instead of photographs) []  - 0 Simple Wound Measurement - one wound []  - 0 Complex Wound Measurement - multiple wounds INTERVENTIONS - Wound Dressings []  - Small Wound Dressing one or multiple wounds 0 []  - 0 Medium Wound Dressing one or multiple wounds []  - 0 Large Wound Dressing one or multiple wounds []  - 0 Application of Medications - topical []  - 0 Application of Medications - injection INTERVENTIONS - Miscellaneous []  - External ear exam 0 []  - 0 Specimen Collection (cultures, biopsies, blood, body fluids, etc.) []  - 0 Specimen(s) / Culture(s) sent or taken to Conrad for analysis []  - 0 Patient Transfer (multiple staff / Civil Service fast streamer / Similar devices) []  - 0 Simple Staple / Suture removal (25 or less) []  - 0 Complex Staple / Suture removal (26 or more) []  - 0 Hypo / Hyperglycemic Management (close monitor of Blood Glucose) []  - 0 Ankle / Brachial Index (ABI) - do not check if billed separately X- 1 5 Vital Signs [name], Gina H. ([id number]) Has the patient been seen at the hospital within the last three years: Yes Total Score: 50 Level Of Care: New/Established - Level 2 Electronic Signature(s) Signed: 12/19/2018 5:53:54 PM By: [name], BSN, RN, CWS, Kim RN, BSN Entered By: [name], BSN, RN, CWS, Kim on 12/19/2018 09:04:41 [name] ([id number]) -------------------------------------------------------------------------------- Encounter Discharge Information Details Patient Name: [name]. Date of Service: 12/19/2018 8:45 AM Medical Record Number: [id number] Patient Account Number: [id number] Date of Birth/Sex: May 25, 1932 (85 y.o. F) Treating RN: [name] Primary Care Abelino Tippin:  [name] Other Clinician: Referring Hae Ahlers: [name] Treating Hajer Dwyer/Extender: [name] in Treatment: 6 Encounter Discharge Information Items Discharge Condition: Stable Ambulatory Status: Ambulatory Discharge Destination: Home Transportation: Private Auto Accompanied By: self Schedule Follow-up Appointment: Yes Clinical Summary of Care: Electronic Signature(s) Signed: 12/19/2018 5:53:54 PM By: [name], BSN, RN, CWS, Kim RN, BSN Entered By: [name], BSN, RN, CWS, Kim on 12/19/2018 09:05:50 [name] ([id number]) -------------------------------------------------------------------------------- Lower Extremity Assessment Details Patient Name: [name]. Date of Service: 12/19/2018 8:45 AM Medical Record Number: [id number] Patient Account Number: [id number] Date of Birth/Sex: 10-15-1932 (85 y.o. F) Treating RN: [name] Primary Care Pam Vanalstine: [name] Other Clinician: Referring Diala Waxman: [name] Treating Marigene Erler/Extender: [name] in Treatment: 6 Edema Assessment Assessed: [Left: No] [Right: No] Edema: [Left: N] [Right: o] Vascular Assessment Pulses: Dorsalis Pedis Palpable: [Left:Yes] Electronic Signature(s) Signed: 12/19/2018 10:47:16 AM By: [name] Entered By: [name] on 12/19/2018 08:52:28 [name], [name] ([id number]) -------------------------------------------------------------------------------- Multi Wound Chart Details Patient Name: Gina Haring H. Date of Service: 12/19/2018 8:45 AM Medical Record Number: [id number] Patient Account Number: [id number] Date of Birth/Sex: [birth year] (85 y.o. F) Treating RN: [name] Primary Care Lashawnda Hancox: [name] Other Clinician: Referring Syrai Gladwin: [name] Treating Ikea Demicco/Extender: [name] in Treatment: 6 Vital Signs Height(in): 66 Pulse(bpm): 66 Weight(lbs): 125 Blood Pressure(mmHg): 131/61 Body Mass Index(BMI): 20 Temperature(F):  98.2 Respiratory Rate 16 (breaths/min): Photos: [N/A:N/A] Wound Location: Left, Anterior Lower Leg N/A N/A Wounding Event: Trauma N/A N/A Primary Etiology: Skin Tear N/A N/A Secondary Etiology: Diabetic Wound/Ulcer of the N/A N/A Lower Extremity Comorbid History: Type II Diabetes N/A N/A Date Acquired: 10/22/2018 N/A N/A Conrad of Treatment: 6 N/A N/A Wound Status: Healed - Epithelialized N/A N/A Measurements L x W x D 0x0x0 N/A N/A (cm) Area (cm) :  0 N/A N/A Volume (cm) : 0 N/A N/A % Reduction in Area: 100.00% N/A N/A % Reduction in Volume: 100.00% N/A N/A Classification: Full Thickness Without N/A N/A Exposed Support Structures Exudate Amount: None Present N/A N/A Wound Margin: Flat and Intact N/A N/A Granulation Amount: None Present (0%) N/A N/A Necrotic Amount: Medium (34-66%) N/A N/A Necrotic Tissue: Eschar N/A N/A Exposed Structures: Fat Layer (Subcutaneous N/A N/A Tissue) Exposed: Yes Epithelialization: Large (67-100%) N/A N/A Treatment Notes AUDA, [name] ([id number]) Electronic Signature(s) Signed: 12/19/2018 5:27:47 PM By: Linton Ham MD Entered By: Linton Ham on 12/19/2018 09:03:56 [name] ([id number]) -------------------------------------------------------------------------------- [place] Details Patient Name: [name]. Date of Service: 12/19/2018 8:45 AM Medical Record Number: [id number] Patient Account Number: [id number] Date of Birth/Sex: 1932-12-31 (85 y.o. F) Treating RN: [name] Primary Care Dick Hark: [name] Other Clinician: Referring Amrit Cress: [name] Treating Reinhard Schack/Extender: [name] in Treatment: 6 Active Inactive Electronic Signature(s) Signed: 12/19/2018 5:53:54 PM By: [name], BSN, RN, CWS, Kim RN, BSN Entered By: [name], BSN, RN, CWS, Kim on 12/19/2018 09:01:28 [name]  ([id number]) -------------------------------------------------------------------------------- Pain Assessment Details Patient Name: [name]. Date of Service: 12/19/2018 8:45 AM Medical Record Number: [id number] Patient Account Number: [id number] Date of Birth/Sex: Jan 07, 1933 (85 y.o. F) Treating RN: [name] Primary Care Otha Monical: [name] Other Clinician: Referring Marien Manship: [name] Treating Vallorie Niccoli/Extender: [name] in Treatment: 6 Active Problems Location of Pain Severity and Description of Pain Patient Has Paino No Site Locations Pain Management and Medication Current Pain Management: Electronic Signature(s) Signed: 12/19/2018 3:01:14 PM By: [name], RRT, CHT Signed: 12/19/2018 5:53:54 PM By: [name], BSN, RN, CWS, Kim RN, BSN Entered By: [name] on 12/19/2018 08:50:44 [name] ([id number]) -------------------------------------------------------------------------------- Patient/Caregiver Education Details Patient Name: [name]. Date of Service: 12/19/2018 8:45 AM Medical Record Number: [id number] Patient Account Number: [id number] Date of Birth/Gender: [birth year] (85 y.o. F) Treating RN: [name] Primary Care Physician: [name] Other Clinician: Referring Physician: Ramonita Conrad Treating Physician/Extender: [name] in Treatment: 6 Education Assessment Education Provided To: Patient Education Topics Provided Wound/Skin Impairment: Handouts: Other: moisturize and protect skin. Electronic Signature(s) Signed: 12/19/2018 5:53:54 PM By: [name], BSN, RN, CWS, Kim RN, BSN Entered By: [name], BSN, RN, CWS, Kim on 12/19/2018 09:05:34 [name] ([id number]) -------------------------------------------------------------------------------- Wound Assessment Details Patient Name: [name]. Date of Service: 12/19/2018 8:45 AM Medical Record Number: [id number] Patient  Account Number: [id number] Date of Birth/Sex: 02/19/1933 (85 y.o. F) Treating RN: [name] Primary Care Claudia Greenley: [name] Other Clinician: Referring Dacey Milberger: [name] Treating Katalaya Beel/Extender: [name] in Treatment: 6 Wound Status Wound Number: 1 Primary Etiology: Skin Tear Wound Location: Left, Anterior Lower Leg Secondary Diabetic Wound/Ulcer of the Lower Etiology: Extremity Wounding Event: Trauma Wound Status: Healed - Epithelialized Date Acquired: 10/22/2018 Comorbid History: Type II Diabetes Conrad Of Treatment: 6 Clustered Wound: No Photos Wound Measurements Length: (cm) 0 % Redu Width: (cm) 0 % Redu Depth: (cm) 0 Epithe Area: (cm) 0 Tunne Volume: (cm) 0 Under ction in Area: 100% ction in Volume: 100% lialization: Large (67-100%) ling: No mining: No Wound Description Full Thickness Without Exposed Support Classification: Structures Wound Margin: Flat and Intact Exudate None Present Amount: Foul Odor After Cleansing: No Slough/Fibrino Yes Wound Bed Granulation Amount: None Present (0%) Exposed Structure Necrotic Amount: Medium (34-66%) Fat Layer (Subcutaneous Tissue) Exposed: Yes Necrotic Quality: Eschar Electronic Signature(s) Signed: 12/19/2018 5:53:54 PM By: [name],  BSN, RN, CWS, Kim RN, BSN Entered By: [name], BSN, RN, CWS, Kim on 12/19/2018 09:01:13 [name] ([id number]) -------------------------------------------------------------------------------- [place] Details Patient Name: [name] Date of Service: 12/19/2018 8:45 AM Medical Record Number: [id number] Patient Account Number: [id number] Date of Birth/Sex: 05-13-1932 (85 y.o. F) Treating RN: [name] Primary Care Ashvik Grundman: [name] Other Clinician: Referring Kahealani Yankovich: [name] Treating Maki Hege/Extender: [name] in Treatment: 6 Vital Signs Time Taken: 08:50 Temperature (F): 98.2 Height (in): 66 Pulse (bpm): 66 Weight (lbs):  125 Respiratory Rate (breaths/min): 16 Body Mass Index (BMI): 20.2 Blood Pressure (mmHg): 131/61 Reference Range: 80 - 120 mg / dl Electronic Signature(s) Signed: 12/19/2018 3:01:14 PM By: [name] RCP, RRT, CHT Entered By: [name] on 12/19/2018 08:51:06

## 2018-12-26 ENCOUNTER — Ambulatory Visit: Payer: Medicare Other | Admitting: Internal Medicine

## 2018-12-27 ENCOUNTER — Ambulatory Visit
Admission: RE | Admit: 2018-12-27 | Discharge: 2018-12-27 | Disposition: A | Discharge status: Home / Self Care | Payer: Medicare Other | Source: Ambulatory Visit | Attending: Internal Medicine | Admitting: Internal Medicine

## 2018-12-27 DIAGNOSIS — Z1231 Encounter for screening mammogram for malignant neoplasm of breast: Secondary | ICD-10-CM

## 2019-02-06 ENCOUNTER — Other Ambulatory Visit: Payer: Self-pay

## 2019-02-06 ENCOUNTER — Emergency Department
Admission: EM | Admit: 2019-02-06 | Discharge: 2019-02-06 | Disposition: A | Discharge status: Home / Self Care | Payer: Medicare Other | Attending: Emergency Medicine | Admitting: Emergency Medicine

## 2019-02-06 ENCOUNTER — Emergency Department: Payer: Medicare Other

## 2019-02-06 ENCOUNTER — Encounter: Payer: Self-pay | Admitting: Emergency Medicine

## 2019-02-06 DIAGNOSIS — Y999 Unspecified external cause status: Secondary | ICD-10-CM | POA: Diagnosis not present

## 2019-02-06 DIAGNOSIS — I10 Essential (primary) hypertension: Secondary | ICD-10-CM | POA: Diagnosis not present

## 2019-02-06 DIAGNOSIS — E119 Type 2 diabetes mellitus without complications: Secondary | ICD-10-CM | POA: Insufficient documentation

## 2019-02-06 DIAGNOSIS — S0101XA Laceration without foreign body of scalp, initial encounter: Secondary | ICD-10-CM | POA: Diagnosis not present

## 2019-02-06 DIAGNOSIS — S0990XA Unspecified injury of head, initial encounter: Secondary | ICD-10-CM

## 2019-02-06 DIAGNOSIS — Z79899 Other long term (current) drug therapy: Secondary | ICD-10-CM | POA: Insufficient documentation

## 2019-02-06 DIAGNOSIS — Y92019 Unspecified place in single-family (private) house as the place of occurrence of the external cause: Secondary | ICD-10-CM | POA: Insufficient documentation

## 2019-02-06 DIAGNOSIS — Z7982 Long term (current) use of aspirin: Secondary | ICD-10-CM | POA: Diagnosis not present

## 2019-02-06 DIAGNOSIS — W010XXA Fall on same level from slipping, tripping and stumbling without subsequent striking against object, initial encounter: Secondary | ICD-10-CM | POA: Insufficient documentation

## 2019-02-06 DIAGNOSIS — S81811A Laceration without foreign body, right lower leg, initial encounter: Secondary | ICD-10-CM | POA: Diagnosis not present

## 2019-02-06 DIAGNOSIS — S81812A Laceration without foreign body, left lower leg, initial encounter: Secondary | ICD-10-CM

## 2019-02-06 DIAGNOSIS — Y9301 Activity, walking, marching and hiking: Secondary | ICD-10-CM | POA: Insufficient documentation

## 2019-02-06 NOTE — Discharge Instructions (Signed)
Follow discharge care instructions and have staples removed in 10 days.

## 2019-02-06 NOTE — ED Triage Notes (Signed)
C/O tripping this morning and falling backward, hitting head on hardwood floor.  No LOC>  AAOx3.  Skin warm and dry. NAD

## 2019-02-06 NOTE — ED Notes (Signed)
See triage note  States she hit her toe and fell backward  Hitting her head  Denies any LOC

## 2019-02-06 NOTE — ED Provider Notes (Signed)
[facility] Emergency Department Provider Note   ____________________________________________   First MD Initiated Contact with Patient 02/06/19 1044     (approximate)  I have reviewed the triage vital signs and the nursing notes.   HISTORY  Chief Complaint Fall    HPI [name] is a 85 y.o. female patient here today for evaluation of head injury which occurred this morning.  Patient that she tripped and fell backwards hitting her head.  Patient denies LOC.  Patient state only mild headache.  Patient staying a small laceration to the left occipital area of the scalp.  Patient denies pain at this time.  Patient also has anterior to the left lower leg.         Past Medical History:  Diagnosis Date  . Adenomatous polyps    on colonoscopy  . Essential hypertension, benign   . Insomnia   . Menopausal syndrome   . Other and unspecified hyperlipidemia   . Type II or unspecified type diabetes mellitus without mention of complication, not stated as uncontrolled     Patient Active Problem List   Diagnosis Date Noted  . HTN (hypertension) 11/08/2010  . Hyperlipidemia 11/08/2010  . Chest discomfort 11/08/2010  . Diabetes mellitus 11/08/2010    Past Surgical History:  Procedure Laterality Date  . MENISCUS REPAIR      Prior to Admission medications   Medication Sig Start Date End Date Taking? Authorizing Provider  aspirin 81 MG EC tablet Take 81 mg by mouth daily.      [provider]  irbesartan-hydrochlorothiazide (AVALIDE) 150-12.5 MG per tablet Take 1 tablet by mouth daily.      [provider]  metformin (FORTAMET) 500 MG (OSM) 24 hr tablet Take 500 mg by mouth daily with breakfast.      [provider]  PARoxetine (PAXIL-CR) 12.5 MG 24 hr tablet Take 12.5 mg by mouth every morning.      [provider]  simvastatin (ZOCOR) 40 MG tablet Take 40 mg by mouth at bedtime.      [provider]     Allergies Tetanus toxoids  Family History  Problem Relation Age of Onset  . Heart disease Mother   . Hyperlipidemia Mother   . Hypertension Mother   . Breast cancer Neg Hx     Social History Social History   Tobacco Use  . Smoking status: Never Smoker  Substance Use Topics  . Alcohol use: Yes    Alcohol/week: 1.0 standard drinks    Types: 1 drink(s) per week  . Drug use: No    Review of Systems  Constitutional: No fever/chills Eyes: No visual changes. ENT: No sore throat. Cardiovascular: Denies chest pain. Respiratory: Denies shortness of breath. Gastrointestinal: No abdominal pain.  No nausea, no vomiting.  No diarrhea.  No constipation. Genitourinary: Negative for dysuria. Musculoskeletal: Negative for back pain. Skin: Negative for rash. Neurological: Positive for headaches, but denies focal weakness or numbness. Endocrine:  Diabetes, hyperlipidemia, and hypertension. Allergic/Immunilogical: Tetanus toxoid ____________________________________________   PHYSICAL EXAM:  VITAL SIGNS: ED Triage Vitals  Enc Vitals Group     BP 02/06/19 1044 (!) 155/70     Pulse Rate 02/06/19 1044 79     Resp 02/06/19 1044 18     Temp 02/06/19 1044 98.1 F (36.7 C)     Temp Source 02/06/19 1044 Oral     SpO2 02/06/19 1044 98 %     Weight 02/06/19 1035 136 lb 14.5 oz (  62.1 kg)     Height 02/06/19 1045 5\' 6"  (1.676 m)     Head Circumference --      Peak Flow --      Pain Score 02/06/19 1035 0     Pain Loc --      Pain Edu? --      Excl. in [place]? --     Constitutional: Alert and oriented. Well appearing and in no acute distress. Eyes: Conjunctivae are normal. PERRL. EOMI. Head: Atraumatic. Nose: No congestion/rhinnorhea. Mouth/Throat: Mucous membranes are moist.  Oropharynx non-erythematous. Neck: No cervical spine tenderness to palpation. Hematological/Lymphatic/Immunilogical: No cervical lymphadenopathy. Cardiovascular: Normal rate, regular rhythm. Grossly normal heart  sounds.  Good peripheral circulation.  Elevated blood pressure Respiratory: Normal respiratory effort.  No retractions. Lungs CTAB. Gastrointestinal: Soft and nontender. No distention. No abdominal bruits. No CVA tenderness. Musculoskeletal: No lower extremity tenderness nor edema.  No joint effusions. Neurologic:  Normal speech and language. No gross focal neurologic deficits are appreciated. No gait instability. Skin: Superficial scalp laceration.  Skin tear to left lower leg. Psychiatric: Mood and affect are normal. Speech and behavior are normal.  ____________________________________________   LABS (all labs ordered are listed, but only abnormal results are displayed)  Labs Reviewed - No data to display ____________________________________________  EKG   ____________________________________________  RADIOLOGY  ED MD interpretation:    Official radiology report(s): Ct Head Wo Contrast  Result Date: 02/06/2019 CLINICAL DATA:  Head injury after fall.  No loss of consciousness. EXAM: CT HEAD WITHOUT CONTRAST TECHNIQUE: Contiguous axial images were obtained from the base of the skull through the vertex without intravenous contrast. COMPARISON:  None. FINDINGS: Brain: Mild chronic ischemic white matter disease is noted. No mass effect or midline shift is noted. Ventricular size is within normal limits. There is no evidence of mass lesion, hemorrhage or acute infarction. Vascular: No hyperdense vessel or unexpected calcification. Skull: Normal. Negative for fracture or focal lesion. Sinuses/Orbits: No acute finding. Other: None. IMPRESSION: Mild chronic ischemic white matter disease. No acute intracranial abnormality seen. Electronically Signed   By: Marijo Conception M.D.   On: 02/06/2019 11:23    ____________________________________________   PROCEDURES  Procedure(s) performed (including Critical Care):  Marland KitchenMarland KitchenLaceration Repair  Date/Time: 02/06/2019 11:49 AM Performed by: Sable Feil, PA-C Authorized by: Sable Feil, PA-C   Consent:    Consent obtained:  Verbal   Consent given by:  Patient   Risks discussed:  Infection and pain Anesthesia (see MAR for exact dosages):    Anesthesia method:  None Laceration details:    Location:  Scalp   Scalp location:  Occipital   Length (cm):  1   Depth (mm):  2 Repair type:    Repair type:  Simple Pre-procedure details:    Preparation:  Patient was prepped and draped in usual sterile fashion Exploration:    Contaminated: no   Treatment:    Area cleansed with:  Betadine and saline   Amount of cleaning:  Standard Skin repair:    Repair method:  Staples   Number of staples:  2 Approximation:    Approximation:  Close Post-procedure details:    Dressing:  Open (no dressing)   Patient tolerance of procedure:  Tolerated well, no immediate complications .Marland KitchenLaceration Repair  Date/Time: 02/06/2019 11:50 AM Performed by: Sable Feil, PA-C Authorized by: Sable Feil, PA-C   Consent:    Consent obtained:  Verbal   Consent given by:  Patient   Risks discussed:  Infection, pain, poor cosmetic result and nerve damage Anesthesia (see MAR for exact dosages):    Anesthesia method:  None Laceration details:    Location:  Leg   Leg location:  L lower leg   Length (cm):  3   Depth (mm):  1 Repair type:    Repair type:  Simple Pre-procedure details:    Preparation:  Patient was prepped and draped in usual sterile fashion Exploration:    Contaminated: no   Treatment:    Area cleansed with:  Betadine and saline   Amount of cleaning:  Standard Skin repair:    Repair method:  Tissue adhesive Approximation:    Approximation:  Close Post-procedure details:    Dressing:  Sterile dressing   Patient tolerance of procedure:  Tolerated well, no immediate complications     ____________________________________________   INITIAL IMPRESSION / ASSESSMENT AND PLAN / ED COURSE  As part of my medical decision  making, I reviewed the following data within the [place] was evaluated in Emergency Department on 02/06/2019 for the symptoms described in the history of present illness. She was evaluated in the context of the global COVID-19 pandemic, which necessitated consideration that the patient might be at risk for infection with the SARS-CoV-2 virus that causes COVID-19. Institutional protocols and algorithms that pertain to the evaluation of patients at risk for COVID-19 are in a state of rapid change based on information released by regulatory bodies including the CDC and federal and state organizations. These policies and algorithms were followed during the patient's care in the ED.  Patient presents for minor head injury secondary to a trip and fall.  No LOC.  Patient seen a small laceration to the occipital area of the scalp.  Patient also sustained a laceration to the left lower leg.  Discussed negative CT findings with patient.  See procedure note for 2 wound closures.  Patient given discharge care instruction advised follow-up PCP in 10 days for staple removal.     ____________________________________________   FINAL CLINICAL IMPRESSION(S) / ED DIAGNOSES  Final diagnoses:  Laceration of scalp, initial encounter  Minor head injury, initial encounter  Laceration of left leg, initial encounter     ED Discharge Orders    None       Note:  This document was prepared using Dragon voice recognition software and may include unintentional dictation errors.    Sable Feil, PA-C 02/06/19 1152    Blake Divine, MD 02/06/19 1426

## 2019-02-06 NOTE — ED Notes (Signed)
Lac to back of head cleaned

## 2019-04-15 NOTE — Progress Notes (Signed)
Cardiology Office Note  Date:  04/16/2019   ID:  [name], DOB [birth year], MRN [id number]  PCP:  Adin Hector, MD   Chief Complaint  Patient presents with  . New Patient (Initial Visit)    Self ref for fluttering in chest for about 1 year but recently has gotten worse. Meds reviewed by the pt. verbally.     HPI:  [name] is a 86 year old woman with past medical history of hyperlipidemia, hypertension, type 2 diabetes Who presents for evaluation of her chest pain  She reports that for the past several weeks she has had a slight pressure in the left chest left pectoral region that seems to come on at rest  She is very active at baseline, does lots of stretching, sometimes very aggressive movement Does biking on a regular basis with no symptoms of chest pain or shortness of breath  At rest will feel a sensation in the left pectoral region like someone is generally massaging or pushing on her left pec  No recent cardiac studies Has been on Zocor for many years Reports blood pressure well controlled  Carotid ultrasound February 2015 No significant disease  Labs reviewed Total chol 168, LDL 81 HBA1C 6.4  EKG personally reviewed by myself on todays visit Shows normal sinus rhythm with rate 72 bpm no significant ST-T wave changes   PMH:   has a past medical history of Adenomatous polyps, Essential hypertension, benign, Insomnia, Menopausal syndrome, Other and unspecified hyperlipidemia, and Type II or unspecified type diabetes mellitus without mention of complication, not stated as uncontrolled.  PSH:    Past Surgical History:  Procedure Laterality Date  . MENISCUS REPAIR      Current Outpatient Medications  Medication Sig Dispense Refill  . aspirin 81 MG EC tablet Take 81 mg by mouth daily.      . Beclomethasone Dipropionate 80 MCG/ACT AERS Place into the nose as needed.     . irbesartan-hydrochlorothiazide (AVALIDE) 150-12.5 MG per tablet Take 1 tablet  by mouth daily.      . meloxicam (MOBIC) 7.5 MG tablet Take 7.5 mg by mouth daily.     . metformin (FORTAMET) 500 MG (OSM) 24 hr tablet Take 500 mg by mouth daily with breakfast.      . Multiple Vitamin (MULTI-VITAMIN) tablet Take 1 tablet by mouth daily.     Marland Kitchen PARoxetine (PAXIL-CR) 12.5 MG 24 hr tablet Take 12.5 mg by mouth every morning.      . simvastatin (ZOCOR) 40 MG tablet Take 40 mg by mouth at bedtime.       No current facility-administered medications for this visit.    Allergies:   Tetanus toxoids   Social History:  The patient  reports that she has never smoked. She has quit using smokeless tobacco. She reports current alcohol use of about 1.0 standard drinks of alcohol per week. She reports that she does not use drugs.   Family History:   family history includes Heart disease in her mother; Hyperlipidemia in her mother; Hypertension in her mother.    Review of Systems: Review of Systems  Constitutional: Negative.   HENT: Negative.   Respiratory: Negative.   Cardiovascular: Negative.        Occasional mild chest pressure on the left at rest  Gastrointestinal: Negative.   Musculoskeletal: Negative.   Neurological: Negative.   Psychiatric/Behavioral: Negative.   All other systems reviewed and are negative.   PHYSICAL EXAM: VS:  BP 138/68 (BP  Location: Right Arm, Patient Position: Sitting, Cuff Size: Normal)   Pulse 72   Ht 5\' 6"  (1.676 m)   Wt 128 lb 8 oz (58.3 kg)   BMI 20.74 kg/m  , BMI Body mass index is 20.74 kg/m. GEN: Well nourished, well developed, in no acute distress HEENT: normal Neck: no JVD, carotid bruits, or masses Cardiac: RRR; no murmurs, rubs, or gallops,no edema  Respiratory:  clear to auscultation bilaterally, normal work of breathing GI: soft, nontender, nondistended, + BS MS: no deformity or atrophy Skin: warm and dry, no rash Neuro:  Strength and sensation are intact Psych: euthymic mood, full affect    Recent Labs: No results found  for requested labs within last 8760 hours.    Lipid Panel No results found for: CHOL, HDL, LDLCALC, TRIG    Wt Readings from Last 3 Encounters:  04/16/19 128 lb 8 oz (58.3 kg)  02/06/19 126 lb (57.2 kg)  11/08/10 137 lb (62.1 kg)       ASSESSMENT AND PLAN:  Problem List Items Addressed This Visit      Cardiology Problems   HTN (hypertension)   Hyperlipidemia     Other   Controlled type 2 diabetes mellitus without complication, without long-term current use of insulin (HCC)   Chest discomfort - Primary     Left chest discomfort Atypical in nature, suspect musculoskeletal Does very aggressive stretching, biking Possibly pectoral muscle spasm For risk stratification we have ordered CT coronary calcium scoring She does not feel it is from palpitations or arrhythmia  Hypertension Well-controlled No changes to her medications  Type 2 diabetes without complications Hemoglobin A1c 6.4   Disposition:   F/U as needed   Total encounter time more than 45 minutes  Greater than 50% was spent in counseling and coordination of care with the patient    Signed, Esmond Plants, M.D., Ph.D. [place], [place]

## 2019-04-16 ENCOUNTER — Other Ambulatory Visit: Payer: Self-pay

## 2019-04-16 ENCOUNTER — Ambulatory Visit (INDEPENDENT_AMBULATORY_CARE_PROVIDER_SITE_OTHER): Payer: Medicare Other | Admitting: Cardiovascular Disease

## 2019-04-16 ENCOUNTER — Encounter: Payer: Self-pay | Admitting: Cardiovascular Disease

## 2019-04-16 VITALS — BP 138/68 | HR 72 | Ht 66.0 in | Wt 128.5 lb

## 2019-04-16 DIAGNOSIS — R0789 Other chest pain: Secondary | ICD-10-CM

## 2019-04-16 DIAGNOSIS — I10 Essential (primary) hypertension: Secondary | ICD-10-CM | POA: Insufficient documentation

## 2019-04-16 DIAGNOSIS — E1159 Type 2 diabetes mellitus with other circulatory complications: Secondary | ICD-10-CM

## 2019-04-16 DIAGNOSIS — E7800 Pure hypercholesterolemia, unspecified: Secondary | ICD-10-CM | POA: Insufficient documentation

## 2019-04-16 DIAGNOSIS — E782 Mixed hyperlipidemia: Secondary | ICD-10-CM | POA: Diagnosis not present

## 2019-04-16 DIAGNOSIS — R079 Chest pain, unspecified: Secondary | ICD-10-CM

## 2019-04-16 NOTE — Patient Instructions (Addendum)
CT CORONARY CALCIUM SCORE For chest pain  We will order CT coronary calcium score $150   Please call [phone] to schedule     [facility]  [address] [place], [place] [address]   Medication Instructions:  No changes  If you need a refill on your cardiac medications before your next appointment, please call your pharmacy.    Lab work: No new labs needed   If you have labs (blood work) drawn today and your tests are completely normal, you will receive your results only by: Marland Kitchen MyChart Message (if you have MyChart) OR . A paper copy in the mail If you have any lab test that is abnormal or we need to change your treatment, we will call you to review the results.   Testing/Procedures: No new testing needed   Follow-Up: At [facility], you and your health needs are our priority.  As part of our continuing mission to provide you with exceptional heart care, we have created designated Provider Care Teams.  These Care Teams include your primary Cardiologist (physician) and Advanced Practice Providers (APPs -  Physician Assistants and Nurse Practitioners) who all work together to provide you with the care you need, when you need it.  . You will need a follow up appointment as needed  . Providers on your designated Care Team:   . Murray Hodgkins, NP . Christell Faith, PA-C . Marrianne Mood, PA-C  Any Other Special Instructions Will Be Listed Below (If Applicable).  For educational health videos Log in to : [email or web address] Or : [email or web address], password : [email or web address]

## 2019-05-10 ENCOUNTER — Ambulatory Visit (INDEPENDENT_AMBULATORY_CARE_PROVIDER_SITE_OTHER)
Admission: RE | Admit: 2019-05-10 | Discharge: 2019-05-10 | Disposition: A | Discharge status: Home / Self Care | Payer: Self-pay | Source: Ambulatory Visit | Attending: Cardiovascular Disease | Admitting: Cardiovascular Disease

## 2019-05-10 ENCOUNTER — Telehealth: Payer: Self-pay

## 2019-05-10 ENCOUNTER — Other Ambulatory Visit: Payer: Self-pay

## 2019-05-10 DIAGNOSIS — R079 Chest pain, unspecified: Secondary | ICD-10-CM

## 2019-05-10 NOTE — Telephone Encounter (Signed)
Per Opal Sidles from [facility], she would like to bring recommendations from the impression found on the final results of patient's CT cardiac calcium score done today to physician's attention.  IMPRESSION: 1. Evidence for chronic lung changes with extensive peripheral and pleural based densities. Findings are likely related to fibrotic changes. Focal area of pleural / fissural thickening along the anterior right minor fissure measuring up to 1.4 cm. In addition, there is a prominent prevascular lymph node. Recommend dedicated chest CT with IV contrast for complete evaluation of the lungs and complete evaluation of the mediastinum to exclude lymphadenopathy. 2.  Aortic Atherosclerosis (ICD10-I70.0).

## 2019-05-11 NOTE — Telephone Encounter (Signed)
Can we call primary care, [name] and see if he would like [place] to order chest CT scan or if he would like to do this My impression it would be CT scan chest no contrast unless he feels contrast is needed We are still waiting for coronary calcium scoring  Can we also let the patient know there was nonspecific lung findings, further imaging may be needed

## 2019-05-13 NOTE — Telephone Encounter (Signed)
Spoke with patient and reviewed preliminary findings with recommendations for repeat test and that it was sent to Dr. Ramond Craver office for them to schedule. Requested that if she did not hear from them to please call us back. She verbalized understanding with no further questions at this time.

## 2019-05-13 NOTE — Telephone Encounter (Signed)
Note routed to pts PCP and will call later to see if they received.

## 2019-05-14 ENCOUNTER — Telehealth: Payer: Self-pay | Admitting: Cardiovascular Disease

## 2019-05-14 NOTE — Telephone Encounter (Signed)
Please call with CT results.

## 2019-05-14 NOTE — Telephone Encounter (Signed)
Spoke with patient and she wanted to review results again. Reviewed those findings with her and that it is still pending cardiac scoring. Advised that I did send Dr. Ramonita [facility] message regarding the other testing which was suggested by the radiologist. Let her know that I would also call his office to follow up on this. Called their office and spoke with Lattie Haw. She put in note for nurse to review and call back.   Call primary care, Ramonita Lab and see if he would like [place] to order chest CT scan or if he would like to do this My impression it would be CT scan chest no contrast unless he feels contrast is needed

## 2019-05-15 ENCOUNTER — Other Ambulatory Visit: Payer: Self-pay | Admitting: Internal Medicine

## 2019-05-15 DIAGNOSIS — J849 Interstitial pulmonary disease, unspecified: Secondary | ICD-10-CM

## 2019-05-21 ENCOUNTER — Other Ambulatory Visit: Payer: Self-pay

## 2019-05-21 ENCOUNTER — Telehealth: Payer: Self-pay | Admitting: *Deleted

## 2019-05-21 ENCOUNTER — Ambulatory Visit
Admission: RE | Admit: 2019-05-21 | Discharge: 2019-05-21 | Disposition: A | Discharge status: Home / Self Care | Payer: Medicare Other | Source: Ambulatory Visit | Attending: Internal Medicine | Admitting: Internal Medicine

## 2019-05-21 DIAGNOSIS — J849 Interstitial pulmonary disease, unspecified: Secondary | ICD-10-CM | POA: Diagnosis present

## 2019-05-21 LAB — POCT I-STAT CREATININE: Creatinine, Ser: 1 mg/dL (ref 0.44–1.00)

## 2019-05-21 MED ORDER — IOHEXOL 300 MG/ML  SOLN
75.0000 mL | Freq: Once | INTRAMUSCULAR | Status: AC | PRN
  Administered 2019-05-21: 13:00:00 75 mL via INTRAVENOUS

## 2019-05-21 MED ORDER — EZETIMIBE 10 MG PO TABS: 10.0000 mg | ORAL_TABLET | Freq: Every day | ORAL | 3 refills | Status: DC

## 2019-05-21 NOTE — Telephone Encounter (Signed)
-----   Message from Minna Merritts, MD sent at 05/19/2019  7:49 PM EST ----- Mildly elevated coronary calcium score Given Cholesterol mildly elevated, Would add zetia 10 mg daily Can we confirm PMD has ordered a chest CTA for lung findings? Or do we need to order

## 2019-05-21 NOTE — Telephone Encounter (Signed)
Spoke with patients spouse because she was taking a nap at this time. Reviewed results and recommendations and they have appointment today for the CT scan. Reviewed medication and he did verify pharmacy to send this in. He had no further questions at this time and let him know to call back if any further concerns.

## 2019-05-23 DIAGNOSIS — J849 Interstitial pulmonary disease, unspecified: Secondary | ICD-10-CM | POA: Insufficient documentation

## 2019-05-23 DIAGNOSIS — I700 Atherosclerosis of aorta: Secondary | ICD-10-CM | POA: Insufficient documentation

## 2019-06-04 ENCOUNTER — Ambulatory Visit: Payer: Medicare Other

## 2019-12-23 ENCOUNTER — Other Ambulatory Visit: Payer: Self-pay

## 2019-12-23 ENCOUNTER — Ambulatory Visit (INDEPENDENT_AMBULATORY_CARE_PROVIDER_SITE_OTHER): Payer: Medicare Other | Admitting: Dermatology

## 2019-12-23 ENCOUNTER — Encounter: Payer: Self-pay | Admitting: Dermatology

## 2019-12-23 DIAGNOSIS — L820 Inflamed seborrheic keratosis: Secondary | ICD-10-CM

## 2019-12-23 DIAGNOSIS — Z1283 Encounter for screening for malignant neoplasm of skin: Secondary | ICD-10-CM | POA: Diagnosis not present

## 2019-12-23 DIAGNOSIS — L821 Other seborrheic keratosis: Secondary | ICD-10-CM | POA: Diagnosis not present

## 2019-12-23 DIAGNOSIS — D692 Other nonthrombocytopenic purpura: Secondary | ICD-10-CM

## 2019-12-23 DIAGNOSIS — D1800 Hemangioma unspecified site: Secondary | ICD-10-CM

## 2019-12-23 DIAGNOSIS — L578 Other skin changes due to chronic exposure to nonionizing radiation: Secondary | ICD-10-CM

## 2019-12-23 DIAGNOSIS — L817 Pigmented purpuric dermatosis: Secondary | ICD-10-CM

## 2019-12-23 DIAGNOSIS — L814 Other melanin hyperpigmentation: Secondary | ICD-10-CM

## 2019-12-23 DIAGNOSIS — D2239 Melanocytic nevi of other parts of face: Secondary | ICD-10-CM

## 2019-12-23 NOTE — Patient Instructions (Addendum)

## 2019-12-23 NOTE — Progress Notes (Signed)
Follow-Up Visit   Subjective  [name] is a 86 y.o. female who presents for the following: TBSE (Itchy area under left chin). Patient presents today for Patient here for full body skin exam and skin cancer screening. Patient has no h/o skin cancer The patient presents for Total-Body Skin Exam (TBSE) for skin cancer screening and mole check.  The following portions of the chart were reviewed this encounter and updated as appropriate:  Tobacco  Allergies  Meds  Problems  Med Hx  Surg Hx  Fam Hx     Review of Systems:  No other skin or systemic complaints except as noted in HPI or Assessment and Plan.  Objective  Well appearing patient in no apparent distress; mood and affect are within normal limits.  A full examination was performed including scalp, head, eyes, ears, nose, lips, neck, chest, axillae, abdomen, back, buttocks, bilateral upper extremities, bilateral lower extremities, hands, feet, fingers, toes, fingernails, and toenails. All findings within normal limits unless otherwise noted below.  Objective  Left Forearm, Neck - Posterior: Erythematous keratotic or waxy stuck-on papule or plaque.   Objective  B/L lower leg: Violaceous macules and patches. Stasis changes   Assessment & Plan  Inflamed seborrheic keratosis (2) Neck - Posterior; Left Forearm  Cryotherapy today Prior to procedure, discussed risks of blister formation, small wound, skin dyspigmentation, or rare scar following cryotherapy.    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  Observation.  Call clinic for new or changing lesions.  Recommend daily use of broad spectrum spf 30+ sunscreen to sun-exposed areas.   Lentigines - Scattered tan macules - Discussed due to sun exposure - Benign, observe - Call for any changes  Seborrheic Keratoses - Stuck-on, waxy, tan-brown papules and plaques  - Discussed benign etiology and prognosis. - Observe - Call for any changes  Melanocytic Nevi - one of the left chin/mandible with some itch, but appears benign - Tan-brown and/or pink-flesh-colored symmetric macules and papules - Benign appearing on exam today - Observation - Call clinic for new or changing moles - Recommend daily use of broad spectrum spf 30+ sunscreen to sun-exposed areas.   Hemangiomas - Red papules - Discussed benign nature - Observe - Call for any changes  Actinic Damage - diffuse scaly erythematous macules with underlying dyspigmentation - Recommend daily broad spectrum sunscreen SPF 30+ to sun-exposed areas, reapply every 2 hours as needed.  - Call for new or changing lesions.  Purpura - Violaceous macules and patches - Benign - Related to age, sun damage and/or use of blood thinners - Observe - Can use OTC arnica containing moisturizer such as Dermend Bruise Formula if desired - Call for worsening or other concerns  Skin cancer screening performed today.  Return in about 1 year (around 12/22/2020) for TBSE.  I, Donzetta Kohut, CMA, am acting as scribe for Sarina Ser, MD . Documentation: I have reviewed the above documentation for accuracy and completeness, and I agree with the above.  Sarina Ser, MD

## 2020-02-12 ENCOUNTER — Other Ambulatory Visit: Payer: Self-pay | Admitting: Cardiovascular Disease

## 2020-06-01 ENCOUNTER — Other Ambulatory Visit: Payer: Self-pay | Admitting: Cardiovascular Disease

## 2020-06-01 NOTE — Telephone Encounter (Signed)
Wife states she will call back and hung up

## 2020-06-01 NOTE — Telephone Encounter (Signed)
Patient is overdue for 12 month follow up. Please call to schedule for further refills on medication.

## 2020-06-04 ENCOUNTER — Other Ambulatory Visit: Payer: Self-pay | Admitting: Cardiovascular Disease

## 2020-06-04 NOTE — Telephone Encounter (Signed)
Will call back.

## 2020-06-04 NOTE — Telephone Encounter (Signed)
LVM on home phone and spoke to husband on cell who will have her callback

## 2020-06-04 NOTE — Telephone Encounter (Signed)
Please schedule office visit for refills or patient to get refill from PCP if they do not want to schedule. Thank you!

## 2020-06-05 ENCOUNTER — Other Ambulatory Visit: Payer: Self-pay | Admitting: Cardiovascular Disease

## 2020-06-05 NOTE — Telephone Encounter (Signed)
Scheduled in april

## 2020-07-07 NOTE — Progress Notes (Signed)
Cardiology Office Note  Date:  07/08/2020   ID:  PREET MANGANO, DOB 11/14/1932, MRN [id number]  PCP:  Adin Hector, MD   Chief Complaint  Patient presents with  . 12 month follow up    "doing well." Medications reviewed by the patient verbally.     HPI:  [name] is a 87 year old woman with past medical history of hyperlipidemia,  hypertension,  type 2 diabetes Who presents for evaluation of her chest pain  Last seen in clinic January 2021  CT coronary calcium scoring February 2021 Score of 256 Reviewed with her in detail  Abdominal CT February 2021, reviewed Concern for early interstitial lung disease, aortic atherosclerosis  Right knee pain, cortisone, pain better Denies shortness of breath concerning for lung disease Denies any chest pain on exertion  Lab work reviewed in detail Total chol 147, A1C 6.3  EKG personally reviewed by myself on todays visit Shows normal sinus rhythm rate 69 bpm no significant ST or T wave changes  Other past medical history reviewed Carotid ultrasound February 2015 No significant disease   PMH:   has a past medical history of Adenomatous polyps, Essential hypertension, benign, Insomnia, Menopausal syndrome, Other and unspecified hyperlipidemia, and Type II or unspecified type diabetes mellitus without mention of complication, not stated as uncontrolled.  PSH:    Past Surgical History:  Procedure Laterality Date  . MENISCUS REPAIR      Current Outpatient Medications  Medication Sig Dispense Refill  . aspirin 81 MG EC tablet Take 81 mg by mouth daily.    . Beclomethasone Dipropionate 80 MCG/ACT AERS Place into the nose as needed.     . benazepril (LOTENSIN) 20 MG tablet Take by mouth.    . ezetimibe (ZETIA) 10 MG tablet Take 1 tablet (10 mg total) by mouth daily. PLEASE CALL OFFICE TO SCHEDULE AN APPOINTMENT 30 tablet 0  . irbesartan-hydrochlorothiazide (AVALIDE) 150-12.5 MG per tablet Take 1 tablet by mouth daily.     . metformin (FORTAMET) 500 MG (OSM) 24 hr tablet Take 500 mg by mouth daily with breakfast.    . Multiple Vitamin (MULTI-VITAMIN) tablet Take 1 tablet by mouth daily.     Marland Kitchen PARoxetine (PAXIL-CR) 12.5 MG 24 hr tablet Take 12.5 mg by mouth every morning.    . simvastatin (ZOCOR) 40 MG tablet Take 40 mg by mouth at bedtime.     No current facility-administered medications for this visit.    Allergies:   Tetanus toxoids   Social History:  The patient  reports that she has never smoked. She has quit using smokeless tobacco. She reports current alcohol use of about 1.0 standard drink of alcohol per week. She reports that she does not use drugs.   Family History:   family history includes Heart disease in her mother; Hyperlipidemia in her mother; Hypertension in her mother.    Review of Systems: Review of Systems  Constitutional: Negative.   HENT: Negative.   Respiratory: Negative.   Cardiovascular: Negative.        Occasional mild chest pressure on the left at rest  Gastrointestinal: Negative.   Musculoskeletal: Negative.   Neurological: Negative.   Psychiatric/Behavioral: Negative.   All other systems reviewed and are negative.   PHYSICAL EXAM: VS:  BP 130/60 (BP Location: Left Arm, Patient Position: Sitting, Cuff Size: Normal)   Pulse 69   Ht 5\' 5"  (4.193 m)   Wt 127 lb (57.6 kg)   BMI 21.13 kg/m  , BMI  Body mass index is 21.13 kg/m. GEN: Well nourished, well developed, in no acute distress HEENT: normal Neck: no JVD, carotid bruits, or masses Cardiac: RRR; no murmurs, rubs, or gallops,no edema  Respiratory:  clear to auscultation bilaterally, normal work of breathing GI: soft, nontender, nondistended, + BS MS: no deformity or atrophy Skin: warm and dry, no rash Neuro:  Strength and sensation are intact Psych: euthymic mood, full affect    Recent Labs: No results found for requested labs within last 8760 hours.    Lipid Panel No results found for: CHOL, HDL,  LDLCALC, TRIG    Wt Readings from Last 3 Encounters:  07/08/20 127 lb (57.6 kg)  04/16/19 128 lb 8 oz (58.3 kg)  02/06/19 126 lb (57.2 kg)       ASSESSMENT AND PLAN:  Problem List Items Addressed This Visit      Cardiology Problems   Hyperlipidemia     Other   Chest discomfort - Primary   Relevant Orders   EKG 12-Lead    Other Visit Diagnoses    Controlled type 2 diabetes mellitus with other circulatory complication, without long-term current use of insulin (HCC)       Relevant Orders   EKG 12-Lead   Essential hypertension       Relevant Orders   EKG 12-Lead     Left chest discomfort No recent symptoms, previously felt to be atypical, musculoskeletal Low calcium scoring No further work-up at this time  Hypertension Blood pressure is well controlled on today's visit. No changes made to the medications.  Type 2 diabetes without complications O2D well controlled  Hyperlipidemia Cholesterol is at goal on the current lipid regimen. No changes to the medications were made.    Total encounter time more than 25 minutes  Greater than 50% was spent in counseling and coordination of care with the patient    Signed, Esmond Plants, M.D., Ph.D. [place], [place]

## 2020-07-08 ENCOUNTER — Encounter: Payer: Self-pay | Admitting: Cardiovascular Disease

## 2020-07-08 ENCOUNTER — Ambulatory Visit (INDEPENDENT_AMBULATORY_CARE_PROVIDER_SITE_OTHER): Payer: Medicare Other | Admitting: Cardiovascular Disease

## 2020-07-08 ENCOUNTER — Other Ambulatory Visit: Payer: Self-pay

## 2020-07-08 VITALS — BP 130/60 | HR 69 | Ht 65.0 in | Wt 127.0 lb

## 2020-07-08 DIAGNOSIS — E1159 Type 2 diabetes mellitus with other circulatory complications: Secondary | ICD-10-CM

## 2020-07-08 DIAGNOSIS — E782 Mixed hyperlipidemia: Secondary | ICD-10-CM | POA: Diagnosis not present

## 2020-07-08 DIAGNOSIS — R0789 Other chest pain: Secondary | ICD-10-CM

## 2020-07-08 DIAGNOSIS — I10 Essential (primary) hypertension: Secondary | ICD-10-CM | POA: Diagnosis not present

## 2020-07-08 NOTE — Patient Instructions (Signed)
Medication Instructions:  No changes  If you need a refill on your cardiac medications before your next appointment, please call your pharmacy.    Lab work: No new labs needed   If you have labs (blood work) drawn today and your tests are completely normal, you will receive your results only by: . MyChart Message (if you have MyChart) OR . A paper copy in the mail If you have any lab test that is abnormal or we need to change your treatment, we will call you to review the results.   Testing/Procedures: No new testing needed   Follow-Up: At [facility], you and your health needs are our priority.  As part of our continuing mission to provide you with exceptional heart care, we have created designated Provider Care Teams.  These Care Teams include your primary Cardiologist (physician) and Advanced Practice Providers (APPs -  Physician Assistants and Nurse Practitioners) who all work together to provide you with the care you need, when you need it.  . You will need a follow up appointment in 12 months  . Providers on your designated Care Team:   . Christopher Berge, NP . Ryan Dunn, PA-C . Jacquelyn Visser, PA-C  Any Other Special Instructions Will Be Listed Below (If Applicable).  COVID-19 Vaccine Information can be found at: [email or web address] For questions related to vaccine distribution or appointments, please email [email or web address] or call [phone].     

## 2020-09-28 ENCOUNTER — Other Ambulatory Visit: Payer: Self-pay | Admitting: Cardiovascular Disease

## 2020-12-30 ENCOUNTER — Ambulatory Visit (INDEPENDENT_AMBULATORY_CARE_PROVIDER_SITE_OTHER): Payer: Medicare Other | Admitting: Dermatology

## 2020-12-30 ENCOUNTER — Other Ambulatory Visit: Payer: Self-pay

## 2020-12-30 DIAGNOSIS — D692 Other nonthrombocytopenic purpura: Secondary | ICD-10-CM

## 2020-12-30 DIAGNOSIS — L578 Other skin changes due to chronic exposure to nonionizing radiation: Secondary | ICD-10-CM | POA: Diagnosis not present

## 2020-12-30 DIAGNOSIS — L814 Other melanin hyperpigmentation: Secondary | ICD-10-CM | POA: Diagnosis not present

## 2020-12-30 DIAGNOSIS — Z1283 Encounter for screening for malignant neoplasm of skin: Secondary | ICD-10-CM

## 2020-12-30 DIAGNOSIS — L570 Actinic keratosis: Secondary | ICD-10-CM

## 2020-12-30 DIAGNOSIS — L821 Other seborrheic keratosis: Secondary | ICD-10-CM | POA: Diagnosis not present

## 2020-12-30 DIAGNOSIS — D229 Melanocytic nevi, unspecified: Secondary | ICD-10-CM

## 2020-12-30 DIAGNOSIS — L820 Inflamed seborrheic keratosis: Secondary | ICD-10-CM

## 2020-12-30 NOTE — Patient Instructions (Signed)

## 2020-12-30 NOTE — Progress Notes (Signed)
Follow-Up Visit   Subjective  [name] is a 87 y.o. female who presents for the following: Annual Exam (Patient has an itchy irritated lesion on her L neck and a lesion on her R wrist that gets caught on her jewelry. ). The patient presents for Total-Body Skin Exam (TBSE) for skin cancer screening and mole check.  The following portions of the chart were reviewed this encounter and updated as appropriate:   Tobacco  Allergies  Meds  Problems  Med Hx  Surg Hx  Fam Hx     Review of Systems:  No other skin or systemic complaints except as noted in HPI or Assessment and Plan.  Objective  Well appearing patient in no apparent distress; mood and affect are within normal limits.  A full examination was performed including scalp, head, eyes, ears, nose, lips, neck, chest, axillae, abdomen, back, buttocks, bilateral upper extremities, bilateral lower extremities, hands, feet, fingers, toes, fingernails, and toenails. All findings within normal limits unless otherwise noted below.  L ant neck x 1, R wrist x 1, R neck x 1 (3) Erythematous keratotic or waxy stuck-on papule or plaque.   L nose x 1 Erythematous thin papules/macules with gritty scale.    Assessment & Plan  Inflamed seborrheic keratosis L ant neck x 1, R wrist x 1, R neck x 1  Destruction of lesion - L ant neck x 1, R wrist x 1, R neck x 1 Complexity: simple   Destruction method: cryotherapy   Informed consent: discussed and consent obtained   Timeout:  patient name, date of birth, surgical site, and procedure verified Lesion destroyed using liquid nitrogen: Yes   Region frozen until ice ball extended beyond lesion: Yes   Outcome: patient tolerated procedure well with no complications   Post-procedure details: wound care instructions given    AK (actinic keratosis) L nose x 1  Destruction of lesion - L nose x 1 Complexity: simple   Destruction method: cryotherapy   Informed consent: discussed and consent  obtained   Timeout:  patient name, date of birth, surgical site, and procedure verified Lesion destroyed using liquid nitrogen: Yes   Region frozen until ice ball extended beyond lesion: Yes   Outcome: patient tolerated procedure well with no complications   Post-procedure details: wound care instructions given    Seborrheic Keratoses - Stuck-on, waxy, tan-brown papules and/or plaques  - Benign-appearing - Discussed benign etiology and prognosis. - Observe - Call for any changes  Lentigines - Scattered tan macules - Due to sun exposure - Benign-appearing, observe - Recommend daily broad spectrum sunscreen SPF 30+ to sun-exposed areas, reapply every 2 hours as needed. - Call for any changes  Seborrheic Keratoses - Stuck-on, waxy, tan-brown papules and/or plaques  - Benign-appearing - Discussed benign etiology and prognosis. - Observe - Call for any changes  Melanocytic Nevi - Tan-brown and/or pink-flesh-colored symmetric macules and papules - Benign appearing on exam today - Observation - Call clinic for new or changing moles - Recommend daily use of broad spectrum spf 30+ sunscreen to sun-exposed areas.   Hemangiomas - Red papules - Discussed benign nature - Observe - Call for any changes  Actinic Damage - Chronic condition, secondary to cumulative UV/sun exposure - diffuse scaly erythematous macules with underlying dyspigmentation - Recommend daily broad spectrum sunscreen SPF 30+ to sun-exposed areas, reapply every 2 hours as needed.  - Staying in the shade or wearing long sleeves, sun glasses (UVA+UVB protection) and wide brim hats (  4-inch brim around the entire circumference of the hat) are also recommended for sun protection.  - Call for new or changing lesions.  Purpura - Chronic; persistent and recurrent.  Treatable, but not curable. - Violaceous macules and patches - Benign - Related to trauma, age, sun damage and/or use of blood thinners, chronic use of  topical and/or oral steroids - Observe - Can use OTC arnica containing moisturizer such as Dermend Bruise Formula if desired - Call for worsening or other concerns  Skin cancer screening performed today.  Return in about 1 year (around 12/30/2021) for TBSE.  Luther Redo, CMA, am acting as scribe for Sarina Ser, MD . Documentation: I have reviewed the above documentation for accuracy and completeness, and I agree with the above.  Sarina Ser, MD

## 2021-01-05 ENCOUNTER — Encounter: Payer: Self-pay | Admitting: Dermatology

## 2021-02-16 ENCOUNTER — Other Ambulatory Visit: Payer: Self-pay | Admitting: Internal Medicine

## 2021-02-16 DIAGNOSIS — J849 Interstitial pulmonary disease, unspecified: Secondary | ICD-10-CM

## 2021-02-16 DIAGNOSIS — I10 Essential (primary) hypertension: Secondary | ICD-10-CM

## 2021-03-16 ENCOUNTER — Ambulatory Visit: Payer: Medicare Other | Attending: Internal Medicine

## 2021-06-22 IMAGING — CT CT HEAD W/O CM
3 series · 16 of 47 positions shown, 19 images · non-contrast
Comparison: None.

CLINICAL DATA: Head injury after fall.  No loss of consciousness.

EXAM:
CT HEAD WITHOUT CONTRAST
TECHNIQUE: Contiguous axial images were obtained from the base of the skull
through the vertex without intravenous contrast.

[Series 2: head wo · axial · 0.43mm/px · z∈[+159,+284]mm · 10 of 30 slices shown, 13 images]
[im 3/30  brain]
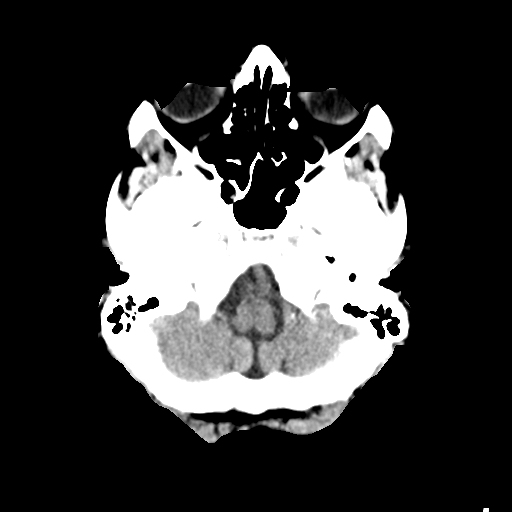
[im 3/30  bone]
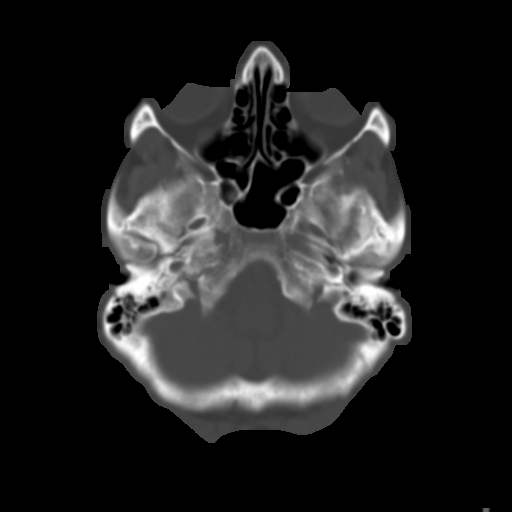
[im 6/30  brain]
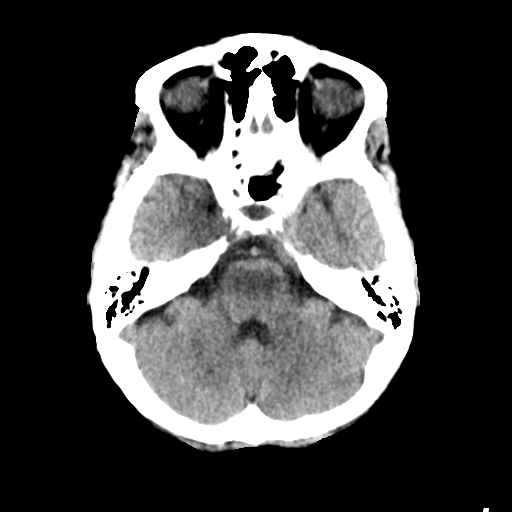
[im 9/30  brain]
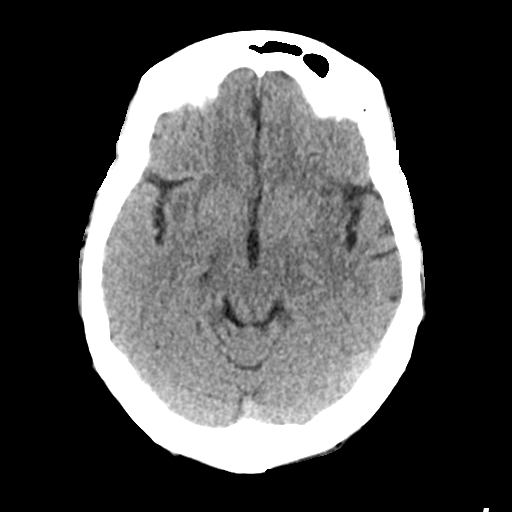
[im 11/30  brain]
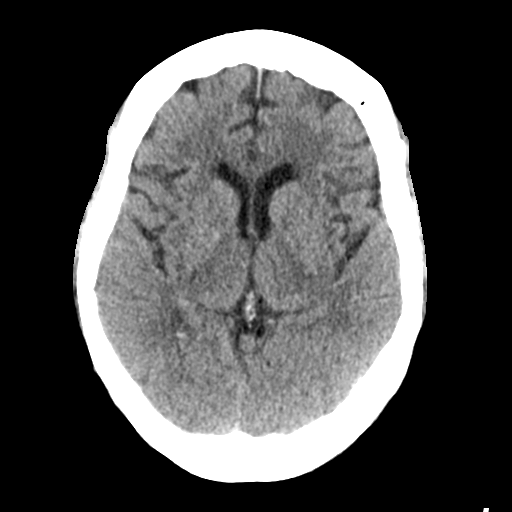
[im 14/30  brain]
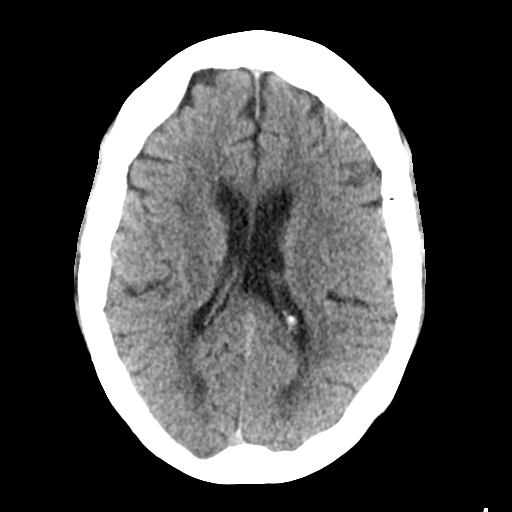
[im 14/30  bone]
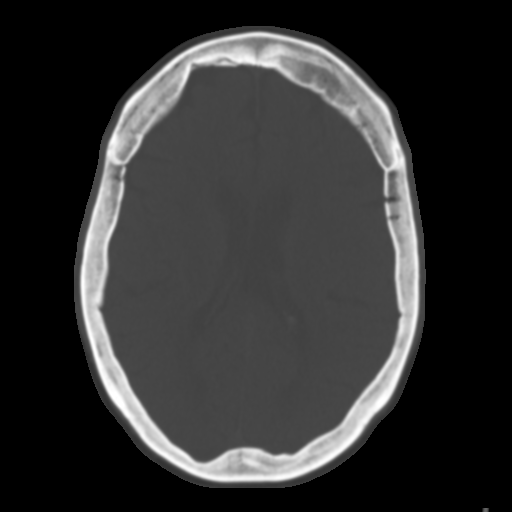
[im 17/30  brain]
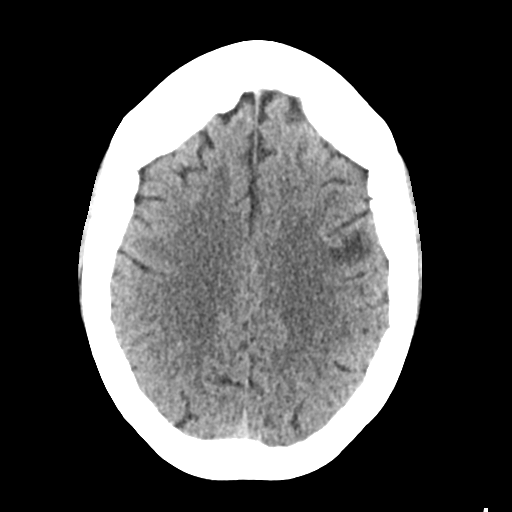
[im 20/30  brain]
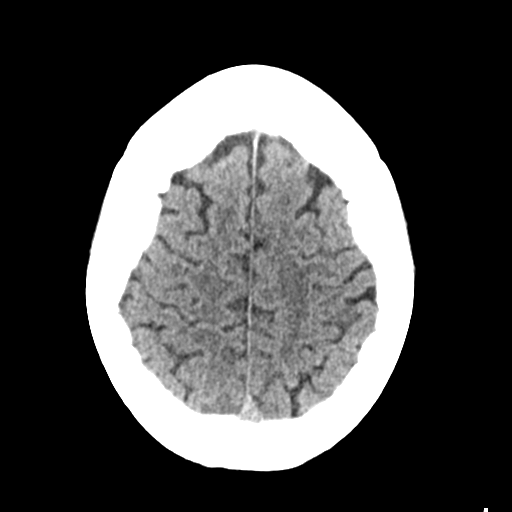
[im 23/30  brain]
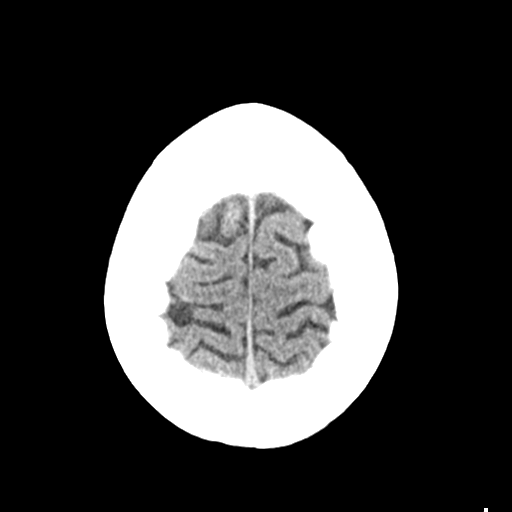
[im 25/30  brain]
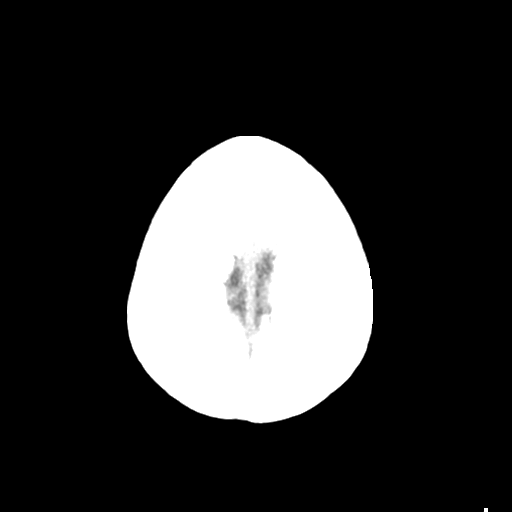
[im 25/30  bone]
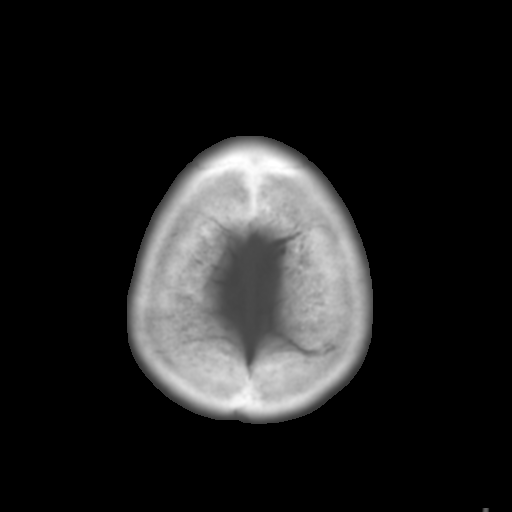
[im 28/30  brain]
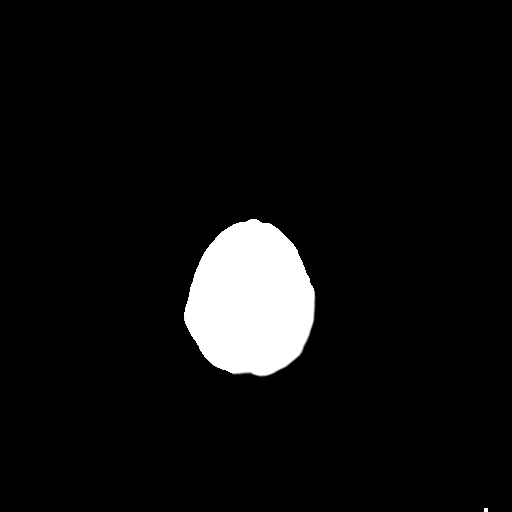

[Series 4: coronal soft tissue · coronal · 0.30mm/px · 3 of 68 slices shown]
[im 23/68  brain]
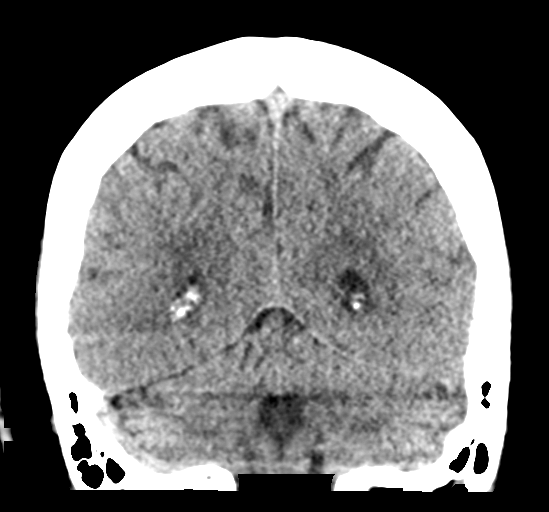
[im 30/68  brain]
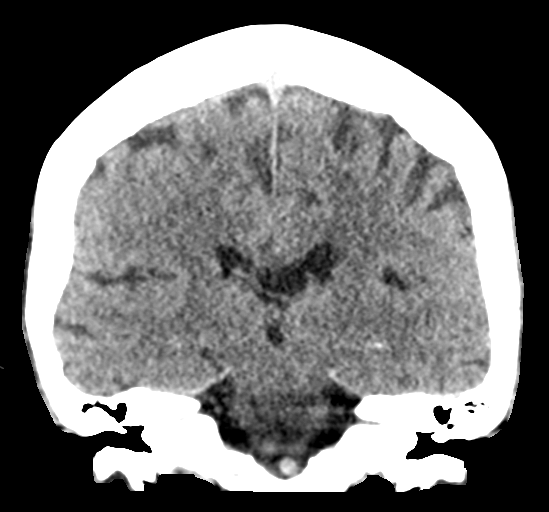
[im 38/68  brain]
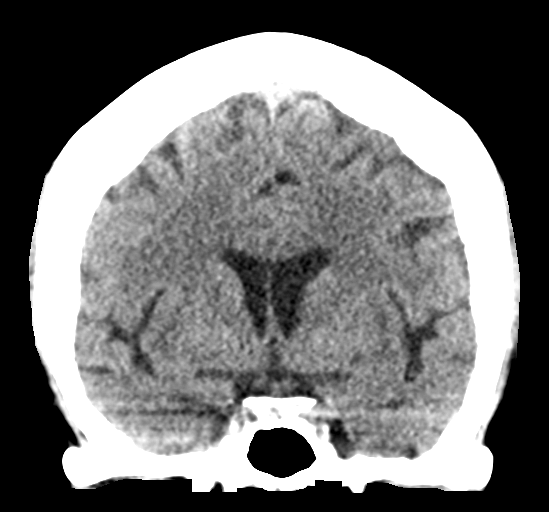

[Series 5: sagittal soft tissue · sagittal · 0.30mm/px · 3 of 56 slices shown]
[im 19/56  brain]
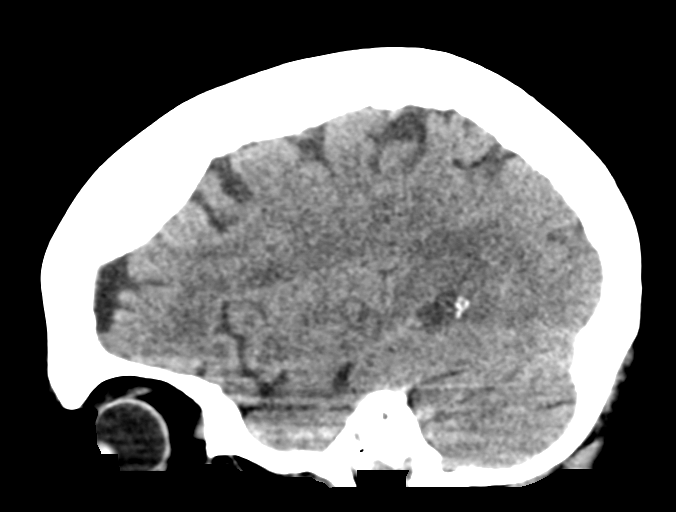
[im 28/56  brain]
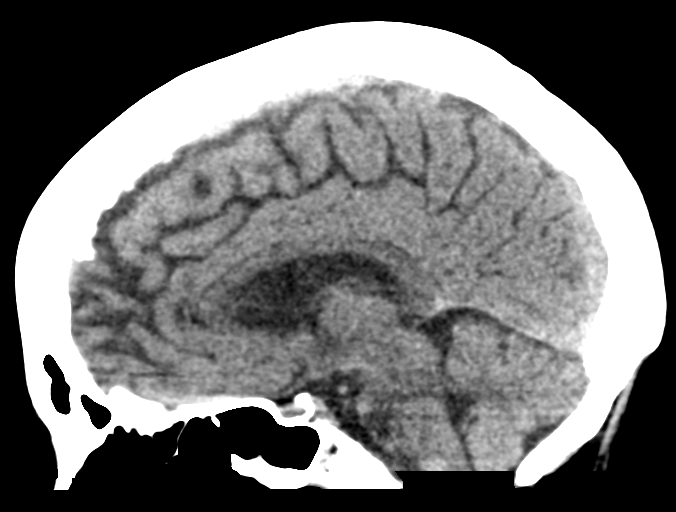
[im 37/56  brain]
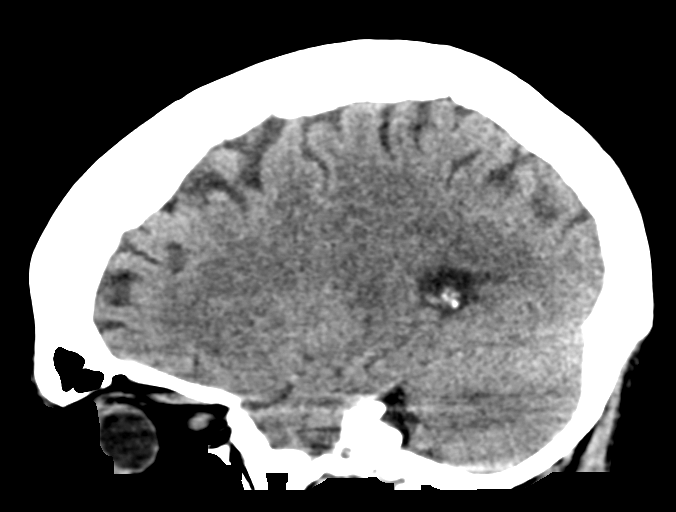

[16 of 47 positions shown; findings below may reference images not displayed]

FINDINGS: Brain: Mild chronic ischemic white matter disease is noted. No mass
effect or midline shift is noted. Ventricular size is within normal
limits. There is no evidence of mass lesion, hemorrhage or acute
infarction.

Vascular: No hyperdense vessel or unexpected calcification.

Skull: Normal. Negative for fracture or focal lesion.

Sinuses/Orbits: No acute finding.

Other: None.
IMPRESSION: Mild chronic ischemic white matter disease. No acute intracranial
abnormality seen.

## 2021-07-15 ENCOUNTER — Other Ambulatory Visit: Payer: Self-pay | Admitting: Cardiovascular Disease

## 2021-07-15 NOTE — Telephone Encounter (Signed)
Attempted to schedule.  LMOV to call office.  ° °

## 2021-07-15 NOTE — Telephone Encounter (Signed)
Please contact pt for future appointment. ?Pt due for 12 month f/u. ?Pt needing refills. ?

## 2021-07-20 NOTE — Telephone Encounter (Signed)
Attempted to schedule.  

## 2021-08-19 ENCOUNTER — Other Ambulatory Visit: Payer: Self-pay | Admitting: Cardiovascular Disease

## 2021-09-03 ENCOUNTER — Ambulatory Visit: Payer: Medicare Other | Admitting: Cardiovascular Disease

## 2021-09-20 ENCOUNTER — Other Ambulatory Visit: Payer: Self-pay | Admitting: Cardiovascular Disease

## 2021-09-20 ENCOUNTER — Other Ambulatory Visit: Payer: Self-pay | Admitting: *Deleted

## 2021-09-20 MED ORDER — EZETIMIBE 10 MG PO TABS: ORAL_TABLET | ORAL | 0 refills | Status: DC

## 2021-10-04 IMAGING — CT CT CHEST W/ CM
2 of 4 series · 15 of 36 positions shown, 18 images · IV contrast (omnipaque)
Comparison: 05/10/2019

CLINICAL DATA: Follow-up abnormal cardiac CT.

EXAM:
CT CHEST WITH CONTRAST
TECHNIQUE: Multidetector CT imaging of the chest was performed during
intravenous contrast administration.
CONTRAST:  75mL OMNIPAQUE IOHEXOL 300 MG/ML  SOLN

[Series 2: axial chest 2.00 · axial · 0.64mm/px · z∈[-1191,-911]mm · 12 of 166 slices shown, 15 images]
[im 13/166  mediastinal]
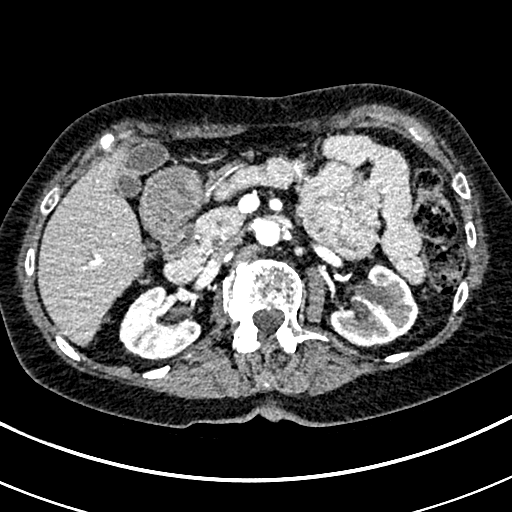
[im 13/166  lung]
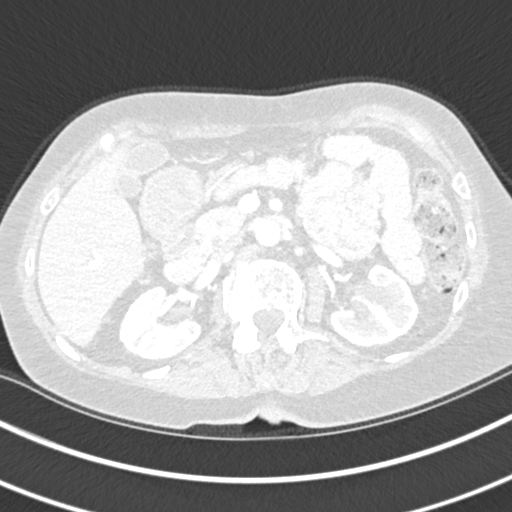
[im 26/166  lung]
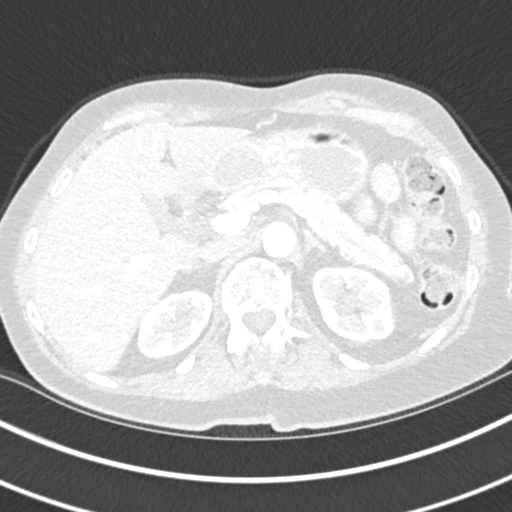
[im 39/166  lung]
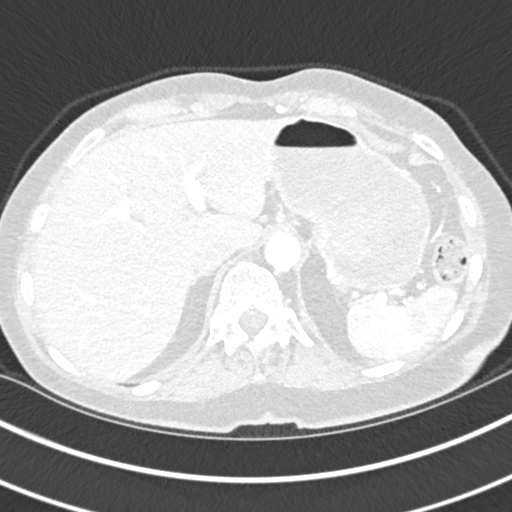
[im 51/166  lung]
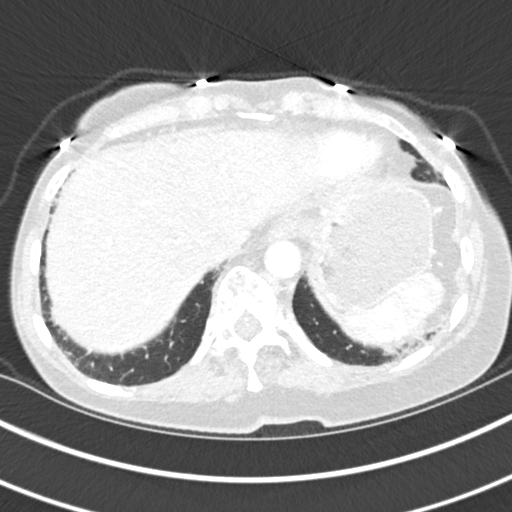
[im 64/166  mediastinal]
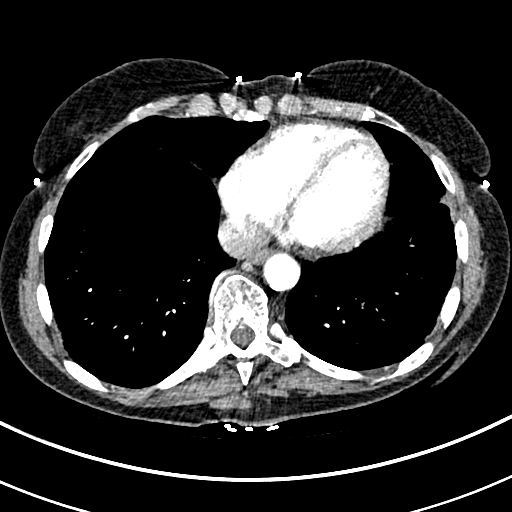
[im 64/166  lung]
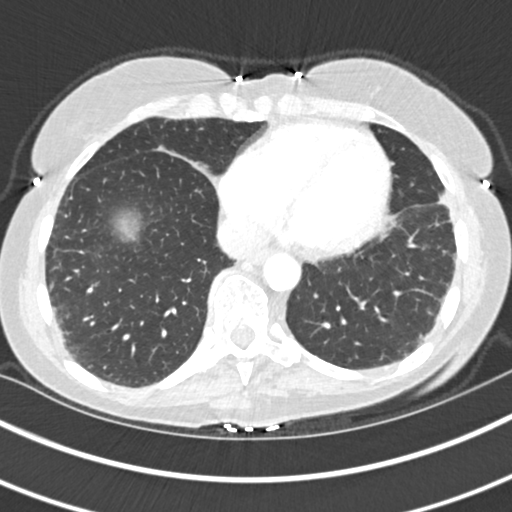
[im 77/166  lung]
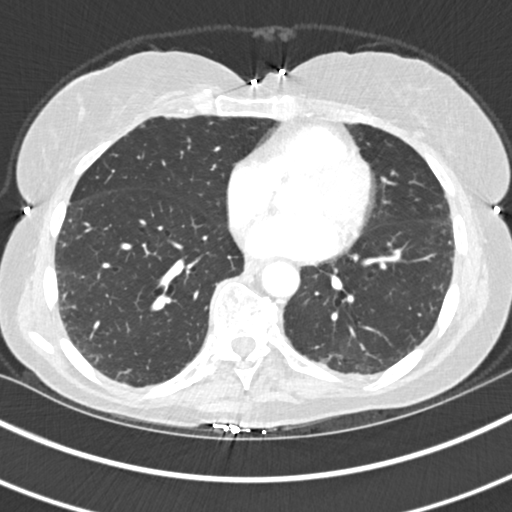
[im 89/166  lung]
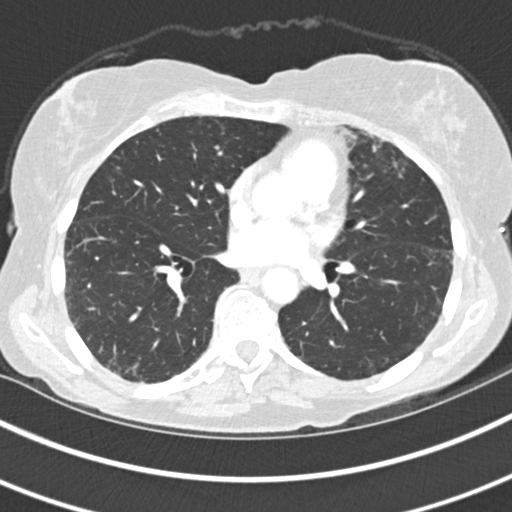
[im 102/166  lung]
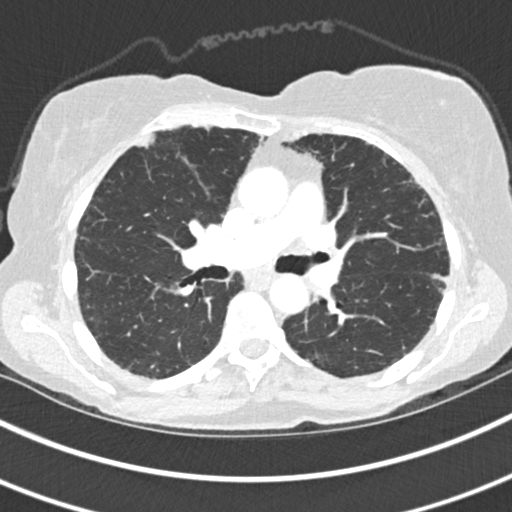
[im 115/166  mediastinal]
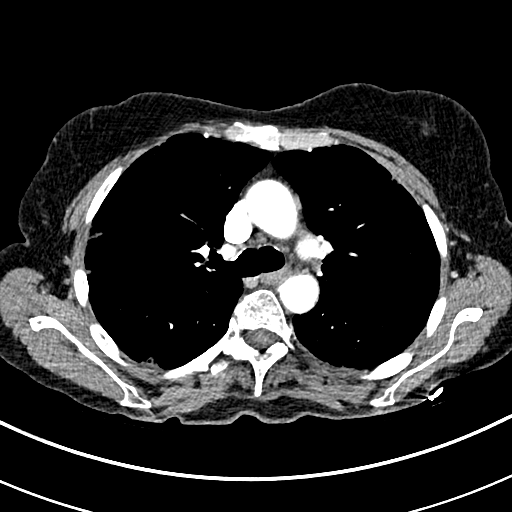
[im 115/166  lung]
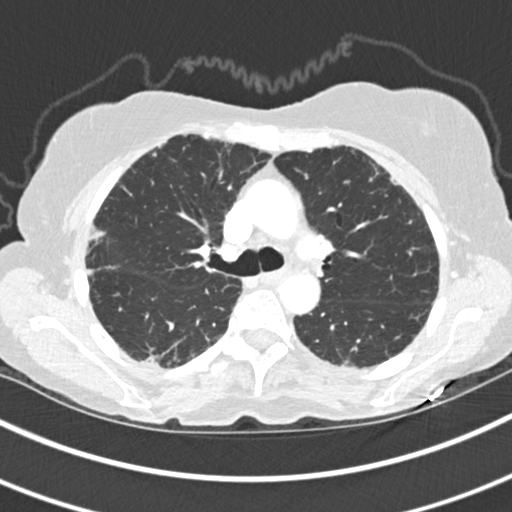
[im 127/166  lung]
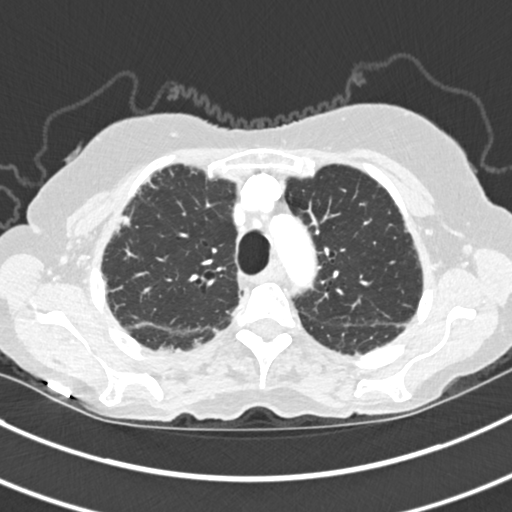
[im 140/166  lung]
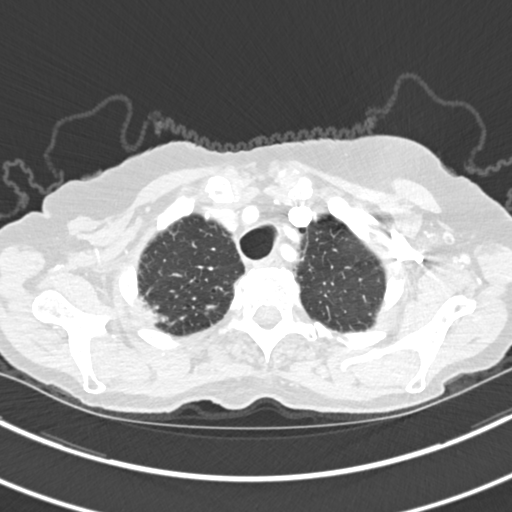
[im 153/166  lung]
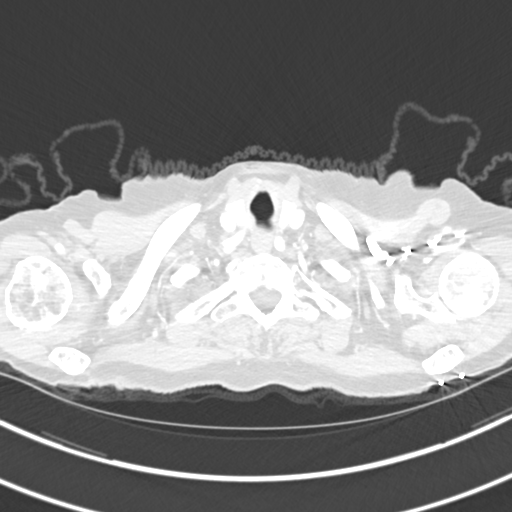

[Series 4: coronal chest 2.00 cor · coronal · 0.64mm/px · 3 of 124 slices shown]
[im 25/124  lung]
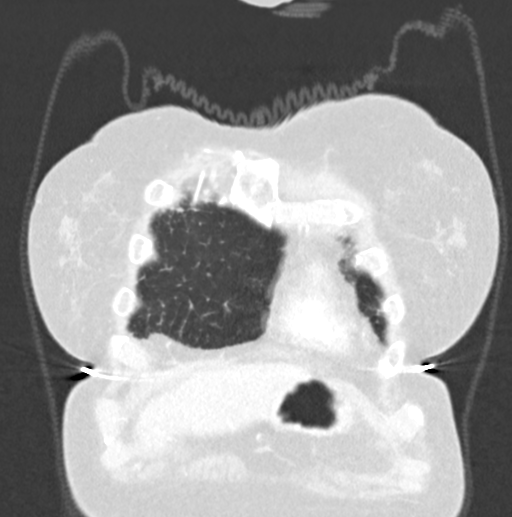
[im 50/124  lung]
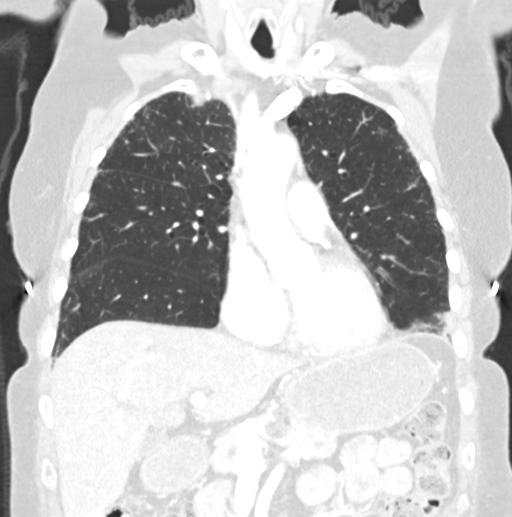
[im 74/124  lung]
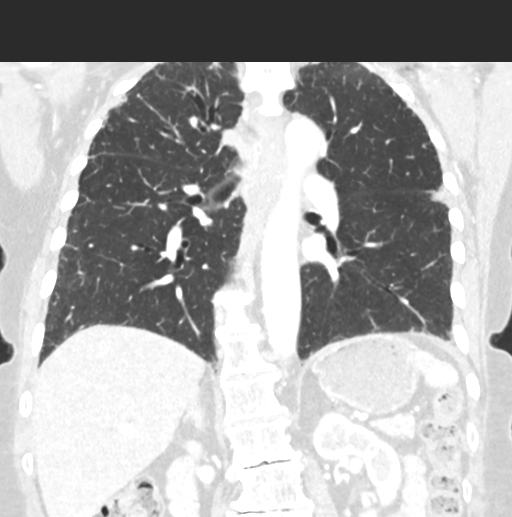

[15 of 36 positions shown; findings below may reference images not displayed]

FINDINGS: Cardiovascular: The heart size appears within normal limits. No
pericardial effusion identified. Aortic atherosclerosis. Lad, left
circumflex coronary artery calcifications identified.

Mediastinum/Nodes: Normal appearance of the thyroid gland. The
trachea appears patent and is midline. Normal appearance of the
esophagus. No enlarged mediastinal or hilar lymph nodes. No axillary
adenopathy.

Lungs/Pleura: Bilateral peripheral interstitial reticulation,
subpleural banding and mild traction bronchiectasis is identified.
Corresponding patchy peripheral areas of ground-glass attenuation
are also noted. No frank honeycombing. Scattered areas
pleuroparenchymal scarring noted perifissural nodule along the major
fissure measures has a mean diameter of 5 mm, image 68/3. Likely
intrapulmonary lymph node.

Upper Abdomen: No acute findings identified. There is mild increase
caliber of the main pancreatic which measures up to 2.6 mm. Pancreas
divisum anatomy is identified which may account for this
abnormality. No acute findings identified within the upper abdomen.

Musculoskeletal: Scoliosis and mild multilevel degenerative disc
disease within the thoracic spine.
IMPRESSION: 1. Spectrum of findings are concerning for early interstitial lung
disease. Specifically, findings may represent either nonspecific
interstitial pneumonitis (NSIP) or early usual interstitial
pneumonia (UIP). Recommend follow-up to resolution CT of the chest
in 6-12 months to assess for any temporal changes in the appearance
of the lungs.
2. Perifissural nodule in the right lung likely represents a
intrapulmonary lymph node. Attention on follow-up imaging advised.
3. Aortic Atherosclerosis ([id number]). Coronary artery
calcifications.

## 2021-10-28 ENCOUNTER — Other Ambulatory Visit: Payer: Self-pay | Admitting: Cardiovascular Disease

## 2021-10-28 ENCOUNTER — Other Ambulatory Visit: Payer: Self-pay

## 2021-11-09 ENCOUNTER — Ambulatory Visit (INDEPENDENT_AMBULATORY_CARE_PROVIDER_SITE_OTHER): Payer: Medicare Other | Admitting: Cardiovascular Disease

## 2021-11-09 ENCOUNTER — Encounter: Payer: Self-pay | Admitting: Cardiovascular Disease

## 2021-11-09 VITALS — BP 118/58 | HR 74 | Ht 66.0 in | Wt 130.4 lb

## 2021-11-09 DIAGNOSIS — R0789 Other chest pain: Secondary | ICD-10-CM

## 2021-11-09 DIAGNOSIS — E1159 Type 2 diabetes mellitus with other circulatory complications: Secondary | ICD-10-CM | POA: Diagnosis not present

## 2021-11-09 DIAGNOSIS — E782 Mixed hyperlipidemia: Secondary | ICD-10-CM | POA: Diagnosis not present

## 2021-11-09 DIAGNOSIS — I2510 Atherosclerotic heart disease of native coronary artery without angina pectoris: Secondary | ICD-10-CM

## 2021-11-09 DIAGNOSIS — I2584 Coronary atherosclerosis due to calcified coronary lesion: Secondary | ICD-10-CM

## 2021-11-09 DIAGNOSIS — I10 Essential (primary) hypertension: Secondary | ICD-10-CM

## 2021-11-09 MED ORDER — EZETIMIBE 10 MG PO TABS
ORAL_TABLET | ORAL | 3 refills | Status: DC
Start: 2021-11-09 — End: 2022-01-31

## 2021-11-09 NOTE — Progress Notes (Signed)
Cardiology Office Note  Date:  11/09/2021   ID:  [name], DOB 11-12-1932, MRN [id number]  PCP:  Adin Hector, MD   Chief Complaint  Patient presents with   12 month follow up     "Doing well." Medications reviewed by the patient verbally.     HPI:  [name] is a 88 year old woman with past medical history of hyperlipidemia,  hypertension,  type 2 diabetes Who presents for evaluation of her chest pain, cardiac risk factors  Last seen in clinic April 2022 In follow-up today reports that she feels well, no significant complaints Trying to stay active, chronic knee issues Family does not want her to have any surgery  Denies significant chest pain on exertion A1c relatively well controlled 6.6 range Cholesterol 150 Total eating statin with Zetia  Reports blood pressure well controlled  EKG personally reviewed by myself on todays visit Normal sinus rhythm rate 74 bpm no significant ST-T wave changes  Other past medical history reviewed CT coronary calcium scoring February 2021 Score of 256  Abdominal CT February 2021,  Concern for early interstitial lung disease, aortic atherosclerosis  Other past medical history reviewed Carotid ultrasound February 2015 No significant disease   PMH:   has a past medical history of Adenomatous polyps, Essential hypertension, benign, Insomnia, Menopausal syndrome, Other and unspecified hyperlipidemia, and Type II or unspecified type diabetes mellitus without mention of complication, not stated as uncontrolled.  PSH:    Past Surgical History:  Procedure Laterality Date   MENISCUS REPAIR      Current Outpatient Medications  Medication Sig Dispense Refill   aspirin 81 MG EC tablet Take 81 mg by mouth daily.     Beclomethasone Dipropionate 80 MCG/ACT AERS Place into the nose as needed.      benazepril (LOTENSIN) 20 MG tablet Take by mouth.     ezetimibe (ZETIA) 10 MG tablet TAKE 1 TABLET(10 MG) BY MOUTH DAILY 90  tablet 0   irbesartan-hydrochlorothiazide (AVALIDE) 150-12.5 MG per tablet Take 1 tablet by mouth daily.     meloxicam (MOBIC) 7.5 MG tablet Take 1 tablet by mouth 2 (two) times daily with a meal.     metformin (FORTAMET) 500 MG (OSM) 24 hr tablet Take 500 mg by mouth daily with breakfast.     metFORMIN (GLUCOPHAGE) 500 MG tablet Take 500 mg by mouth 2 (two) times daily.     Multiple Vitamin (MULTI-VITAMIN) tablet Take 1 tablet by mouth daily.      PARoxetine (PAXIL-CR) 12.5 MG 24 hr tablet Take 12.5 mg by mouth every morning.     simvastatin (ZOCOR) 40 MG tablet Take 40 mg by mouth at bedtime.     No current facility-administered medications for this visit.    Allergies:   Tetanus toxoids   Social History:  The patient  reports that she has never smoked. She has quit using smokeless tobacco. She reports current alcohol use of about 1.0 standard drink of alcohol per week. She reports that she does not use drugs.   Family History:   family history includes Heart disease in her mother; Hyperlipidemia in her mother; Hypertension in her mother.    Review of Systems: Review of Systems  Constitutional: Negative.   HENT: Negative.    Respiratory: Negative.    Cardiovascular: Negative.        Occasional mild chest pressure on the left at rest  Gastrointestinal: Negative.   Musculoskeletal: Negative.   Neurological: Negative.   Psychiatric/Behavioral:  Negative.    All other systems reviewed and are negative.   PHYSICAL EXAM: VS:  BP (!) 118/58 (BP Location: Left Arm, Patient Position: Sitting, Cuff Size: Normal)   Pulse 74   Ht '5\' 6"'$  (1.676 m)   Wt 130 lb 6 oz (59.1 kg)   SpO2 96%   BMI 21.04 kg/m  , BMI Body mass index is 21.04 kg/m. Constitutional:  oriented to person, place, and time. No distress.  HENT:  Head: Grossly normal Eyes:  no discharge. No scleral icterus.  Neck: No JVD, no carotid bruits  Cardiovascular: Regular rate and rhythm, no murmurs  appreciated Pulmonary/Chest: Clear to auscultation bilaterally, no wheezes or rails Abdominal: Soft.  no distension.  no tenderness.  Musculoskeletal: Normal range of motion Neurological:  normal muscle tone. Coordination normal. No atrophy Skin: Skin warm and dry Psychiatric: normal affect, pleasant  Recent Labs: No results found for requested labs within last 365 days.    Lipid Panel No results found for: "CHOL", "HDL", "LDLCALC", "TRIG"    Wt Readings from Last 3 Encounters:  11/09/21 130 lb 6 oz (59.1 kg)  07/08/20 127 lb (57.6 kg)  04/16/19 128 lb 8 oz (58.3 kg)     ASSESSMENT AND PLAN:  Problem List Items Addressed This Visit       Cardiology Problems   Hyperlipidemia   Relevant Orders   EKG 12-Lead     Other   Chest discomfort   Other Visit Diagnoses     Controlled type 2 diabetes mellitus with other circulatory complication, without long-term current use of insulin (HCC)    -  Primary   Relevant Medications   metFORMIN (GLUCOPHAGE) 500 MG tablet   Other Relevant Orders   EKG 12-Lead   Coronary artery calcification       Relevant Orders   EKG 12-Lead   Essential hypertension       Relevant Orders   EKG 12-Lead      Left chest discomfort No recent chest pain symptoms concerning for angina Low calcium scoring No further work-up at this time Cholesterol at goal, diabetes well controlled, non-smoker  Hypertension Blood pressure is well controlled on today's visit. No changes made to the medications.  Type 2 diabetes without complications O1Y well controlled, weight stable  Hyperlipidemia Cholesterol is at goal on the current lipid regimen. No changes to the medications were made.   Total encounter time more than 30 minutes  Greater than 50% was spent in counseling and coordination of care with the patient    Signed, Esmond Plants, M.D., Ph.D. [place], [place]

## 2021-11-09 NOTE — Patient Instructions (Signed)
Medication Instructions:  No changes  If you need a refill on your cardiac medications before your next appointment, please call your pharmacy.   Lab work: No new labs needed  Testing/Procedures: No new testing needed  Follow-Up: At [facility], you and your health needs are our priority.  As part of our continuing mission to provide you with exceptional heart care, we have created designated Provider Care Teams.  These Care Teams include your primary Cardiologist (physician) and Advanced Practice Providers (APPs -  Physician Assistants and Nurse Practitioners) who all work together to provide you with the care you need, when you need it.  You will need a follow up appointment in 12 months  Providers on your designated Care Team:   Christopher Berge, NP Ryan Dunn, PA-C Cadence Furth, PA-C  COVID-19 Vaccine Information can be found at: [email or web address] For questions related to vaccine distribution or appointments, please email [email or web address] or call [phone].   

## 2021-11-15 ENCOUNTER — Telehealth: Payer: Self-pay | Admitting: Cardiovascular Disease

## 2021-11-15 NOTE — Telephone Encounter (Signed)
   Pre-operative Risk Assessment    Patient Name: [name]  DOB: 01-08-1933 MRN: [id number]      Request for Surgical Clearance    Procedure:   right total knee replacement  Date of Surgery:  Clearance TBD                                 Surgeon:  Dr Johnny Bridge Surgeon's Group or Practice Name:  [facility] Phone number:  [phone] Fax number:  [phone]   Type of Clearance Requested:   - Medical    Type of Anesthesia:  Spinal   Additional requests/questions:    SignedAce Gins   11/15/2021, 4:54 PM

## 2021-11-16 NOTE — Telephone Encounter (Signed)
     Primary Cardiologist: Ida Rogue, MD  Chart reviewed as part of pre-operative protocol coverage. Given past medical history and time since last visit, based on ACC/AHA guidelines, [name] would be at acceptable risk for the planned procedure without further cardiovascular testing.   Her RCRI is a class I risk, 0.4% risk of major cardiac event.  I will route this recommendation to the requesting party via Epic fax function and remove from pre-op pool.  Please call with questions.  [name]. Aeon Kessner NP-C     11/16/2021, 11:16 AM [place] [address] Office [phone] Fax [phone]

## 2021-12-23 ENCOUNTER — Encounter (HOSPITAL_COMMUNITY): Payer: Medicare Other

## 2022-01-04 ENCOUNTER — Ambulatory Visit: Admit: 2022-01-04 | Payer: Medicare Other | Admitting: Orthopedic Surgery

## 2022-01-04 SURGERY — ARTHROPLASTY, KNEE, TOTAL
Anesthesia: Choice | Site: Knee | Laterality: Right

## 2022-01-05 ENCOUNTER — Ambulatory Visit: Payer: Medicare Other | Admitting: Dermatology

## 2022-01-26 ENCOUNTER — Ambulatory Visit: Admit: 2022-01-26 | Payer: Medicare Other | Admitting: Ophthalmology

## 2022-01-26 SURGERY — PHACOEMULSIFICATION, CATARACT, WITH IOL INSERTION
Anesthesia: Topical | Laterality: Right

## 2022-01-31 ENCOUNTER — Telehealth: Payer: Self-pay | Admitting: *Deleted

## 2022-01-31 MED ORDER — EZETIMIBE 10 MG PO TABS: ORAL_TABLET | ORAL | 2 refills | Status: DC

## 2022-01-31 NOTE — Telephone Encounter (Signed)
Rx refill sent to pharmacy. 

## 2022-03-17 ENCOUNTER — Ambulatory Visit (INDEPENDENT_AMBULATORY_CARE_PROVIDER_SITE_OTHER): Payer: Medicare Other | Admitting: Dermatology

## 2022-03-17 DIAGNOSIS — T148XXA Other injury of unspecified body region, initial encounter: Secondary | ICD-10-CM

## 2022-03-17 DIAGNOSIS — I2584 Coronary atherosclerosis due to calcified coronary lesion: Secondary | ICD-10-CM | POA: Diagnosis not present

## 2022-03-17 DIAGNOSIS — Z1283 Encounter for screening for malignant neoplasm of skin: Secondary | ICD-10-CM

## 2022-03-17 DIAGNOSIS — S81811A Laceration without foreign body, right lower leg, initial encounter: Secondary | ICD-10-CM | POA: Diagnosis not present

## 2022-03-17 DIAGNOSIS — L570 Actinic keratosis: Secondary | ICD-10-CM

## 2022-03-17 DIAGNOSIS — D229 Melanocytic nevi, unspecified: Secondary | ICD-10-CM

## 2022-03-17 DIAGNOSIS — L814 Other melanin hyperpigmentation: Secondary | ICD-10-CM

## 2022-03-17 DIAGNOSIS — S81812A Laceration without foreign body, left lower leg, initial encounter: Secondary | ICD-10-CM | POA: Diagnosis not present

## 2022-03-17 DIAGNOSIS — S81819A Laceration without foreign body, unspecified lower leg, initial encounter: Secondary | ICD-10-CM

## 2022-03-17 DIAGNOSIS — I2510 Atherosclerotic heart disease of native coronary artery without angina pectoris: Secondary | ICD-10-CM | POA: Diagnosis not present

## 2022-03-17 DIAGNOSIS — L578 Other skin changes due to chronic exposure to nonionizing radiation: Secondary | ICD-10-CM

## 2022-03-17 DIAGNOSIS — D692 Other nonthrombocytopenic purpura: Secondary | ICD-10-CM

## 2022-03-17 DIAGNOSIS — L821 Other seborrheic keratosis: Secondary | ICD-10-CM

## 2022-03-17 MED ORDER — MUPIROCIN 2 % EX OINT: 1.0000 | TOPICAL_OINTMENT | Freq: Every day | CUTANEOUS | 0 refills | Status: DC

## 2022-03-17 NOTE — Progress Notes (Deleted)
   Follow-Up Visit   Subjective  [name] is a 88 y.o. female who presents for the following: Annual Exam (No hx skin cancer, patient does have hx AK's. ).  ***  The following portions of the chart were reviewed this encounter and updated as appropriate:       Review of Systems:  No other skin or systemic complaints except as noted in HPI or Assessment and Plan.  Objective  Well appearing patient in no apparent distress; mood and affect are within normal limits.  {[id number]::"D full examination was performed including scalp, head, eyes, ears, nose, lips, neck, chest, axillae, abdomen, back, buttocks, bilateral upper extremities, bilateral lower extremities, hands, feet, fingers, toes, fingernails, and toenails. All findings within normal limits unless otherwise noted below."}    Assessment & Plan   No follow-ups on file.

## 2022-03-17 NOTE — Patient Instructions (Addendum)
Recommend [facility] Hypochlorous Spray (found in the wound care section). The [facility] Hypochlorous Spray can be sprayed on daily and left on.   Apply mupirocin after cleaning and cover.  Melanoma ABCDEs  Melanoma is the most dangerous type of skin cancer, and is the leading cause of death from skin disease.  You are more likely to develop melanoma if you: Have light-colored skin, light-colored eyes, or red or blond hair Spend a lot of time in the sun Tan regularly, either outdoors or in a tanning bed Have had blistering sunburns, especially during childhood Have a close family member who has had a melanoma Have atypical moles or large birthmarks  Early detection of melanoma is key since treatment is typically straightforward and cure rates are extremely high if we catch it early.   The first sign of melanoma is often a change in a mole or a new dark spot.  The ABCDE system is a way of remembering the signs of melanoma.  A for asymmetry:  The two halves do not match. B for border:  The edges of the growth are irregular. C for color:  A mixture of colors are present instead of an even brown color. D for diameter:  Melanomas are usually (but not always) greater than 69m - the size of a pencil eraser. E for evolution:  The spot keeps changing in size, shape, and color.  Please check your skin once per month between visits. You can use a small mirror in front and a large mirror behind you to keep an eye on the back side or your body.   If you see any new or changing lesions before your next follow-up, please call to schedule a visit.  Please continue daily skin protection including broad spectrum sunscreen SPF 30+ to sun-exposed areas, reapplying every 2 hours as needed when you're outdoors.    Due to recent changes in healthcare laws, you may see results of your pathology and/or laboratory studies on MyChart before the doctors have had a chance to review them. We understand that in some  cases there may be results that are confusing or concerning to you. Please understand that not all results are received at the same time and often the doctors may need to interpret multiple results in order to provide you with the best plan of care or course of treatment. Therefore, we ask that you please give u[place]2 business days to thoroughly review all your results before contacting the office for clarification. Should we see a critical lab result, you will be contacted sooner.   If You Need Anything After Your Visit  If you have any questions or concerns for your doctor, please call our main line at [phone] press option 4 to reach your doctor's medical assistant. If no one answers, please leave a voicemail as directed and we will return your call as soon as possible. Messages left after 4 pm will be answered the following business day.   You may also send [place] message via [facility] We typically respond to MyChart messages within 1-2 business days.  For prescription refills, please ask your pharmacy to contact our office. Our fax number is [phone]  If you have an urgent issue when the clinic is closed that cannot wait until the next business day, you can page your doctor at the number below.    Please note that while we do our best to be available for urgent issues outside of office hours, we are not  available 24/7.   If you have an urgent issue and are unable to reach [place], you may choose to seek medical care at your doctor's office, retail clinic, urgent care center, or emergency room.  If you have a medical emergency, please immediately call 911 or go to the emergency department.  Pager Numbers  - Dr. Nehemiah Massed: [phone]  - Dr. Laurence Ferrari: [phone]  - Dr. Nicole Kindred: [phone]  In the event of inclement weather, please call our main line at [phone] for an update on the status of any delays or closures.  Dermatology Medication Tips: Please keep the boxes that  topical medications come in in order to help keep track of the instructions about where and how to use these. Pharmacies typically print the medication instructions only on the boxes and not directly on the medication tubes.   If your medication is too expensive, please contact our office at [phone] option 4 or send [place] a message through [place].   We are unable to tell what your co-pay for medications will be in advance as this is different depending on your insurance coverage. However, we may be able to find a substitute medication at lower cost or fill out paperwork to get insurance to cover a needed medication.   If a prior authorization is required to get your medication covered by your insurance company, please allow [place] 1-2 business days to complete this process.  Drug prices often vary depending on where the prescription is filled and some pharmacies may offer cheaper prices.  The website www.goodrx.com contains coupons for medications through different pharmacies. The prices here do not account for what the cost may be with help from insurance (it may be cheaper with your insurance), but the website can give you the price if you did not use any insurance.  - You can print the associated coupon and take it with your prescription to the pharmacy.  - You may also stop by our office during regular business hours and pick up a GoodRx coupon card.  - If you need your prescription sent electronically to a different pharmacy, notify our office through [facility] or by phone at [phone] option 4.     Si Usted Necesita Algo Despus de Su Visita  Tambin puede enviarnos un mensaje a travs de Pharmacist, community. Por lo general respondemos a los mensajes de MyChart en el transcurso de 1 a 2 das hbiles.  Para renovar recetas, por favor pida a su farmacia que se ponga en contacto con nuestra oficina. Harland Dingwall de fax es [place] [phone].  Si tiene un asunto urgente cuando la clnica  est cerrada y que no puede esperar hasta el siguiente da hbil, puede llamar/localizar a su doctor(a) al nmero que aparece a continuacin.   Por favor, tenga en cuenta que aunque hacemos todo lo posible para estar disponibles para asuntos urgentes fuera del horario de [place], no estamos disponibles las 24 horas del da, los 7 das de la [place].   Si tiene un problema urgente y no puede comunicarse con nosotros, puede optar por buscar atencin mdica  en el consultorio de su doctor(a), en una clnica privada, en un centro de atencin urgente o en una sala de emergencias.  Si tiene Engineering geologist, por favor llame inmediatamente al 911 o vaya a la sala de emergencias.  Nmeros de bper  - Dr. Nehemiah Massed: [phone]  - Dra. Moye: [phone]  - Dra. Nicole Kindred: [phone]  En caso de inclemencias del tiempo, por favor llame  a Johnsie Kindred principal al [phone] para una actualizacin sobre el [place] de cualquier retraso o cierre.  Consejos para la medicacin en dermatologa: Por favor, guarde las cajas en las que vienen los medicamentos de uso tpico para ayudarle a seguir las instrucciones sobre dnde y cmo usarlos. Las farmacias generalmente imprimen las instrucciones del medicamento slo en las cajas y no directamente en los tubos del [place].   Si su medicamento es muy caro, por favor, pngase en contacto con Zigmund Daniel llamando al [phone] y presione la opcin 4 o envenos un mensaje a travs de Pharmacist, community.   No podemos decirle cul ser su copago por los medicamentos por adelantado ya que esto es diferente dependiendo de la cobertura de su seguro. Sin embargo, es posible que podamos encontrar un medicamento sustituto a Electrical engineer un formulario para que el seguro cubra el medicamento que se considera necesario.   Si se requiere una autorizacin previa para que su compaa de seguros [place] su medicamento, por favor permtanos de 1 a 2 das hbiles para  completar este proceso.  Los precios de los medicamentos varan con frecuencia dependiendo del Environmental consultant de dnde se surte la receta y alguna farmacias pueden ofrecer precios ms baratos.  El sitio web www.goodrx.com tiene cupones para medicamentos de Airline pilot. Los precios aqu no tienen en cuenta lo que podra costar con la ayuda del seguro (puede ser ms barato con su seguro), pero el sitio web puede darle el precio si no utiliz Research scientist (physical sciences).  - Puede imprimir el cupn correspondiente y llevarlo con su receta a la farmacia.  - Tambin puede pasar por nuestra oficina durante el horario de atencin regular y Charity fundraiser una tarjeta de cupones de GoodRx.  - Si necesita que su receta se enve electrnicamente a una farmacia diferente, informe a nuestra oficina a travs de MyChart de [place] o por telfono llamando al [phone] y presione la opcin 4.

## 2022-03-17 NOTE — Progress Notes (Unsigned)
Follow-Up Visit   Subjective  [name] is a 88 y.o. female who presents for the following: Annual Exam (No hx skin cancer, patient does have hx AK's. ). The patient presents for Total-Body Skin Exam (TBSE) for skin cancer screening and mole check.  The patient has spots, moles and lesions to be evaluated, some may be new or changing and the patient has concerns that these could be cancer.  The following portions of the chart were reviewed this encounter and updated as appropriate:   Tobacco  Allergies  Meds  Problems  Med Hx  Surg Hx  Fam Hx     Review of Systems:  No other skin or systemic complaints except as noted in HPI or Assessment and Plan.  Objective  Well appearing patient in no apparent distress; mood and affect are within normal limits.  A full examination was performed including scalp, head, eyes, ears, nose, lips, neck, chest, axillae, abdomen, back, buttocks, bilateral upper extremities, bilateral lower extremities, hands, feet, fingers, toes, fingernails, and toenails. All findings within normal limits unless otherwise noted below.  Nose Erythematous thin papules/macules with gritty scale.   lower legs Open wounds   Assessment & Plan  AK (actinic keratosis) Nose Actinic keratoses are precancerous spots that appear secondary to cumulative UV radiation exposure/sun exposure over time. They are chronic with expected duration over 1 year. A portion of actinic keratoses will progress to squamous cell carcinoma of the skin. It is not possible to reliably predict which spots will progress to skin cancer and so treatment is recommended to prevent development of skin cancer.  Recommend daily broad spectrum sunscreen SPF 30+ to sun-exposed areas, reapply every 2 hours as needed.  Recommend staying in the shade or wearing long sleeves, sun glasses (UVA+UVB protection) and wide brim hats (4-inch brim around the entire circumference of the hat). Call for new or  changing lesions.  Destruction of lesion - Nose Complexity: simple   Destruction method: cryotherapy   Informed consent: discussed and consent obtained   Timeout:  patient name, date of birth, surgical site, and procedure verified Lesion destroyed using liquid nitrogen: Yes   Region frozen until ice ball extended beyond lesion: Yes   Outcome: patient tolerated procedure well with no complications   Post-procedure details: wound care instructions given    Open wound from recent fall and skin tear/laceration lower legs Start mupirocin daily to affected areas and cover Areas cleaned today with Puracyn, mupirocin and wrap applied.   Recommend [facility] Hypochlorous Spray (found in the wound care section). The [facility] Hypochlorous Spray can be sprayed on daily and left on.   Prescription sent to [facility] for Coban and Telfa pads.   mupirocin ointment (BACTROBAN) 2 % - lower legs Apply 1 Application topically daily. And cover  Lentigines - Scattered tan macules - Due to sun exposure - Benign-appearing, observe - Recommend daily broad spectrum sunscreen SPF 30+ to sun-exposed areas, reapply every 2 hours as needed. - Call for any changes  Seborrheic Keratoses - Stuck-on, waxy, tan-brown papules and/or plaques  - Benign-appearing - Discussed benign etiology and prognosis. - Observe - Call for any changes  Melanocytic Nevi - Tan-brown and/or pink-flesh-colored symmetric macules and papules - Benign appearing on exam today - Observation - Call clinic for new or changing moles - Recommend daily use of broad spectrum spf 30+ sunscreen to sun-exposed areas.   Hemangiomas - Red papules - Discussed benign nature - Observe - Call for any changes  Actinic  Damage - Chronic condition, secondary to cumulative UV/sun exposure - diffuse scaly erythematous macules with underlying dyspigmentation - Recommend daily broad spectrum sunscreen SPF 30+ to sun-exposed areas, reapply every 2  hours as needed.  - Staying in the shade or wearing long sleeves, sun glasses (UVA+UVB protection) and wide brim hats (4-inch brim around the entire circumference of the hat) are also recommended for sun protection.  - Call for new or changing lesions.  Skin cancer screening performed today.  Purpura - Chronic; persistent and recurrent.  Treatable, but not curable. - Violaceous macules and patches - Benign - Related to trauma, age, sun damage and/or use of blood thinners, chronic use of topical and/or oral steroids - Observe - Can use OTC arnica containing moisturizer such as Dermend Bruise Formula if desired - Call for worsening or other concerns  Return in about 1 year (around 03/18/2023) for TBSE, Hx AK.  [name], RMA, am acting as scribe for Sarina Ser, MD . Documentation: I have reviewed the above documentation for accuracy and completeness, and I agree with the above.  Sarina Ser, MD

## 2022-03-23 ENCOUNTER — Other Ambulatory Visit (INDEPENDENT_AMBULATORY_CARE_PROVIDER_SITE_OTHER): Payer: Self-pay | Admitting: Internal Medicine

## 2022-03-23 ENCOUNTER — Ambulatory Visit (INDEPENDENT_AMBULATORY_CARE_PROVIDER_SITE_OTHER): Payer: Medicare Other

## 2022-03-23 ENCOUNTER — Encounter (INDEPENDENT_AMBULATORY_CARE_PROVIDER_SITE_OTHER): Payer: Self-pay | Admitting: Nurse Practitioner

## 2022-03-23 ENCOUNTER — Ambulatory Visit (INDEPENDENT_AMBULATORY_CARE_PROVIDER_SITE_OTHER): Payer: Medicare Other | Admitting: Nurse Practitioner

## 2022-03-23 VITALS — BP 169/81 | HR 82 | Resp 16 | Ht 65.0 in | Wt 131.0 lb

## 2022-03-23 DIAGNOSIS — E119 Type 2 diabetes mellitus without complications: Secondary | ICD-10-CM | POA: Diagnosis not present

## 2022-03-23 DIAGNOSIS — M7989 Other specified soft tissue disorders: Secondary | ICD-10-CM | POA: Diagnosis not present

## 2022-03-23 DIAGNOSIS — M7120 Synovial cyst of popliteal space [Baker], unspecified knee: Secondary | ICD-10-CM

## 2022-03-23 NOTE — Progress Notes (Signed)
Subjective:    Patient ID: [name], female    DOB: 14-Jun-1932, 88 y.o.   MRN: [id number] Chief Complaint  Patient presents with   New Patient (Initial Visit)    Ultrasound    [name] is a 88 year old female referred by her primary care physician Dr. Caryl Comes in regards to lower extremity edema.  The patient does have some issues with arthritis in the knees.  She also notes having some pain behind the right knee when she stands but no classic claudication symptoms such as pain with ambulation or rest pain.  She notes that the swelling occurred suddenly and is more so concentrated around her ankle and foot area.  The patient has not worn medical grade compression.  She notes that during the holiday she has not traveled any significant distances but she has been visiting and sitting more as well as eating more than normal.  Currently there are no open wounds or ulcerations  Today noninvasive studies show no evidence of DVT or superficial thrombophlebitis bilaterally.  The patient does have Baker's cyst bilaterally.  The right measures 3.6 x 2.29 cm with the left measuring 1.54 x 0.92 centimeter    Review of Systems  Cardiovascular:  Positive for leg swelling.  Neurological:  Positive for weakness.  All other systems reviewed and are negative.      Objective:   Physical Exam Vitals reviewed.  HENT:     Head: Normocephalic.  Cardiovascular:     Rate and Rhythm: Normal rate.     Pulses: Normal pulses.  Pulmonary:     Effort: Pulmonary effort is normal.  Musculoskeletal:     Right lower leg: Edema present.     Left lower leg: Edema present.  Skin:    General: Skin is warm and dry.  Neurological:     Mental Status: She is alert and oriented to person, place, and time.     Motor: Weakness present.  Psychiatric:        Mood and Affect: Mood normal.        Behavior: Behavior normal.        Thought Content: Thought content normal.        Judgment: Judgment normal.      BP (!) 169/81 (BP Location: Right Arm)   Pulse 82   Resp 16   Ht '5\' 5"'$  (1.651 m)   Wt 131 lb (59.4 kg)   BMI 21.80 kg/m   Past Medical History:  Diagnosis Date   Adenomatous polyps    on colonoscopy   Essential hypertension, benign    Insomnia    Menopausal syndrome    Other and unspecified hyperlipidemia    Type II or unspecified type diabetes mellitus without mention of complication, not stated as uncontrolled     Social History   Socioeconomic History   Marital status: Married    Spouse name: Not on file   Number of children: Not on file   Years of education: Not on file   Highest education level: Not on file  Occupational History   Not on file  Tobacco Use   Smoking status: Never   Smokeless tobacco: Former  Scientific laboratory technician Use: Never used  Substance and Sexual Activity   Alcohol use: Yes    Alcohol/week: 1.0 standard drink of alcohol    Types: 1 Standard drinks or equivalent per week   Drug use: No   Sexual activity: Not on file  Other Topics Concern  Not on file  Social History Narrative   Not on file   Social Determinants of Health   Financial Resource Strain: Not on file  Food Insecurity: Not on file  Transportation Needs: Not on file  Physical Activity: Not on file  Stress: Not on file  Social Connections: Not on file  Intimate Partner Violence: Not on file    Past Surgical History:  Procedure Laterality Date   MENISCUS REPAIR      Family History  Problem Relation Age of Onset   Heart disease Mother    Hyperlipidemia Mother    Hypertension Mother    Breast cancer Neg Hx     Allergies  Allergen Reactions   Tetanus Toxoids         No data to display            CMP     Component Value Date/Time   CREATININE 1.00 05/21/2019 1258     No results found.     Assessment & Plan:   1. Swelling of limb The patient has no evidence of DVT noted today.  Based on onset I suspect it may be multifactoral and episodic  from the holidays.  I discussed with the patient the use of conservative therapy including medical grade compression (15-20 mmHG), elevation and activity.  The patient will try these conservative therapy tactics and we will have her return in approximately 8 weeks for reevaluation.  2. Controlled type 2 diabetes mellitus without complication, without long-term current use of insulin ([place]) Continue hypoglycemic medications as already ordered, these medications have been reviewed and there are no changes at this time.  Hgb A1C to be monitored as already arranged by primary service  3. Synovial cyst of knee, unspecified laterality The patient does have noted bilateral Baker's cyst.  While not the cause of her swelling it may be the cause of discomfort behind her right knee.  Patient does have an upcoming orthopedic appointment and have advised him to discuss with the orthopedic specialist.   Current Outpatient Medications on File Prior to Visit  Medication Sig Dispense Refill   aspirin 81 MG EC tablet Take 81 mg by mouth daily.     Beclomethasone Dipropionate 80 MCG/ACT AERS Place into the nose as needed.      benazepril (LOTENSIN) 20 MG tablet Take by mouth.     ezetimibe (ZETIA) 10 MG tablet Please take one daily 90 tablet 2   meloxicam (MOBIC) 7.5 MG tablet Take 1 tablet by mouth 2 (two) times daily with a meal.     metformin (FORTAMET) 500 MG (OSM) 24 hr tablet Take 500 mg by mouth daily with breakfast.     metFORMIN (GLUCOPHAGE) 500 MG tablet Take 500 mg by mouth 2 (two) times daily.     Multiple Vitamin (MULTI-VITAMIN) tablet Take 1 tablet by mouth daily.      mupirocin ointment (BACTROBAN) 2 % Apply 1 Application topically daily. And cover 22 g 0   PARoxetine (PAXIL-CR) 12.5 MG 24 hr tablet Take 12.5 mg by mouth every morning.     simvastatin (ZOCOR) 40 MG tablet Take 40 mg by mouth at bedtime.     No current facility-administered medications on file prior to visit.    There are no  Patient Instructions on file for this visit. No follow-ups on file.   Kris Hartmann, NP

## 2022-03-26 ENCOUNTER — Encounter: Payer: Self-pay | Admitting: Dermatology

## 2022-05-03 ENCOUNTER — Ambulatory Visit (INDEPENDENT_AMBULATORY_CARE_PROVIDER_SITE_OTHER): Payer: Medicare Other | Admitting: Vascular Surgery

## 2022-05-27 ENCOUNTER — Telehealth: Payer: Self-pay | Admitting: Cardiovascular Disease

## 2022-05-27 NOTE — Telephone Encounter (Signed)
Pt called c/o elevated BP for the past 3 days. Pt asymptomatic but stated her son's a cardiologist and felt as if she needs a stronger medication than benazepril 20 mg daily  2/28-140/80 2/29-151/83 3/1-145/87,140/86  Will forward to MD for recommendations.

## 2022-05-27 NOTE — Telephone Encounter (Signed)
Pt c/o BP issue: STAT if pt c/o blurred vision, one-sided weakness or slurred speech  1. What are your last 5 BP readings?  140/80 151/83 146/87  2. Are you having any other symptoms (ex. Dizziness, headache, blurred vision, passed out)? Patient states no symptoms  3. What is your BP issue? Patient states her BP is running high.  She thinks her medication needs to be adjusting.

## 2022-05-30 NOTE — Telephone Encounter (Signed)
Spoke with patient and informed her Dr. Donivan Scull recommendations as follows:  Would make sure blood pressure measurements are being made 2 hours after taking benazepril. If pressures obtained are 2 hours or more after taking medication. She could try to increase the dose of benazepril up to 30 mg daily. Would continue to monitor blood pressure for 2 weeks and if it continues to run high. We could add HCTZ 12.5 mg daily Thx TGolan   Patient understood with read back

## 2022-07-26 ENCOUNTER — Encounter: Payer: Self-pay | Admitting: Ophthalmology

## 2022-08-01 NOTE — Discharge Instructions (Signed)

## 2022-08-03 ENCOUNTER — Ambulatory Visit
Admission: RE | Admit: 2022-08-03 | Discharge: 2022-08-03 | Disposition: A | Discharge status: Home / Self Care | Payer: Medicare Other | Attending: Ophthalmology | Admitting: Ophthalmology

## 2022-08-03 ENCOUNTER — Ambulatory Visit: Payer: Medicare Other | Admitting: Anesthesiology

## 2022-08-03 ENCOUNTER — Encounter
Admission: RE | Disposition: A | Discharge status: Home / Self Care | Payer: Self-pay | Source: Home / Self Care | Attending: Ophthalmology

## 2022-08-03 ENCOUNTER — Encounter: Payer: Self-pay | Admitting: Ophthalmology

## 2022-08-03 ENCOUNTER — Other Ambulatory Visit: Payer: Self-pay

## 2022-08-03 ENCOUNTER — Ambulatory Visit: Admit: 2022-08-03 | Payer: Medicare Other | Admitting: Ophthalmology

## 2022-08-03 DIAGNOSIS — Z7984 Long term (current) use of oral hypoglycemic drugs: Secondary | ICD-10-CM | POA: Insufficient documentation

## 2022-08-03 DIAGNOSIS — I10 Essential (primary) hypertension: Secondary | ICD-10-CM | POA: Insufficient documentation

## 2022-08-03 DIAGNOSIS — H2511 Age-related nuclear cataract, right eye: Secondary | ICD-10-CM | POA: Diagnosis not present

## 2022-08-03 DIAGNOSIS — E1136 Type 2 diabetes mellitus with diabetic cataract: Secondary | ICD-10-CM | POA: Insufficient documentation

## 2022-08-03 HISTORY — PX: CATARACT EXTRACTION W/PHACO: SHX586

## 2022-08-03 HISTORY — DX: Unspecified osteoarthritis, unspecified site: M19.90

## 2022-08-03 HISTORY — DX: Presence of external hearing-aid: Z97.4

## 2022-08-03 LAB — GLUCOSE, CAPILLARY: Glucose-Capillary: 88 mg/dL (ref 70–99)

## 2022-08-03 SURGERY — PHACOEMULSIFICATION, CATARACT, WITH IOL INSERTION
Anesthesia: Topical | Laterality: Left

## 2022-08-03 SURGERY — PHACOEMULSIFICATION, CATARACT, WITH IOL INSERTION
Anesthesia: Monitor Anesthesia Care | Laterality: Right

## 2022-08-03 MED ORDER — SIGHTPATH DOSE#1 BSS IO SOLN
INTRAOCULAR | Status: DC | PRN
  Administered 2022-08-03: 2 mL

## 2022-08-03 MED ORDER — FENTANYL CITRATE (PF) 100 MCG/2ML IJ SOLN
INTRAMUSCULAR | Status: DC | PRN
  Administered 2022-08-03: 50 ug via INTRAVENOUS

## 2022-08-03 MED ORDER — SIGHTPATH DOSE#1 BSS IO SOLN
INTRAOCULAR | Status: DC | PRN
  Administered 2022-08-03 (×2): 15 mL via INTRAOCULAR

## 2022-08-03 MED ORDER — MIDAZOLAM HCL 2 MG/2ML IJ SOLN
INTRAMUSCULAR | Status: DC | PRN
  Administered 2022-08-03: 1 mg via INTRAVENOUS

## 2022-08-03 MED ORDER — SIGHTPATH DOSE#1 BSS IO SOLN
INTRAOCULAR | Status: DC | PRN
  Administered 2022-08-03: 89 mL via OPHTHALMIC

## 2022-08-03 MED ORDER — SIGHTPATH DOSE#1 NA HYALUR & NA CHOND-NA HYALUR IO KIT
PACK | INTRAOCULAR | Status: DC | PRN
  Administered 2022-08-03: 1 via OPHTHALMIC

## 2022-08-03 MED ORDER — TETRACAINE HCL 0.5 % OP SOLN
1.0000 [drp] | OPHTHALMIC | Status: DC | PRN
  Administered 2022-08-03 (×3): 1 [drp] via OPHTHALMIC

## 2022-08-03 MED ORDER — CEFUROXIME OPHTHALMIC INJECTION 1 MG/0.1 ML
INJECTION | OPHTHALMIC | Status: DC | PRN
  Administered 2022-08-03 (×2): 1 mg via INTRACAMERAL

## 2022-08-03 MED ORDER — ARMC OPHTHALMIC DILATING DROPS
1.0000 | OPHTHALMIC | Status: DC | PRN
  Administered 2022-08-03 (×3): 1 via OPHTHALMIC

## 2022-08-03 SURGICAL SUPPLY — 9 items
CATARACT SUITE SIGHTPATH (MISCELLANEOUS) ×1 IMPLANT
FEE CATARACT SUITE SIGHTPATH (MISCELLANEOUS) ×1 IMPLANT
GLOVE SRG 8 PF TXTR STRL LF DI (GLOVE) ×1 IMPLANT
GLOVE SURG ENC TEXT LTX SZ7.5 (GLOVE) ×1 IMPLANT
GLOVE SURG UNDER POLY LF SZ8 (GLOVE) ×1
LENS IOL TECNIS EYHANCE 24.0 (Intraocular Lens) IMPLANT
NDL FILTER BLUNT 18X1 1/2 (NEEDLE) ×1 IMPLANT
NEEDLE FILTER BLUNT 18X1 1/2 (NEEDLE) ×1 IMPLANT
SYR 3ML LL SCALE MARK (SYRINGE) ×1 IMPLANT

## 2022-08-03 NOTE — H&P (Signed)
[facility]   Primary Care Physician:  Lynnea Ferrier, MD Ophthalmologist: Dr. Lockie Mola  Pre-Procedure History & Physical: HPI:  [name] is a 89 y.o. female here for ophthalmic surgery.   Past Medical History:  Diagnosis Date   Adenomatous polyps    on colonoscopy   Arthritis    Essential hypertension, benign    Insomnia    Menopausal syndrome    Other and unspecified hyperlipidemia    Type II or unspecified type diabetes mellitus without mention of complication, not stated as uncontrolled    Wears hearing aid in both ears     Past Surgical History:  Procedure Laterality Date   MENISCUS REPAIR      Prior to Admission medications   Medication Sig Start Date End Date Taking? Authorizing Provider  aspirin 81 MG EC tablet Take 81 mg by mouth daily.   Yes [provider]  Beclomethasone Dipropionate 80 MCG/ACT AERS Place into the nose as needed.  04/14/15  Yes [provider]  benazepril (LOTENSIN) 20 MG tablet Take 30 mg by mouth daily. 07/16/19  Yes [provider]  ezetimibe (ZETIA) 10 MG tablet Please take one daily 01/31/22  Yes Gollan, Tollie Pizza, MD  fexofenadine (ALLEGRA) 180 MG tablet Take 180 mg by mouth at bedtime.   Yes [provider]  meloxicam (MOBIC) 7.5 MG tablet Take 15 mg by mouth daily.   Yes [provider]  metFORMIN (GLUCOPHAGE) 500 MG tablet Take 500 mg by mouth 2 (two) times daily. 11/05/21  Yes [provider]  Multiple Vitamin (MULTI-VITAMIN) tablet Take 1 tablet by mouth daily.    Yes [provider]  mupirocin ointment (BACTROBAN) 2 % Apply 1 Application topically daily. And cover 03/17/22  Yes Deirdre Evener, MD  PARoxetine (PAXIL-CR) 12.5 MG 24 hr tablet Take 20 mg by mouth every morning.   Yes [provider]  simvastatin (ZOCOR) 40 MG tablet Take 40 mg by mouth at bedtime.   Yes [provider]    Allergies as of 07/11/2022 - Review Complete  03/26/2022  Allergen Reaction Noted   Tetanus toxoids  02/06/2019    Family History  Problem Relation Age of Onset   Heart disease Mother    Hyperlipidemia Mother    Hypertension Mother    Breast cancer Neg Hx     Social History   Socioeconomic History   Marital status: Married    Spouse name: Not on file   Number of children: Not on file   Years of education: Not on file   Highest education level: Not on file  Occupational History   Not on file  Tobacco Use   Smoking status: Never   Smokeless tobacco: Never  Vaping Use   Vaping Use: Never used  Substance and Sexual Activity   Alcohol use: Yes    Alcohol/week: 7.0 standard drinks of alcohol    Types: 7 Glasses of wine per week   Drug use: No   Sexual activity: Not on file  Other Topics Concern   Not on file  Social History Narrative   Not on file   Social Determinants of Health   Financial Resource Strain: Not on file  Food Insecurity: Not on file  Transportation Needs: Not on file  Physical Activity: Not on file  Stress: Not on file  Social Connections: Not on file  Intimate Partner Violence: Not on file    Review of Systems: See HPI, otherwise negative  ROS  Physical Exam: BP (!) 153/82   Pulse 71   Temp (!) 97 F (36.1 C) (Temporal)   Resp 15   Ht 5\' 5"  (1.651 m)   Wt 58.6 kg   SpO2 98%   BMI 21.48 kg/m  General:   Alert,  pleasant and cooperative in NAD Head:  Normocephalic and atraumatic. Lungs:  Clear to auscultation.    Heart:  Regular rate and rhythm.   Impression/Plan: Burman Freestone is here for ophthalmic surgery.  Risks, benefits, limitations, and alternatives regarding ophthalmic surgery have been reviewed with the patient.  Questions have been answered.  All parties agreeable.   Lockie Mola, MD  08/03/2022, 8:37 AM

## 2022-08-03 NOTE — Transfer of Care (Signed)
Immediate Anesthesia Transfer of Care Note  Patient: [name]  Procedure(s) Performed: CATARACT EXTRACTION PHACO AND INTRAOCULAR LENS PLACEMENT (IOC) RIGHT DIABETIC 22.93 1.42.1 (Right)  Patient Location: PACU  Anesthesia Type: MAC  Level of Consciousness: awake, alert  and patient cooperative  Airway and Oxygen Therapy: Patient Spontanous Breathing and Patient connected to supplemental oxygen  Post-op Assessment: Post-op Vital signs reviewed, Patient's Cardiovascular Status Stable, Respiratory Function Stable, Patent Airway and No signs of Nausea or vomiting  Post-op Vital Signs: Reviewed and stable  Complications: No notable events documented.

## 2022-08-03 NOTE — Anesthesia Preprocedure Evaluation (Signed)
Anesthesia Evaluation  Patient identified by MRN, date of birth, ID band Patient awake    Reviewed: Allergy & Precautions, H&P , NPO status , Patient's Chart, lab work & pertinent test results  Airway Mallampati: III  TM Distance: <3 FB Neck ROM: Full    Dental no notable dental hx.  Some caps :   Pulmonary neg pulmonary ROS Hx sleep apnea but says not now    Pulmonary exam normal breath sounds clear to auscultation       Cardiovascular hypertension, Normal cardiovascular exam Rhythm:Regular Rate:Normal  Hx "senile purpura"    Neuro/Psych  PSYCHIATRIC DISORDERS  Depression    negative neurological ROS     GI/Hepatic negative GI ROS, Neg liver ROS,,,  Endo/Other  negative endocrine ROSdiabetes, Type 2, Oral Hypoglycemic Agents    Renal/GU negative Renal ROS  negative genitourinary   Musculoskeletal  (+) Arthritis , Osteoarthritis,    Abdominal   Peds negative pediatric ROS (+)  Hematology negative hematology ROS (+)   Anesthesia Other Findings Hands extremely cold, difficult to obtain pulse ox, placed warm blanket on hands and on patient   Reproductive/Obstetrics negative OB ROS                              Anesthesia Physical Anesthesia Plan  ASA: 2  Anesthesia Plan: MAC   Post-op Pain Management:    Induction: Intravenous  PONV Risk Score and Plan:   Airway Management Planned: Natural Airway and Nasal Cannula  Additional Equipment:   Intra-op Plan:   Post-operative Plan:   Informed Consent: I have reviewed the patients History and Physical, chart, labs and discussed the procedure including the risks, benefits and alternatives for the proposed anesthesia with the patient or authorized representative who has indicated his/her understanding and acceptance.     Dental Advisory Given  Plan Discussed with: Anesthesiologist, CRNA and Surgeon  Anesthesia Plan Comments:  (Patient consented for risks of anesthesia including but not limited to:  - adverse reactions to medications - damage to eyes, teeth, lips or other oral mucosa - nerve damage due to positioning  - sore throat or hoarseness - Damage to heart, brain, nerves, lungs, other parts of body or loss of life  Patient voiced understanding.)         Anesthesia Quick Evaluation

## 2022-08-03 NOTE — Op Note (Signed)
LOCATION:  [facility]   PREOPERATIVE DIAGNOSIS:    Nuclear sclerotic cataract right eye. H25.11   POSTOPERATIVE DIAGNOSIS:  Nuclear sclerotic cataract right eye.     PROCEDURE:  Phacoemusification with posterior chamber intraocular lens placement of the right eye   ULTRASOUND TIME: Procedure(s) with comments: CATARACT EXTRACTION PHACO AND INTRAOCULAR LENS PLACEMENT (IOC) RIGHT DIABETIC 22.93 1.42.1 (Right) - Diabetic  LENS:   Implant Name Type Inv. Item Serial No. Manufacturer Lot No. LRB No. Used Action  LENS IOL TECNIS EYHANCE 24.0 - [id number] Intraocular Lens LENS IOL TECNIS EYHANCE 24.0 [id number] [employer]  Right 1 Implanted         SURGEON:  Deirdre Evener, MD   ANESTHESIA:  Topical with tetracaine drops and 2% Xylocaine jelly, augmented with 1% preservative-free intracameral lidocaine.    COMPLICATIONS:  None.   DESCRIPTION OF PROCEDURE:  The patient was identified in the holding room and transported to the operating room and placed in the supine position under the operating microscope.  The right eye was identified as the operative eye and it was prepped and draped in the usual sterile ophthalmic fashion.   A 1 millimeter clear-corneal paracentesis was made at the 12:00 position.  0.5 ml of preservative-free 1% lidocaine was injected into the anterior chamber. The anterior chamber was filled with Viscoat viscoelastic.  A 2.4 millimeter keratome was used to make a near-clear corneal incision at the 9:00 position.  A curvilinear capsulorrhexis was made with a cystotome and capsulorrhexis forceps.  Balanced salt solution was used to hydrodissect and hydrodelineate the nucleus.   Phacoemulsification was then used in stop and chop fashion to remove the lens nucleus and epinucleus.  The remaining cortex was then removed using the irrigation and aspiration handpiece. Provisc was then placed into the capsular bag to distend it for lens placement.  A lens was then  injected into the capsular bag.  The remaining viscoelastic was aspirated.   Wounds were hydrated with balanced salt solution.  The anterior chamber was inflated to a physiologic pressure with balanced salt solution.  No wound leaks were noted. Cefuroxime 0.1 ml of a 10mg /ml solution was injected into the anterior chamber for a dose of 1 mg of intracameral antibiotic at the completion of the case.   Timolol and Brimonidine drops were applied to the eye.  The patient was taken to the recovery room in stable condition without complications of anesthesia or surgery.   Owynn Mosqueda 08/03/2022, 9:15 AM

## 2022-08-03 NOTE — Anesthesia Postprocedure Evaluation (Signed)
Anesthesia Post Note  Patient: [name]  Procedure(s) Performed: CATARACT EXTRACTION PHACO AND INTRAOCULAR LENS PLACEMENT (IOC) RIGHT DIABETIC 22.93 1.42.1 (Right)  Patient location during evaluation: PACU Anesthesia Type: MAC Level of consciousness: awake and alert Pain management: pain level controlled Vital Signs Assessment: post-procedure vital signs reviewed and stable Respiratory status: spontaneous breathing, nonlabored ventilation, respiratory function stable and patient connected to nasal cannula oxygen Cardiovascular status: stable and blood pressure returned to baseline Postop Assessment: no apparent nausea or vomiting Anesthetic complications: no   No notable events documented.   Last Vitals:  Vitals:   08/03/22 0916 08/03/22 0920  BP: 132/73   Pulse:  70  Resp:  13  Temp: (!) 36.1 C   SpO2:  99%    Last Pain:  Vitals:   08/03/22 0916  TempSrc:   PainSc: 0-No pain                 Marisue Humble

## 2022-08-04 ENCOUNTER — Encounter: Payer: Self-pay | Admitting: Ophthalmology

## 2023-01-19 ENCOUNTER — Other Ambulatory Visit: Payer: Self-pay | Admitting: Cardiovascular Disease

## 2023-04-01 NOTE — Progress Notes (Deleted)
 Cardiology Office Note  Date:  04/01/2023   ID:  LAKYN ALSTEEN, DOB 1933-02-26, MRN [id number]  PCP:  [name], MD   No chief complaint on file.   HPI:  [name] is a 89 year old woman with past medical history of hyperlipidemia,  hypertension,  type 2 diabetes Who presents for evaluation of her chest pain, cardiac risk factors  Last seen in clinic 8/23 In follow-up today reports that she feels well, no significant complaints Trying to stay active, chronic knee issues Family does not want her to have any surgery  Denies significant chest pain on exertion A1c relatively well controlled 6.6 range Cholesterol 150 Total eating statin with Zetia   Reports blood pressure well controlled  EKG personally reviewed by myself on todays visit Normal sinus rhythm rate 74 bpm no significant ST-T wave changes  Other past medical history reviewed CT coronary calcium scoring February 2021 Score of 256  Abdominal CT February 2021,  Concern for early interstitial lung disease, aortic atherosclerosis  Other past medical history reviewed Carotid ultrasound February 2015 No significant disease   PMH:   has a past medical history of Adenomatous polyps, Arthritis, Essential hypertension, benign, Insomnia, Menopausal syndrome, Other and unspecified hyperlipidemia, Type II or unspecified type diabetes mellitus without mention of complication, not stated as uncontrolled, and Wears hearing aid in both ears.  PSH:    Past Surgical History:  Procedure Laterality Date   CATARACT EXTRACTION W/PHACO Right 08/03/2022   Procedure: CATARACT EXTRACTION PHACO AND INTRAOCULAR LENS PLACEMENT (IOC) RIGHT DIABETIC 22.93 1.42.1;  Surgeon: Mittie Gaskin, MD;  [facility];  Service: Ophthalmology;  Laterality: Right;  Diabetic   MENISCUS REPAIR      Current Outpatient Medications  Medication Sig Dispense Refill   aspirin 81 MG EC tablet Take 81 mg by mouth daily.      Beclomethasone Dipropionate 80 MCG/ACT AERS Place into the nose as needed.      benazepril  (LOTENSIN ) 20 MG tablet Take 30 mg by mouth daily.     ezetimibe  (ZETIA ) 10 MG tablet TAKE 1 TABLET BY MOUTH DAILY 90 tablet 0   fexofenadine (ALLEGRA) 180 MG tablet Take 180 mg by mouth at bedtime.     meloxicam (MOBIC) 7.5 MG tablet Take 15 mg by mouth daily.     metFORMIN (GLUCOPHAGE) 500 MG tablet Take 500 mg by mouth 2 (two) times daily.     Multiple Vitamin (MULTI-VITAMIN) tablet Take 1 tablet by mouth daily.      mupirocin  ointment (BACTROBAN ) 2 % Apply 1 Application topically daily. And cover 22 g 0   PARoxetine  (PAXIL -CR) 12.5 MG 24 hr tablet Take 20 mg by mouth every morning.     simvastatin  (ZOCOR ) 40 MG tablet Take 40 mg by mouth at bedtime.     No current facility-administered medications for this visit.    Allergies:   Tetanus toxoids   Social History:  The patient  reports that she has never smoked. She has never used smokeless tobacco. She reports current alcohol use of about 7.0 standard drinks of alcohol per week. She reports that she does not use drugs.   Family History:   family history includes Heart disease in her mother; Hyperlipidemia in her mother; Hypertension in her mother.    Review of Systems: Review of Systems  Constitutional: Negative.   HENT: Negative.    Respiratory: Negative.    Cardiovascular: Negative.        Occasional mild chest pressure on  the left at rest  Gastrointestinal: Negative.   Musculoskeletal: Negative.   Neurological: Negative.   Psychiatric/Behavioral: Negative.    All other systems reviewed and are negative.   PHYSICAL EXAM: VS:  There were no vitals taken for this visit. , BMI There is no height or weight on file to calculate BMI. Constitutional:  oriented to person, place, and time. No distress.  HENT:  Head: Grossly normal Eyes:  no discharge. No scleral icterus.  Neck: No JVD, no carotid bruits  Cardiovascular: Regular rate and  rhythm, no murmurs appreciated Pulmonary/Chest: Clear to auscultation bilaterally, no wheezes or rails Abdominal: Soft.  no distension.  no tenderness.  Musculoskeletal: Normal range of motion Neurological:  normal muscle tone. Coordination normal. No atrophy Skin: Skin warm and dry Psychiatric: normal affect, pleasant  Recent Labs: No results found for requested labs within last 365 days.    Lipid Panel No results found for: CHOL, HDL, LDLCALC, TRIG    Wt Readings from Last 3 Encounters:  08/03/22 129 lb 1.6 oz (58.6 kg)  03/23/22 131 lb (59.4 kg)  11/09/21 130 lb 6 oz (59.1 kg)     ASSESSMENT AND PLAN:  Problem List Items Addressed This Visit   None   Left chest discomfort No recent chest pain symptoms concerning for angina Low calcium scoring No further work-up at this time Cholesterol at goal, diabetes well controlled, non-smoker  Hypertension Blood pressure is well controlled on today's visit. No changes made to the medications.  Type 2 diabetes without complications A1c well controlled, weight stable  Hyperlipidemia Cholesterol is at goal on the current lipid regimen. No changes to the medications were made.   Total encounter time more than 30 minutes  Greater than 50% was spent in counseling and coordination of care with the patient    Signed, Velinda Lunger, M.D., Ph.D. [facility] [place], [place] [phone]

## 2023-04-03 ENCOUNTER — Ambulatory Visit: Payer: Medicare Other | Admitting: Cardiovascular Disease

## 2023-04-03 DIAGNOSIS — I10 Essential (primary) hypertension: Secondary | ICD-10-CM

## 2023-04-03 DIAGNOSIS — E1159 Type 2 diabetes mellitus with other circulatory complications: Secondary | ICD-10-CM

## 2023-04-03 DIAGNOSIS — E782 Mixed hyperlipidemia: Secondary | ICD-10-CM

## 2023-04-03 DIAGNOSIS — R0789 Other chest pain: Secondary | ICD-10-CM

## 2023-04-03 DIAGNOSIS — E119 Type 2 diabetes mellitus without complications: Secondary | ICD-10-CM

## 2023-04-03 DIAGNOSIS — I2510 Atherosclerotic heart disease of native coronary artery without angina pectoris: Secondary | ICD-10-CM

## 2023-04-21 ENCOUNTER — Other Ambulatory Visit: Payer: Self-pay | Admitting: Cardiovascular Disease

## 2023-04-25 ENCOUNTER — Ambulatory Visit: Payer: Medicare Other | Admitting: Dermatology

## 2023-05-15 ENCOUNTER — Other Ambulatory Visit: Payer: Self-pay | Admitting: Cardiovascular Disease

## 2023-05-15 NOTE — Telephone Encounter (Signed)
Hi,  Will you please outreach patient to schedule OD follow up. LOV 10/2021.  Thank you,  Ferne Coe

## 2023-05-15 NOTE — Telephone Encounter (Signed)
Pt has appt. Please disregard my last message.  Thanks,

## 2023-05-16 ENCOUNTER — Encounter: Payer: Self-pay | Admitting: Dermatology

## 2023-05-16 ENCOUNTER — Ambulatory Visit (INDEPENDENT_AMBULATORY_CARE_PROVIDER_SITE_OTHER): Payer: Medicare Other | Admitting: Dermatology

## 2023-05-16 DIAGNOSIS — L814 Other melanin hyperpigmentation: Secondary | ICD-10-CM

## 2023-05-16 DIAGNOSIS — L821 Other seborrheic keratosis: Secondary | ICD-10-CM

## 2023-05-16 DIAGNOSIS — D229 Melanocytic nevi, unspecified: Secondary | ICD-10-CM

## 2023-05-16 DIAGNOSIS — Z1283 Encounter for screening for malignant neoplasm of skin: Secondary | ICD-10-CM | POA: Diagnosis not present

## 2023-05-16 DIAGNOSIS — W908XXA Exposure to other nonionizing radiation, initial encounter: Secondary | ICD-10-CM | POA: Diagnosis not present

## 2023-05-16 DIAGNOSIS — L578 Other skin changes due to chronic exposure to nonionizing radiation: Secondary | ICD-10-CM | POA: Diagnosis not present

## 2023-05-16 DIAGNOSIS — D1801 Hemangioma of skin and subcutaneous tissue: Secondary | ICD-10-CM

## 2023-05-16 DIAGNOSIS — D692 Other nonthrombocytopenic purpura: Secondary | ICD-10-CM

## 2023-05-16 NOTE — Patient Instructions (Addendum)

## 2023-05-16 NOTE — Progress Notes (Signed)
   Follow-Up Visit   Subjective  [name] is a 89 y.o. female who presents for the following: Skin Cancer Screening and Full Body Skin Exam  The patient presents for Total-Body Skin Exam (TBSE) for skin cancer screening and mole check. The patient has spots, moles and lesions to be evaluated, some may be new or changing and the patient may have concern these could be cancer.    The following portions of the chart were reviewed this encounter and updated as appropriate: medications, allergies, medical history  Review of Systems:  No other skin or systemic complaints except as noted in HPI or Assessment and Plan.  Objective  Well appearing patient in no apparent distress; mood and affect are within normal limits.  A full examination was performed including scalp, head, eyes, ears, nose, lips, neck, chest, axillae, abdomen, back, buttocks, bilateral upper extremities, bilateral lower extremities, hands, feet, fingers, toes, fingernails, and toenails. All findings within normal limits unless otherwise noted below.    Relevant physical exam findings are noted in the Assessment and Plan.    Assessment & Plan   SKIN CANCER SCREENING PERFORMED TODAY.  ACTINIC DAMAGE - Chronic condition, secondary to cumulative UV/sun exposure - diffuse scaly erythematous macules with underlying dyspigmentation - Recommend daily broad spectrum sunscreen SPF 30+ to sun-exposed areas, reapply every 2 hours as needed.  - Staying in the shade or wearing long sleeves, sun glasses (UVA+UVB protection) and wide brim hats (4-inch brim around the entire circumference of the hat) are also recommended for sun protection.  - Call for new or changing lesions.  LENTIGINES, SEBORRHEIC KERATOSES, HEMANGIOMAS - Benign normal skin lesions - Benign-appearing - Call for any changes  MELANOCYTIC NEVI - Tan-brown and/or pink-flesh-colored symmetric macules and papules - Benign appearing on exam today -  Observation - Call clinic for new or changing moles - Recommend daily use of broad spectrum spf 30+ sunscreen to sun-exposed areas.    Purpura - Chronic; persistent and recurrent.  Treatable, but not curable. - Violaceous macules and patches - Benign - Related to trauma, age, sun damage and/or use of blood thinners, chronic use of topical and/or oral steroids - Observe - Can use OTC arnica containing moisturizer such as Dermend Bruise Formula if desired - Call for worsening or other concerns      MULTIPLE BENIGN NEVI   LENTIGINES   ACTINIC ELASTOSIS   CHERRY ANGIOMA   SEBORRHEIC KERATOSES   SOLAR PURPURA (HCC)   Return in about 2 years (around 05/15/2025) for TBSE, w/ Dr. Katrinka Blazing.  Wynonia Lawman, CMA, am acting as scribe for Elie Goody, MD .   Documentation: I have reviewed the above documentation for accuracy and completeness, and I agree with the above.  Elie Goody, MD

## 2023-05-29 NOTE — Progress Notes (Unsigned)
 Cardiology Office Note  Date:  05/30/2023   ID:  [name], DOB October 07, 1932, MRN [id number]  PCP:  Lynnea Ferrier, MD   Chief Complaint  Patient presents with   12 month follow up     "Doing well."     HPI:  [name] is a 89 year old woman with past medical history of hyperlipidemia,  hypertension,  type 2 diabetes Who presents for evaluation of her chest pain, cardiac risk factors  Last seen in clinic 8/23  In follow-up today reports that she feels well Spends her time reading, does exercise Activity limited by Chronic knee pain It was recommended by family that she did not have knee replacement surgery at her age in effort to avoid complications  Denies significant shortness of breath or chest pain on exertion Blood pressure well-controlled  A1c  6.5, stable Cholesterol 151 Tolerating statin with Zetia  EKG personally reviewed by myself on todays visit EKG Interpretation Date/Time:  Tuesday May 30 2023 10:47:47 EST Ventricular Rate:  65 PR Interval:  148 QRS Duration:  68 QT Interval:  388 QTC Calculation: 403 R Axis:   19  Text Interpretation: Normal sinus rhythm When compared with ECG of 23-Jul-2009 14:29, No significant change was found Confirmed by Julien Nordmann [phone]) on 05/30/2023 11:07:42 AM   Other past medical history reviewed CT coronary calcium scoring February 2021 Score of 256  Abdominal CT February 2021,  Concern for early interstitial lung disease, aortic atherosclerosis  Other past medical history reviewed Carotid ultrasound February 2015 No significant disease   PMH:   has a past medical history of Adenomatous polyps, Arthritis, Essential hypertension, benign, Insomnia, Menopausal syndrome, Other and unspecified hyperlipidemia, Type II or unspecified type diabetes mellitus without mention of complication, not stated as uncontrolled, and Wears hearing aid in both ears.  PSH:    Past Surgical History:  Procedure Laterality  Date   CATARACT EXTRACTION W/PHACO Right 08/03/2022   Procedure: CATARACT EXTRACTION PHACO AND INTRAOCULAR LENS PLACEMENT (IOC) RIGHT DIABETIC 22.93 1.42.1;  Surgeon: Lockie Mola, MD;  Location: [facility];  Service: Ophthalmology;  Laterality: Right;  Diabetic   MENISCUS REPAIR      Current Outpatient Medications  Medication Sig Dispense Refill   aspirin 81 MG EC tablet Take 81 mg by mouth daily.     Beclomethasone Dipropionate 80 MCG/ACT AERS Place into the nose as needed.      benazepril (LOTENSIN) 20 MG tablet Take 30 mg by mouth daily.     ezetimibe (ZETIA) 10 MG tablet TAKE 1 TABLET BY MOUTH DAILY 30 tablet 0   fexofenadine (ALLEGRA) 180 MG tablet Take 180 mg by mouth at bedtime.     meloxicam (MOBIC) 7.5 MG tablet Take 15 mg by mouth daily.     metFORMIN (GLUCOPHAGE) 500 MG tablet Take 500 mg by mouth 2 (two) times daily.     Multiple Vitamin (MULTI-VITAMIN) tablet Take 1 tablet by mouth daily.      mupirocin ointment (BACTROBAN) 2 % Apply 1 Application topically daily. And cover 22 g 0   PARoxetine (PAXIL-CR) 12.5 MG 24 hr tablet Take 20 mg by mouth every morning.     simvastatin (ZOCOR) 40 MG tablet Take 40 mg by mouth at bedtime.     No current facility-administered medications for this visit.    Allergies:   Tetanus antitoxin and Tetanus toxoids   Social History:  The patient  reports that she has never smoked. She has never used smokeless  tobacco. She reports current alcohol use of about 7.0 standard drinks of alcohol per week. She reports that she does not use drugs.   Family History:   family history includes Heart disease in her mother; Hyperlipidemia in her mother; Hypertension in her mother.    Review of Systems: Review of Systems  Constitutional: Negative.   HENT: Negative.    Respiratory: Negative.    Cardiovascular: Negative.   Gastrointestinal: Negative.   Musculoskeletal: Negative.   Neurological: Negative.   Psychiatric/Behavioral:  Negative.    All other systems reviewed and are negative.   PHYSICAL EXAM: VS:  BP (!) 140/80 (BP Location: Left Arm, Patient Position: Sitting, Cuff Size: Normal)   Pulse 65   Ht 5' 5.5" (1.664 m)   Wt 130 lb 4 oz (59.1 kg)   SpO2 98%   BMI 21.35 kg/m  , BMI Body mass index is 21.35 kg/m. Constitutional:  oriented to person, place, and time. No distress.  HENT:  Head: Grossly normal Eyes:  no discharge. No scleral icterus.  Neck: No JVD, no carotid bruits  Cardiovascular: Regular rate and rhythm, no murmurs appreciated Pulmonary/Chest: Clear to auscultation bilaterally, no wheezes or rails Abdominal: Soft.  no distension.  no tenderness.  Musculoskeletal: Normal range of motion Neurological:  normal muscle tone. Coordination normal. No atrophy Skin: Skin warm and dry Psychiatric: normal affect, pleasant  Recent Labs: No results found for requested labs within last 365 days.    Lipid Panel No results found for: "CHOL", "HDL", "LDLCALC", "TRIG"    Wt Readings from Last 3 Encounters:  05/30/23 130 lb 4 oz (59.1 kg)  08/03/22 129 lb 1.6 oz (58.6 kg)  03/23/22 131 lb (59.4 kg)     ASSESSMENT AND PLAN:  Problem List Items Addressed This Visit       Cardiology Problems   Aortic atherosclerosis (HCC)   Relevant Orders   EKG 12-Lead (Completed)   Hyperlipidemia     Other   ILD (interstitial lung disease) (HCC)   Chest discomfort   Relevant Orders   EKG 12-Lead (Completed)   Other Visit Diagnoses       Controlled type 2 diabetes mellitus with other circulatory complication, without long-term current use of insulin (HCC)    -  Primary   Relevant Orders   EKG 12-Lead (Completed)     Coronary artery calcification       Relevant Orders   EKG 12-Lead (Completed)     Essential hypertension       Relevant Orders   EKG 12-Lead (Completed)      Left chest discomfort Denies significant chest pain concerning for angina Low calcium scoring No further ischemic  workup needed Cholesterol at goal, diabetes well controlled, non-smoker  Hypertension Blood pressure is well controlled on today's visit. No changes made to the medications.  Type 2 diabetes without complications A1c well controlled in the 6 range,  Will defer to primary care  Hyperlipidemia Cholesterol is at goal on the current lipid regimen. No changes to the medications were made.    Signed, Dossie Arbour, M.D., Ph.D. [facility] [place], [place] [phone]

## 2023-05-30 ENCOUNTER — Encounter: Payer: Self-pay | Admitting: Cardiovascular Disease

## 2023-05-30 ENCOUNTER — Ambulatory Visit: Payer: Medicare Other | Attending: Cardiovascular Disease | Admitting: Cardiovascular Disease

## 2023-05-30 VITALS — BP 140/80 | HR 65 | Ht 65.5 in | Wt 130.2 lb

## 2023-05-30 DIAGNOSIS — I2510 Atherosclerotic heart disease of native coronary artery without angina pectoris: Secondary | ICD-10-CM | POA: Diagnosis not present

## 2023-05-30 DIAGNOSIS — I10 Essential (primary) hypertension: Secondary | ICD-10-CM | POA: Diagnosis present

## 2023-05-30 DIAGNOSIS — E1159 Type 2 diabetes mellitus with other circulatory complications: Secondary | ICD-10-CM | POA: Diagnosis present

## 2023-05-30 DIAGNOSIS — R0789 Other chest pain: Secondary | ICD-10-CM | POA: Diagnosis present

## 2023-05-30 DIAGNOSIS — E782 Mixed hyperlipidemia: Secondary | ICD-10-CM | POA: Diagnosis present

## 2023-05-30 DIAGNOSIS — J849 Interstitial pulmonary disease, unspecified: Secondary | ICD-10-CM | POA: Diagnosis present

## 2023-05-30 DIAGNOSIS — I700 Atherosclerosis of aorta: Secondary | ICD-10-CM | POA: Insufficient documentation

## 2023-05-30 MED ORDER — EZETIMIBE 10 MG PO TABS: 10.0000 mg | ORAL_TABLET | Freq: Every day | ORAL | 3 refills | Status: AC

## 2023-05-30 NOTE — Patient Instructions (Signed)

## 2023-10-18 ENCOUNTER — Emergency Department

## 2023-10-18 ENCOUNTER — Other Ambulatory Visit: Payer: Self-pay

## 2023-10-18 ENCOUNTER — Encounter: Payer: Self-pay | Admitting: Emergency Medicine

## 2023-10-18 ENCOUNTER — Observation Stay
Admission: EM | Admit: 2023-10-18 | Discharge: 2023-10-19 | Disposition: A | Discharge status: Home / Self Care | Attending: Internal Medicine | Admitting: Internal Medicine

## 2023-10-18 DIAGNOSIS — J849 Interstitial pulmonary disease, unspecified: Secondary | ICD-10-CM | POA: Diagnosis not present

## 2023-10-18 DIAGNOSIS — E875 Hyperkalemia: Secondary | ICD-10-CM | POA: Diagnosis not present

## 2023-10-18 DIAGNOSIS — R2681 Unsteadiness on feet: Secondary | ICD-10-CM | POA: Diagnosis not present

## 2023-10-18 DIAGNOSIS — I129 Hypertensive chronic kidney disease with stage 1 through stage 4 chronic kidney disease, or unspecified chronic kidney disease: Secondary | ICD-10-CM | POA: Insufficient documentation

## 2023-10-18 DIAGNOSIS — N1831 Chronic kidney disease, stage 3a: Secondary | ICD-10-CM | POA: Diagnosis not present

## 2023-10-18 DIAGNOSIS — E785 Hyperlipidemia, unspecified: Secondary | ICD-10-CM | POA: Diagnosis not present

## 2023-10-18 DIAGNOSIS — Z7982 Long term (current) use of aspirin: Secondary | ICD-10-CM | POA: Insufficient documentation

## 2023-10-18 DIAGNOSIS — N3000 Acute cystitis without hematuria: Secondary | ICD-10-CM | POA: Diagnosis not present

## 2023-10-18 DIAGNOSIS — R531 Weakness: Secondary | ICD-10-CM | POA: Diagnosis present

## 2023-10-18 DIAGNOSIS — E1122 Type 2 diabetes mellitus with diabetic chronic kidney disease: Secondary | ICD-10-CM | POA: Insufficient documentation

## 2023-10-18 DIAGNOSIS — Z79899 Other long term (current) drug therapy: Secondary | ICD-10-CM | POA: Insufficient documentation

## 2023-10-18 DIAGNOSIS — E1129 Type 2 diabetes mellitus with other diabetic kidney complication: Secondary | ICD-10-CM | POA: Diagnosis present

## 2023-10-18 DIAGNOSIS — N390 Urinary tract infection, site not specified: Secondary | ICD-10-CM | POA: Diagnosis present

## 2023-10-18 DIAGNOSIS — F32A Depression, unspecified: Secondary | ICD-10-CM | POA: Diagnosis not present

## 2023-10-18 DIAGNOSIS — I10 Essential (primary) hypertension: Secondary | ICD-10-CM | POA: Diagnosis not present

## 2023-10-18 DIAGNOSIS — Z7984 Long term (current) use of oral hypoglycemic drugs: Secondary | ICD-10-CM | POA: Insufficient documentation

## 2023-10-18 LAB — URINALYSIS, ROUTINE W REFLEX MICROSCOPIC
Bilirubin Urine: NEGATIVE
Glucose, UA: NEGATIVE mg/dL
Hgb urine dipstick: NEGATIVE
Ketones, ur: NEGATIVE mg/dL
Nitrite: NEGATIVE
Protein, ur: NEGATIVE mg/dL
Specific Gravity, Urine: 1.024 (ref 1.005–1.030)
pH: 5 (ref 5.0–8.0)

## 2023-10-18 LAB — CBC WITH DIFFERENTIAL/PLATELET
Abs Immature Granulocytes: 0.03 K/uL (ref 0.00–0.07)
Basophils Absolute: 0 K/uL (ref 0.0–0.1)
Basophils Relative: 0 %
Eosinophils Absolute: 0.3 K/uL (ref 0.0–0.5)
Eosinophils Relative: 3 %
HCT: 32.7 % — ABNORMAL LOW (ref 36.0–46.0)
Hemoglobin: 10.6 g/dL — ABNORMAL LOW (ref 12.0–15.0)
Immature Granulocytes: 0 %
Lymphocytes Relative: 16 %
Lymphs Abs: 1.6 K/uL (ref 0.7–4.0)
MCH: 30.4 pg (ref 26.0–34.0)
MCHC: 32.4 g/dL (ref 30.0–36.0)
MCV: 93.7 fL (ref 80.0–100.0)
Monocytes Absolute: 1.3 K/uL — ABNORMAL HIGH (ref 0.1–1.0)
Monocytes Relative: 13 %
Neutro Abs: 6.9 K/uL (ref 1.7–7.7)
Neutrophils Relative %: 68 %
Platelets: 298 K/uL (ref 150–400)
RBC: 3.49 MIL/uL — ABNORMAL LOW (ref 3.87–5.11)
RDW: 13.2 % (ref 11.5–15.5)
WBC: 10.3 K/uL (ref 4.0–10.5)
nRBC: 0 % (ref 0.0–0.2)

## 2023-10-18 LAB — TROPONIN I (HIGH SENSITIVITY)
Troponin I (High Sensitivity): 5 ng/L (ref <18)
Troponin I (High Sensitivity): 5 ng/L (ref <18)

## 2023-10-18 LAB — COMPREHENSIVE METABOLIC PANEL WITH GFR
ALT: 14 U/L (ref 0–44)
AST: 24 U/L (ref 15–41)
Albumin: 3.2 g/dL — ABNORMAL LOW (ref 3.5–5.0)
Alkaline Phosphatase: 58 U/L (ref 38–126)
Anion gap: 11 (ref 5–15)
BUN: 47 mg/dL — ABNORMAL HIGH (ref 8–23)
CO2: 21 mmol/L — ABNORMAL LOW (ref 22–32)
Calcium: 9 mg/dL (ref 8.9–10.3)
Chloride: 103 mmol/L (ref 98–111)
Creatinine, Ser: 1.17 mg/dL — ABNORMAL HIGH (ref 0.44–1.00)
GFR, Estimated: 45 mL/min — ABNORMAL LOW (ref >60)
Glucose, Bld: 141 mg/dL — ABNORMAL HIGH (ref 70–99)
Potassium: 5.5 mmol/L — ABNORMAL HIGH (ref 3.5–5.1)
Sodium: 135 mmol/L (ref 135–145)
Total Bilirubin: 0.5 mg/dL (ref 0.0–1.2)
Total Protein: 6.4 g/dL — ABNORMAL LOW (ref 6.5–8.1)

## 2023-10-18 MED ORDER — DM-GUAIFENESIN ER 30-600 MG PO TB12: 1.0000 | ORAL_TABLET | Freq: Two times a day (BID) | ORAL | Status: DC | PRN

## 2023-10-18 MED ORDER — ALBUTEROL SULFATE (2.5 MG/3ML) 0.083% IN NEBU: 2.5000 mg | INHALATION_SOLUTION | RESPIRATORY_TRACT | Status: DC | PRN

## 2023-10-18 MED ORDER — ENOXAPARIN SODIUM 40 MG/0.4ML IJ SOSY
40.0000 mg | PREFILLED_SYRINGE | Freq: Every day | INTRAMUSCULAR | Status: DC
  Administered 2023-10-19: 40 mg via SUBCUTANEOUS
  Filled 2023-10-18: qty 0.4

## 2023-10-18 MED ORDER — SODIUM CHLORIDE 0.9 % IV BOLUS
1000.0000 mL | Freq: Once | INTRAVENOUS | Status: AC
  Administered 2023-10-18: 1000 mL via INTRAVENOUS

## 2023-10-18 MED ORDER — ACETAMINOPHEN 325 MG PO TABS: 650.0000 mg | ORAL_TABLET | Freq: Four times a day (QID) | ORAL | Status: DC | PRN

## 2023-10-18 MED ORDER — SODIUM CHLORIDE 0.9 % IV SOLN
1.0000 g | INTRAVENOUS | Status: DC
  Administered 2023-10-19: 1 g via INTRAVENOUS
  Filled 2023-10-18: qty 10

## 2023-10-18 MED ORDER — ONDANSETRON HCL 4 MG/2ML IJ SOLN: 4.0000 mg | Freq: Three times a day (TID) | INTRAMUSCULAR | Status: DC | PRN

## 2023-10-18 MED ORDER — INSULIN ASPART 100 UNIT/ML IJ SOLN: 0.0000 [IU] | Freq: Every day | INTRAMUSCULAR | Status: DC

## 2023-10-18 MED ORDER — INSULIN ASPART 100 UNIT/ML IJ SOLN: 0.0000 [IU] | Freq: Three times a day (TID) | INTRAMUSCULAR | Status: DC

## 2023-10-18 MED ORDER — SODIUM ZIRCONIUM CYCLOSILICATE 5 G PO PACK
5.0000 g | PACK | Freq: Once | ORAL | Status: AC
  Administered 2023-10-19: 5 g via ORAL
  Filled 2023-10-18: qty 1

## 2023-10-18 MED ORDER — SODIUM CHLORIDE 0.9 % IV SOLN
1.0000 g | Freq: Once | INTRAVENOUS | Status: AC
  Administered 2023-10-18: 1 g via INTRAVENOUS
  Filled 2023-10-18: qty 10

## 2023-10-18 MED ORDER — HYDRALAZINE HCL 20 MG/ML IJ SOLN: 5.0000 mg | INTRAMUSCULAR | Status: DC | PRN

## 2023-10-18 NOTE — ED Notes (Signed)
 Pt placed on bedpan, scant amount of urine collected and sent to lab. Pt placed on purwick at this time and attached to suction per policy.

## 2023-10-18 NOTE — ED Provider Notes (Signed)
 [facility] Provider Note    Event Date/Time   First MD Initiated Contact with Patient 10/18/23 2019     (approximate)   History   Chief Complaint Weakness   HPI  [name] is a 89 y.o. female with past medical history of hypertension, hyperlipidemia, diabetes, and interstitial lung disease who presents to the ED complaining of weakness.  Patient reports that she woke up this morning feeling weak in general after feeling fine when she went to bed last night.  She denies any focal weakness and has not had any vision changes or speech changes.  She went to see her PCP for this problem today with unremarkable workup at that time, but weakness seem to get acutely worse before she went to bed this evening.  Spouse states that she was unable to even walk to get into bed, which is very unusual for her.  She denies any fevers, cough, chest pain, shortness of breath, nausea, vomiting, diarrhea, or dysuria.     Physical Exam   Triage Vital Signs: ED Triage Vitals [10/18/23 2023]  Encounter Vitals Group     BP      Girls Systolic BP Percentile      Girls Diastolic BP Percentile      Boys Systolic BP Percentile      Boys Diastolic BP Percentile      Pulse      Resp      Temp      Temp src      SpO2      Weight 136 lb 14.4 oz (62.1 kg)     Height      Head Circumference      Peak Flow      Pain Score      Pain Loc      Pain Education      Exclude from Growth Chart     Most recent vital signs: Vitals:   10/18/23 2029 10/18/23 2150  BP: (!) 144/70 (!) 143/65  Pulse: 88 92  Resp: 17 20  Temp:    SpO2: 100% 98%    Constitutional: Alert and oriented. Eyes: Conjunctivae are normal. Head: Atraumatic. Nose: No congestion/rhinnorhea. Mouth/Throat: Mucous membranes are moist.  Cardiovascular: Normal rate, regular rhythm. Grossly normal heart sounds.  2+ radial pulses bilaterally. Respiratory: Normal respiratory effort.  No retractions. Lungs  CTAB. Gastrointestinal: Soft and nontender. No distention. Musculoskeletal: No lower extremity tenderness nor edema.  Neurologic: Subtle left-sided facial droop, otherwise nonfocal neurologic exam.  Normal speech and language.    ED Results / Procedures / Treatments   Labs (all labs ordered are listed, but only abnormal results are displayed) Labs Reviewed  CBC WITH DIFFERENTIAL/PLATELET - Abnormal; Notable for the following components:      Result Value   RBC 3.49 (*)    Hemoglobin 10.6 (*)    HCT 32.7 (*)    Monocytes Absolute 1.3 (*)    All other components within normal limits  COMPREHENSIVE METABOLIC PANEL WITH GFR - Abnormal; Notable for the following components:   Potassium 5.5 (*)    CO2 21 (*)    Glucose, Bld 141 (*)    BUN 47 (*)    Creatinine, Ser 1.17 (*)    Total Protein 6.4 (*)    Albumin 3.2 (*)    GFR, Estimated 45 (*)    All other components within normal limits  URINALYSIS, ROUTINE W REFLEX MICROSCOPIC - Abnormal; Notable for the following components:  Color, Urine YELLOW (*)    APPearance HAZY (*)    Leukocytes,Ua SMALL (*)    Bacteria, UA MANY (*)    All other components within normal limits  URINE CULTURE  TROPONIN I (HIGH SENSITIVITY)  TROPONIN I (HIGH SENSITIVITY)     EKG  ED ECG REPORT I, Carlin Palin, the attending physician, personally viewed and interpreted this ECG.   Date: 10/18/2023  EKG Time: 20:23  Rate: 92  Rhythm: normal sinus rhythm  Axis: Normal  Intervals:none  ST&T Change: None  RADIOLOGY CT head reviewed and interpreted by me with no hemorrhage or midline shift.  PROCEDURES:  Critical Care performed: No  Procedures   MEDICATIONS ORDERED IN ED: Medications  sodium chloride  0.9 % bolus 1,000 mL (1,000 mLs Intravenous New Bag/Given 10/18/23 2155)  cefTRIAXone  (ROCEPHIN ) 1 g in sodium chloride  0.9 % 100 mL IVPB (1 g Intravenous New Bag/Given 10/18/23 2226)     IMPRESSION / MDM / ASSESSMENT AND PLAN / ED COURSE   I reviewed the triage vital signs and the nursing notes.                              89 y.o. female with past medical history of hypertension, hyperlipidemia, diabetes, and interstitial lung disease who presents to the ED complaining of acute onset generalized weakness after waking up this morning.  Patient's presentation is most consistent with acute presentation with potential threat to life or bodily function.  Differential diagnosis includes, but is not limited to, stroke, TIA, ACS, arrhythmia, anemia, electrolyte abnormality, AKI, UTI, pneumonia.  Patient nontoxic-appearing and in no acute distress, vital signs are unremarkable.  She does seem to have a subtle left-sided facial droop but last known well time was last night and she is outside the window for acute intervention if in fact she has had a stroke.  No findings concerning for LVO, will further assess with CT head and MRI brain.  EKG shows no evidence of arrhythmia or ischemia, will screen troponin as well as CBC and CMP.  Urinalysis and chest x-ray are also pending.  CT head is negative for acute process, MR brain is also negative for acute finding.  Labs without significant anemia, leukocytosis, electrolyte abnormality, or AKI.  Chest x-ray unremarkable, urinalysis does appear concerning for UTI and we will send for culture, treat with IV Rocephin .  Given her generalized weakness and difficulty walking, case discussed with hospitalist for admission.      FINAL CLINICAL IMPRESSION(S) / ED DIAGNOSES   Final diagnoses:  Generalized weakness  Acute cystitis without hematuria     Rx / DC Orders   ED Discharge Orders     None        Note:  This document was prepared using Dragon voice recognition software and may include unintentional dictation errors.   Palin Carlin, MD 10/18/23 [phone]

## 2023-10-18 NOTE — H&P (Signed)
 History and Physical    [name] FMW:[id number] DOB: 07/31/1932 DOA: 10/18/2023  Referring MD/NP/PA:   PCP: Gina Ophelia JINNY DOUGLAS, MD   Patient coming from:  The patient is coming from home.     Chief Complaint: Generalized weakness  HPI: [name] is a 89 y.o. female with medical history significant of HTN, HLD, DM, depression, CKD stage III, ILD, hearing loss with hearing aid, insomnia, who presents with generalized weakness.  Per patient and her husband at bedside, patient has generalized weakness in the past 2 days.  No fall.  No unilateral numbness or tingling in extremities.  No facial droop or slurred speech. She went to see her PCP for this problem today with unremarkable workup at that time, but weakness seem to get acutely worse before she went to bed this evening.  Patient feels so weak that she is unable to walk to get into the bed which is very unusual for her. Patient does not have chest pain, cough, SOB.  No nausea, vomiting, diarrhea or abdominal pain.  Denies symptoms of UTI.  No fever or chills.  Data reviewed independently and ED Course: pt was found to have UA (hazy appearance, small amount of leukocyte, many bacteria, WBC 11-20), WBC 10.3, renal function close to baseline, potassium 5.5, troponin 5.  Temperature normal, blood pressure 143/65, heart rate 92, RR 20, oxygen saturation 98% on room air.  Chest x-ray showed chronic interstitial lung disease.  CT of head negative for acute intracranial abnormalities.  MRI of brain negative for acute issues, but showed remote left frontal lobe infarction.  Patient is placed in telemetry bed for observation.   EKG: I have personally reviewed.  Sinus rhythm, QTc 428, low voltage.   Review of Systems:   General: no fevers, chills, no body weight gain, has fatigue HEENT: no blurry vision, sore throat Respiratory: no dyspnea, coughing, wheezing CV: no chest pain, no palpitations GI: no nausea, vomiting, abdominal pain,  diarrhea, constipation GU: no dysuria, burning on urination, increased urinary frequency, hematuria  Ext: no leg edema Neuro: no unilateral weakness, numbness, or tingling, no vision change or hearing loss Skin: Has bruises in lower legs MSK: No muscle spasm, no deformity, no limitation of range of movement in spin Heme: No easy bruising.  Travel history: No recent long distant travel.   Allergy:  Allergies  Allergen Reactions   Tetanus Antitoxin Rash and Shortness Of Breath   Tetanus Toxoids Shortness Of Breath and Rash    Past Medical History:  Diagnosis Date   Adenomatous polyps    on colonoscopy   Arthritis    Essential hypertension, benign    Insomnia    Menopausal syndrome    Other and unspecified hyperlipidemia    Type II or unspecified type diabetes mellitus without mention of complication, not stated as uncontrolled    Wears hearing aid in both ears     Past Surgical History:  Procedure Laterality Date   CATARACT EXTRACTION W/PHACO Right 08/03/2022   Procedure: CATARACT EXTRACTION PHACO AND INTRAOCULAR LENS PLACEMENT (IOC) RIGHT DIABETIC 22.93 1.42.1;  Surgeon: Mittie Gaskin, MD;  Location: [facility];  Service: Ophthalmology;  Laterality: Right;  Diabetic   MENISCUS REPAIR      Social History:  reports that she has never smoked. She has never used smokeless tobacco. She reports current alcohol use of about 7.0 standard drinks of alcohol per week. She reports that she does not use drugs.  Family History:  Family History  Problem Relation Age of Onset   Heart disease Mother    Hyperlipidemia Mother    Hypertension Mother    Breast cancer Neg Hx      Prior to Admission medications   Medication Sig Start Date End Date Taking? Authorizing Provider  aspirin 81 MG EC tablet Take 81 mg by mouth daily.    [provider]  Beclomethasone Dipropionate 80 MCG/ACT AERS Place into the nose as needed.  04/14/15   [provider]   benazepril  (LOTENSIN ) 20 MG tablet Take 30 mg by mouth daily. 07/16/19   [provider]  ezetimibe  (ZETIA ) 10 MG tablet Take 1 tablet (10 mg total) by mouth daily. 05/30/23   Gollan, Timothy J, MD  fexofenadine (ALLEGRA) 180 MG tablet Take 180 mg by mouth at bedtime.    [provider]  meloxicam (MOBIC) 7.5 MG tablet Take 15 mg by mouth daily.    [provider]  metFORMIN (GLUCOPHAGE) 500 MG tablet Take 500 mg by mouth 2 (two) times daily. 11/05/21   [provider]  Multiple Vitamin (MULTI-VITAMIN) tablet Take 1 tablet by mouth daily.     [provider]  mupirocin  ointment (BACTROBAN ) 2 % Apply 1 Application topically daily. And cover 03/17/22   Hester Alm BROCKS, MD  PARoxetine (PAXIL-CR) 12.5 MG 24 hr tablet Take 20 mg by mouth every morning.    [provider]  simvastatin (ZOCOR) 40 MG tablet Take 40 mg by mouth at bedtime.    [provider]    Physical Exam: Vitals:   10/18/23 2027 10/18/23 2029 10/18/23 2150 10/19/23 0000  BP: (!) 149/60 (!) 144/70 (!) 143/65 (!) 143/72  Pulse: 90 88 92 76  Resp: 18 17 20 18   Temp: 98.2 F (36.8 C)   98.3 F (36.8 C)  TempSrc: Oral   Oral  SpO2: 100% 100% 98% 100%  Weight:       General: Not in acute distress HEENT:       Eyes: PERRL, EOMI, no jaundice       ENT: No discharge from the ears and nose, no pharynx injection, no tonsillar enlargement.        Neck: No JVD, no bruit, no mass felt. Heme: No neck lymph node enlargement. Cardiac: S1/S2, RRR, No murmurs, No gallops or rubs. Respiratory: No rales, wheezing, rhonchi or rubs. GI: Soft, nondistended, nontender, no rebound pain, no organomegaly, BS present. GU: No hematuria Ext: No pitting leg edema bilaterally. 1+DP/PT pulse bilaterally. Musculoskeletal: No joint deformities, No joint redness or warmth, no limitation of ROM in spin. Skin: Has bruises in both lower legs Neuro: Alert, oriented X3, cranial nerves II-XII  grossly intact except for hearing loss, moves all extremities normally. Psych: Patient is not psychotic, no suicidal or hemocidal ideation.  Labs on Admission: I have personally reviewed following labs and imaging studies  CBC: Recent Labs  Lab 10/18/23 2030  WBC 10.3  NEUTROABS 6.9  HGB 10.6*  HCT 32.7*  MCV 93.7  PLT 298   Basic Metabolic Panel: Recent Labs  Lab 10/18/23 2030  NA 135  K 5.5*  CL 103  CO2 21*  GLUCOSE 141*  BUN 47*  CREATININE 1.17*  CALCIUM 9.0   GFR: Estimated Creatinine Clearance: 30.5 mL/min (A) (by C-G formula based on SCr of 1.17 mg/dL (H)). Liver Function Tests: Recent Labs  Lab 10/18/23 2030  AST 24  ALT 14  ALKPHOS 58  BILITOT 0.5  PROT 6.4*  ALBUMIN 3.2*  No results for input(s): LIPASE, AMYLASE in the last 168 hours. No results for input(s): AMMONIA in the last 168 hours. Coagulation Profile: No results for input(s): INR, PROTIME in the last 168 hours. Cardiac Enzymes: No results for input(s): CKTOTAL, CKMB, CKMBINDEX, TROPONINI in the last 168 hours. BNP (last 3 results) No results for input(s): PROBNP in the last 8760 hours. HbA1C: No results for input(s): HGBA1C in the last 72 hours. CBG: Recent Labs  Lab 10/19/23 0004  GLUCAP 110*   Lipid Profile: No results for input(s): CHOL, HDL, LDLCALC, TRIG, CHOLHDL, LDLDIRECT in the last 72 hours. Thyroid Function Tests: No results for input(s): TSH, T4TOTAL, FREET4, T3FREE, THYROIDAB in the last 72 hours. Anemia Panel: No results for input(s): VITAMINB12, FOLATE, FERRITIN, TIBC, IRON, RETICCTPCT in the last 72 hours. Urine analysis:    Component Value Date/Time   COLORURINE YELLOW (A) 10/18/2023 2145   APPEARANCEUR HAZY (A) 10/18/2023 2145   LABSPEC 1.024 10/18/2023 2145   PHURINE 5.0 10/18/2023 2145   GLUCOSEU NEGATIVE 10/18/2023 2145   HGBUR NEGATIVE 10/18/2023 2145   BILIRUBINUR NEGATIVE 10/18/2023 2145    KETONESUR NEGATIVE 10/18/2023 2145   PROTEINUR NEGATIVE 10/18/2023 2145   NITRITE NEGATIVE 10/18/2023 2145   LEUKOCYTESUR SMALL (A) 10/18/2023 2145   Sepsis Labs: @LABRCNTIP (procalcitonin:4,lacticidven:4) )No results found for this or any previous visit (from the past 240 hours).   Radiological Exams on Admission:   Assessment/Plan Principal Problem:   UTI (urinary tract infection) Active Problems:   Hyperkalemia   HTN (hypertension)   Hyperlipidemia   ILD (interstitial lung disease) (HCC)   Type II diabetes mellitus with renal manifestations (HCC)   Chronic kidney disease, stage 3a (HCC)   Depression   Assessment and Plan:   UTI (urinary tract infection): UA showed hazy appearance, small amount of leukocyte, many bacteria and WBC 11-20.  No fever or leukocytosis.  Clinically not septic.  Her generalized weakness is likely due to UTI.  -Placed in telemetry bed for observation - IV Rocephin  - Follow-up urine culture - IV fluid: 1 L normal saline  -PT/OT for generalized weakness  Hyperkalemia: Potassium 5.5 - IV fluid as above - 5 g of Lokelma   HTN (hypertension) -Hold losartan due to hyperkalemia - IV hydralazine  as needed  Hyperlipidemia -Patient is not sure if she is taking Zocor not --> pending med reconciliation by pharmacist  ILD (interstitial lung disease) (HCC): Stable, no SOB -Bronchodilators as needed Mucinex   Type II diabetes mellitus with renal manifestations (HCC): Recent A1c, patient is taking metformin. -SSI  Chronic kidney disease, stage 3a (HCC): Renal function close to baseline.  Baseline creatinine 1.1 on 03/23/2023.  Her creatinine is 1.17, BUN 47, GFR 45 today. -Follow-up with BMP  Depression - Patient is not sure if she is taking Paxil --> pending med reconciliation by pharmacist.      DVT ppx: SQ Lovenox   Code Status: Full code    Family Communication:   Yes, patient's husband   at bed side.      Disposition Plan:  Anticipate  discharge back to previous environment  Consults called: None  Admission status and Level of care: Telemetry Medical:    for obs     Dispo: The patient is from: Home              Anticipated d/c is to: Home              Anticipated d/c date is: 1 day  Patient currently is not medically stable to d/c.    Severity of Illness:  The appropriate patient status for this patient is OBSERVATION. Observation status is judged to be reasonable and necessary in order to provide the required intensity of service to ensure the patient's safety. The patient's presenting symptoms, physical exam findings, and initial radiographic and laboratory data in the context of their medical condition is felt to place them at decreased risk for further clinical deterioration. Furthermore, it is anticipated that the patient will be medically stable for discharge from the hospital within 2 midnights of admission.        Date of Service 10/19/2023    Caleb Exon [facility]   If 7PM-7AM, please contact night-coverage [email or web address] 10/19/2023, 1:14 AM

## 2023-10-18 NOTE — ED Notes (Signed)
 Pt to radiology at this time.

## 2023-10-18 NOTE — ED Triage Notes (Addendum)
 Pt arrives via ems from home for generalized weakness that started this morning upon waking. Pt reports chronic knee pain. Pt denies sob, cp, n/v/d or any recent falls.

## 2023-10-19 DIAGNOSIS — N3000 Acute cystitis without hematuria: Secondary | ICD-10-CM | POA: Diagnosis not present

## 2023-10-19 DIAGNOSIS — E875 Hyperkalemia: Secondary | ICD-10-CM | POA: Diagnosis not present

## 2023-10-19 DIAGNOSIS — F32A Depression, unspecified: Secondary | ICD-10-CM

## 2023-10-19 DIAGNOSIS — E785 Hyperlipidemia, unspecified: Secondary | ICD-10-CM | POA: Diagnosis not present

## 2023-10-19 DIAGNOSIS — I10 Essential (primary) hypertension: Secondary | ICD-10-CM | POA: Diagnosis not present

## 2023-10-19 LAB — CBC
HCT: 32.5 % — ABNORMAL LOW (ref 36.0–46.0)
Hemoglobin: 10.4 g/dL — ABNORMAL LOW (ref 12.0–15.0)
MCH: 30.7 pg (ref 26.0–34.0)
MCHC: 32 g/dL (ref 30.0–36.0)
MCV: 95.9 fL (ref 80.0–100.0)
Platelets: 277 K/uL (ref 150–400)
RBC: 3.39 MIL/uL — ABNORMAL LOW (ref 3.87–5.11)
RDW: 13.2 % (ref 11.5–15.5)
WBC: 8.7 K/uL (ref 4.0–10.5)
nRBC: 0 % (ref 0.0–0.2)

## 2023-10-19 LAB — CBG MONITORING, ED
Glucose-Capillary: 107 mg/dL — ABNORMAL HIGH (ref 70–99)
Glucose-Capillary: 110 mg/dL — ABNORMAL HIGH (ref 70–99)

## 2023-10-19 LAB — BASIC METABOLIC PANEL WITH GFR
Anion gap: 11 (ref 5–15)
BUN: 38 mg/dL — ABNORMAL HIGH (ref 8–23)
CO2: 22 mmol/L (ref 22–32)
Calcium: 8.4 mg/dL — ABNORMAL LOW (ref 8.9–10.3)
Chloride: 107 mmol/L (ref 98–111)
Creatinine, Ser: 1 mg/dL (ref 0.44–1.00)
GFR, Estimated: 54 mL/min — ABNORMAL LOW (ref >60)
Glucose, Bld: 120 mg/dL — ABNORMAL HIGH (ref 70–99)
Potassium: 5.1 mmol/L (ref 3.5–5.1)
Sodium: 140 mmol/L (ref 135–145)

## 2023-10-19 MED ORDER — SODIUM ZIRCONIUM CYCLOSILICATE 5 G PO PACK
5.0000 g | PACK | Freq: Once | ORAL | Status: AC
  Administered 2023-10-19: 5 g via ORAL
  Filled 2023-10-19: qty 1

## 2023-10-19 MED ORDER — ALPRAZOLAM 0.25 MG PO TABS
0.2500 mg | ORAL_TABLET | Freq: Once | ORAL | Status: AC
  Administered 2023-10-19: 0.25 mg via ORAL
  Filled 2023-10-19: qty 1

## 2023-10-19 MED ORDER — CEPHALEXIN 500 MG PO CAPS: 500.0000 mg | ORAL_CAPSULE | Freq: Three times a day (TID) | ORAL | 0 refills | Status: AC

## 2023-10-19 MED ORDER — BENAZEPRIL HCL 20 MG PO TABS
30.0000 mg | ORAL_TABLET | Freq: Every day | ORAL | Status: DC
  Administered 2023-10-19: 30 mg via ORAL
  Filled 2023-10-19: qty 1

## 2023-10-19 NOTE — Evaluation (Signed)
 Occupational Therapy Evaluation Patient Details Name: [name] MRN: [id number] DOB: 02/06/1933 Today's Date: 10/19/2023   History of Present Illness   [name] is a 89 y.o. female with medical history significant of HTN, HLD, DM, depression, CKD stage III, ILD, hearing loss with hearing aid, insomnia, who presents with generalized weakness. Concern for UTI    Clinical Impressions Ms Kahler was seen for OT evaluation this date. Prior to hospital admission, pt was MOD I using 3WW. Pt lives at [facility]. Pt currently requires MIN A don brief in standing. SBA + RW for toilet t/f, pericare, and standing grooming. Pt would benefit from skilled OT to address noted impairments and functional limitations (see below for any additional details). Upon hospital discharge, recommend OT follow up.     If plan is discharge home, recommend the following:   A little help with walking and/or transfers;A little help with bathing/dressing/bathroom     Functional Status Assessment   Patient has had a recent decline in their functional status and demonstrates the ability to make significant improvements in function in a reasonable and predictable amount of time.     Equipment Recommendations   None recommended by OT     Recommendations for Other Services         Precautions/Restrictions   Precautions Precautions: Fall Recall of Precautions/Restrictions: Intact Restrictions Weight Bearing Restrictions Per Provider Order: No     Mobility Bed Mobility               General bed mobility comments: not tested    Transfers Overall transfer level: Needs assistance Equipment used: Rolling walker (2 wheels) Transfers: Sit to/from Stand Sit to Stand: Min assist           General transfer comment: MIN A from low toilet, CGA from elevated bed      Balance Overall balance assessment: Needs assistance Sitting-balance support: No upper extremity supported,  Feet supported Sitting balance-Leahy Scale: Good     Standing balance support: No upper extremity supported, During functional activity Standing balance-Leahy Scale: Fair                             ADL either performed or assessed with clinical judgement   ADL Overall ADL's : Needs assistance/impaired                                       General ADL Comments: MIN A don brief in standing. SBA + RW for toilet t/f, pericare, and standing grooming.     Vision         Perception         Praxis         Pertinent Vitals/Pain Pain Assessment Pain Assessment: No/denies pain     Extremity/Trunk Assessment Upper Extremity Assessment Upper Extremity Assessment: Overall WFL for tasks assessed   Lower Extremity Assessment Lower Extremity Assessment: Generalized weakness       Communication Communication Communication: No apparent difficulties   Cognition Arousal: Alert Behavior During Therapy: WFL for tasks assessed/performed Cognition: No apparent impairments                               Following commands: Intact       Cueing  General Comments  Exercises     Shoulder Instructions      Home Living Family/patient expects to be discharged to:: Assisted living                             Home Equipment: Rolling Walker (2 wheels);Shower seat;Grab bars - toilet;Grab bars - tub/shower   Additional Comments: [place] [employer]      Prior Functioning/Environment Prior Level of Function : Independent/Modified Independent             Mobility Comments: 3WW for community mobility      OT Problem List: Decreased strength        OT Goals(Current goals can be found in the care plan section)   Acute Rehab OT Goals Patient Stated Goal: to go home OT Goal Formulation: With patient Time For Goal Achievement: 10/19/23 Potential to Achieve Goals: Good   AM-PAC OT 6 Clicks Daily  Activity     Outcome Measure Help from another person eating meals?: None Help from another person taking care of personal grooming?: None Help from another person toileting, which includes using toliet, bedpan, or urinal?: None Help from another person bathing (including washing, rinsing, drying)?: A Little Help from another person to put on and taking off regular upper body clothing?: None Help from another person to put on and taking off regular lower body clothing?: A Little 6 Click Score: 22   End of Session Equipment Utilized During Treatment: Rolling walker (2 wheels)  Activity Tolerance: Patient tolerated treatment well Patient left: in chair;with call bell/phone within reach;with family/visitor present  OT Visit Diagnosis: Unsteadiness on feet (R26.81);Muscle weakness (generalized) (M62.81)                Time: 9099-9085 OT Time Calculation (min): 14 min Charges:  OT General Charges $OT Visit: 1 Visit OT Evaluation $OT Eval Low Complexity: 1 Low  Elston Slot, M.S. OTR/L  10/19/23, 9:20 AM  ascom [phone]

## 2023-10-19 NOTE — ED Notes (Signed)
 Pt called out c/o soiled brief. Puriwck became dislodged and soaked pt. Pt cleaned and new brief and purwick placed. Pt requesting something to promote rest. Nui,MD notified. See [facility]

## 2023-10-19 NOTE — Evaluation (Addendum)
 Physical Therapy Evaluation Patient Details Name: [name] MRN: [id number] DOB: 01/15/1933 Today's Date: 10/19/2023  History of Present Illness  Pt is a 89 y.o. female with medical history significant of HTN, HLD, DM, depression, CKD stage III, ILD, hearing loss with hearing aid, insomnia, who presented to the ED with generalized weakness. MD assessment includes UTI, hyperkalemia, and type II diabetes mellitus with renal manifestations.  Clinical Impression  Pt was pleasant and motivated to participate during the session and put forth good effort throughout. Pt required VC's to maintain RW within BOS during ambulation with minimal pt carryover. Pt required occasional physical assistance to maintain balance during ambulation, due to pt demonstrating severe bilateral genu valgum. Pt's spouse who was present during session stated that ambulation appeared to be at pt's baseline. Pt reported no adverse symptoms during the session other than chronic bilateral knee pain with SpO2 and HR WNL throughout on room air. Pt will benefit from continued PT services upon discharge to safely address deficits listed in patient problem list for decreased caregiver assistance and eventual return to PLOF.           If plan is discharge home, recommend the following: A little help with walking and/or transfers;A little help with bathing/dressing/bathroom;Assistance with cooking/housework;Assist for transportation   Can travel by private vehicle        Equipment Recommendations None recommended by PT  Recommendations for Other Services       Functional Status Assessment Patient has had a recent decline in their functional status and demonstrates the ability to make significant improvements in function in a reasonable and predictable amount of time.     Precautions / Restrictions Precautions Precautions: Fall Recall of Precautions/Restrictions: Intact Restrictions Weight Bearing Restrictions Per Provider  Order: No      Mobility  Bed Mobility Overal bed mobility: Modified Independent             General bed mobility comments: Pt required extra time/effort for all bed mobility tasks, but no physical assistance or use of bedrails required    Transfers Overall transfer level: Needs assistance Equipment used: Rolling walker (2 wheels) Transfers: Sit to/from Stand Sit to Stand: Contact guard assist, From elevated surface           General transfer comment: Pt required CGA for STS from elevated EOB with RW, but remained steady throughout and no LOBs occured    Ambulation/Gait Ambulation/Gait assistance: Contact guard assist, Min assist Gait Distance (Feet): 40 Feet Assistive device: Rolling walker (2 wheels) Gait Pattern/deviations: Decreased step length - right, Decreased step length - left, Trunk flexed, Knee flexed in stance - right, Knee flexed in stance - left, Shuffle Gait velocity: decreased     General Gait Details: Pt demonstrated bilateral flexed knees and severe genu valgum during ambulation. VC's were utilized to encourage bilateral knee extension with no pt carryover. Pt demonstrated poor trunk control during ambulation due to genu valgum and required occasional minA to maintain balance. Pt required VC's to keep RW within BOS during ambulation and turning, which minimally improved as ambulation progressed.  Stairs            Wheelchair Mobility     Tilt Bed    Modified Rankin (Stroke Patients Only)       Balance Overall balance assessment: Needs assistance Sitting-balance support: No upper extremity supported, Feet supported Sitting balance-Leahy Scale: Good     Standing balance support: During functional activity, Bilateral upper extremity supported, Reliant on assistive device  for balance Standing balance-Leahy Scale: Poor Standing balance comment: Pt heavily reliant on RW to maintain balance in stance and ambulation                              Pertinent Vitals/Pain Pain Assessment Pain Assessment: 0-10 Pain Score: 7  Pain Location: Chronic bilateral knee pain Pain Descriptors / Indicators: Constant, Discomfort Pain Intervention(s): Monitored during session    Home Living Family/patient expects to be discharged to:: Assisted living                 Home Equipment: Agricultural consultant (2 wheels);Shower seat;Grab bars - toilet;Grab bars - tub/shower;Rollator (4 wheels);Toilet riser Additional Comments: [place]    Prior Function Prior Level of Function : Independent/Modified Independent             Mobility Comments: Pt reported being modI with household ambulation with 3WW. Pt stated occasional limited community ambulation with 3WW. Pt reported 1 fall in past 6 months, occurring due to AD being beyond BOS during ambulation and losing balance.  ADLs Comments: Pt reported being independent with ADLs without AD use.     Extremity/Trunk Assessment   Upper Extremity Assessment Upper Extremity Assessment: Defer to OT evaluation    Lower Extremity Assessment Lower Extremity Assessment: Generalized weakness; lack significant bilateral knee extension secondary to chronic OA       Communication   Communication Communication: No apparent difficulties    Cognition Arousal: Alert Behavior During Therapy: WFL for tasks assessed/performed                             Following commands: Intact       Cueing Cueing Techniques: Verbal cues, Gestural cues     General Comments      Exercises     Assessment/Plan    PT Assessment Patient needs continued PT services  PT Problem List Decreased strength;Decreased balance;Pain;Decreased range of motion;Decreased mobility;Decreased activity tolerance       PT Treatment Interventions DME instruction;Therapeutic activities;Gait training;Therapeutic exercise;Stair training;Functional mobility training    PT Goals (Current goals can be  found in the Care Plan section)  Acute Rehab PT Goals Patient Stated Goal: to be able to visit family and friends PT Goal Formulation: With patient Time For Goal Achievement: 11/01/23 Potential to Achieve Goals: Good    Frequency Min 2X/week     Co-evaluation               AM-PAC PT 6 Clicks Mobility  Outcome Measure Help needed turning from your back to your side while in a flat bed without using bedrails?: None Help needed moving from lying on your back to sitting on the side of a flat bed without using bedrails?: None Help needed moving to and from a bed to a chair (including a wheelchair)?: A Little Help needed standing up from a chair using your arms (e.g., wheelchair or bedside chair)?: A Little Help needed to walk in hospital room?: A Little Help needed climbing 3-5 steps with a railing? : A Lot 6 Click Score: 19    End of Session Equipment Utilized During Treatment: Gait belt Activity Tolerance: Patient tolerated treatment well Patient left: in bed;with call bell/phone within reach;with family/visitor present;Other (comment) (OT began session upon PT leaving) Nurse Communication: Mobility status PT Visit Diagnosis: Unsteadiness on feet (R26.81);Muscle weakness (generalized) (M62.81);Pain Pain - Right/Left:  (bilateral) Pain -  part of body: Knee    Time: 9156-9094 PT Time Calculation (min) (ACUTE ONLY): 22 min   Charges:                 Leontine Ingles, SPT 10/19/23, 10:52 AM This entire session was performed under direct supervision and direction of a licensed therapist/therapist assistant. I have personally read, edited and approve of the note as written.  Carmin Deed, DPT

## 2023-10-19 NOTE — ED Notes (Signed)
Pt provided w/ sandwich per request.

## 2023-10-19 NOTE — Hospital Course (Addendum)
 Taken from H&P.  YUNIQUE DEARCOS is a 89 y.o. female with medical history significant of HTN, HLD, DM, depression, CKD stage III, ILD, hearing loss with hearing aid, insomnia, who presents with generalized weakness.  Denies any focal weakness or falls. No urinary symptoms.  On presentation stable vital, UA concerning for UTI with leukocytes and many bacteria, WBC 10.3, potassium 5.5.  Chest x-ray with chronic interstitial lung disease.  CT head was negative for any acute intracranial abnormality.  MRI of brain was also negative but did showed remote left frontal lobe infarction.  Patient was started on Rocephin  and urine cultures were sent. She was also given Lokelma  for hyperkalemia.  7/24: Vital stable, potassium improved to 5.1, urine cultures pending. PT and OT recommended home health.  Patient does not have any urinary symptoms, no prior history of recurrent UTI.  She received 2 doses of ceftriaxone  and was given 3 more days of Keflex .  Home health services ordered.  Patient was also on benazepril  at home which can be contributory to mild hyperkalemia along with dehydration.  She was instructed to keep herself well-hydrated.  Need to have a repeat potassium levels in couple of days and if started trending up, she might need a change of her antihypertensives.  She will continue on current medications and follow-up with her providers closely for further assistance.

## 2023-10-19 NOTE — Discharge Summary (Signed)
 Physician Discharge Summary   Patient: [name] MRN: [id number] DOB: [birth year]  Admit date:     10/18/2023  Discharge date: 10/19/23  Discharge Physician: Amaryllis Dare   PCP: [name], MD   Recommendations at discharge:  Please obtain CBC and BMP next couple of days-patient need monitoring of potassium level and if started trending up then please consider changing her ACE inhibitor. Please follow-up on final urine culture results and ensure completion of appropriate antibiotic course. Follow-up with primary care provider within next few days.  Discharge Diagnoses: Principal Problem:   UTI (urinary tract infection) Active Problems:   Hyperkalemia   HTN (hypertension)   Hyperlipidemia   ILD (interstitial lung disease) (HCC)   Type II diabetes mellitus with renal manifestations (HCC)   Chronic kidney disease, stage 3a [facility])   Depression   Hospital Course:  [name] is a 89 y.o. female with medical history significant of HTN, HLD, DM, depression, CKD stage III, ILD, hearing loss with hearing aid, insomnia, who presents with generalized weakness.  Denies any focal weakness or falls. No urinary symptoms.  On presentation stable vital, UA concerning for UTI with leukocytes and many bacteria, WBC 10.3, potassium 5.5.  Chest x-ray with chronic interstitial lung disease.  CT head was negative for any acute intracranial abnormality.  MRI of brain was also negative but did showed remote left frontal lobe infarction.  Patient was started on Rocephin  and urine cultures were sent. She was also given Lokelma  for hyperkalemia.  7/24: Vital stable, potassium improved to 5.1, urine cultures pending. PT and OT recommended home health.  Patient does not have any urinary symptoms, no prior history of recurrent UTI.  She received 2 doses of ceftriaxone  and was given 3 more days of Keflex .  Home health services ordered.  Patient was also on benazepril  at home which can be  contributory to mild hyperkalemia along with dehydration.  She was instructed to keep herself well-hydrated.  Need to have a repeat potassium levels in couple of days and if started trending up, she might need a change of her antihypertensives.  She will continue on current medications and follow-up with her providers closely for further assistance.  Consultants: None Procedures performed: None Disposition: Home health Diet recommendation:  Discharge Diet Orders (From admission, onward)     Start     Ordered   10/19/23 0000  Diet - low sodium heart healthy        10/19/23 1016           Cardiac and Carb modified diet DISCHARGE MEDICATION: Allergies as of 10/19/2023       Reactions   Tetanus Antitoxin Rash, Shortness Of Breath   Tetanus Toxoids Shortness Of Breath, Rash        Medication List     TAKE these medications    aspirin EC 81 MG tablet Take 81 mg by mouth daily.   benazepril  20 MG tablet Commonly known as: LOTENSIN  Take 30 mg by mouth daily.   cephALEXin  500 MG capsule Commonly known as: KEFLEX  Take 1 capsule (500 mg total) by mouth 3 (three) times daily for 3 days.   ezetimibe  10 MG tablet Commonly known as: ZETIA  Take 1 tablet (10 mg total) by mouth daily.   fexofenadine 180 MG tablet Commonly known as: ALLEGRA Take 180 mg by mouth at bedtime.   meloxicam 15 MG tablet Commonly known as: MOBIC Take 15 mg by mouth daily.   metFORMIN 500 MG tablet  Commonly known as: GLUCOPHAGE Take 500 mg by mouth 2 (two) times daily.   Multi-Vitamin tablet Take 1 tablet by mouth daily.   PARoxetine 40 MG tablet Commonly known as: PAXIL Take 40 mg by mouth daily.   Qnasl 80 MCG/ACT Aers Generic drug: Beclomethasone Dipropionate Place 2 sprays into the nose 2 (two) times daily as needed.   simvastatin 40 MG tablet Commonly known as: ZOCOR Take 40 mg by mouth at bedtime.        Follow-up Information     Fernande Ophelia PARAS III, MD. Schedule an  appointment as soon as possible for a visit in 1 week(s).   Specialty: Internal Medicine Contact information: [address] [place] [address] [phone]                Discharge Exam: [name]   10/18/23 2023  Weight: 62.1 kg   General.  Frail elderly lady, in no acute distress.  Hard of hearing Pulmonary.  Lungs clear bilaterally, normal respiratory effort. CV.  Regular rate and rhythm, no JVD, rub or murmur. Abdomen.  Soft, nontender, nondistended, BS positive. CNS.  Alert and oriented .  No focal neurologic deficit. Extremities.  No edema,  pulses intact and symmetrical. Psychiatry.  Judgment and insight appears normal.   Condition at discharge: stable  The results of significant diagnostics from this hospitalization (including imaging, microbiology, ancillary and laboratory) are listed below for reference.   Imaging Studies: MR BRAIN WO CONTRAST Result Date: 10/18/2023 CLINICAL DATA:  Neuro deficit, concern for stroke, generalized weakness. EXAM: MRI HEAD WITHOUT CONTRAST TECHNIQUE: Multiplanar, multiecho pulse sequences of the brain and surrounding structures were obtained without intravenous contrast. COMPARISON:  Same-day head CT.  MRI head 04/30/2013. FINDINGS: Brain: No acute infarct. No evidence of intracranial hemorrhage. T2/FLAIR hyperintensity in the periventricular and subcortical white matter with additional signal abnormality in the pons likely reflecting chronic microvascular ischemic changes. Additional encephalomalacia in the left frontal operculum suggestive of remote infarct. No edema, mass effect, or midline shift. Posterior fossa is unremarkable. Normal appearance of midline structures. The basilar cisterns are patent. No extra-axial fluid collections. Ventricles: Normal size and configuration of the ventricles. Vascular: Skull base flow voids are visualized. Skull and upper cervical spine: Degenerative changes in the visualized upper  cervical spine. Calvarium is unremarkable. Sinuses/Orbits: Right lens replacement. Orbits otherwise unremarkable. Mild mucosal thickening in the ethmoid sinuses and right maxillary sinus. Other: Mastoid air cells are clear. IMPRESSION: No acute intracranial abnormality. Moderate chronic microvascular ischemic changes. Remote infarct in the left frontal lobe. Electronically Signed   By: Donnice Mania M.D.   On: 10/18/2023 23:04   CT Head Wo Contrast Result Date: 10/18/2023 CLINICAL DATA:  Generalized weakness EXAM: CT HEAD WITHOUT CONTRAST TECHNIQUE: Contiguous axial images were obtained from the base of the skull through the vertex without intravenous contrast. RADIATION DOSE REDUCTION: This exam was performed according to the departmental dose-optimization program which includes automated exposure control, adjustment of the mA and/or kV according to patient size and/or use of iterative reconstruction technique. COMPARISON:  MRI 05/28/2013, CT brain 02/06/2019 FINDINGS: Brain: No acute territorial infarction, hemorrhage or intracranial mass. Mild chronic small vessel ischemic changes of the white matter. Stable chronic hypodensity within the left frontal white matter likely due to old ischemic change. Non enlarged ventricles Vascular: No hyperdense vessels.  Carotid vascular calcification Skull: Normal. Negative for fracture or focal lesion. Sinuses/Orbits: No acute finding. Other: None IMPRESSION: 1. No CT evidence for acute intracranial abnormality. 2.  Mild chronic small vessel ischemic changes of the white matter and old left frontal white matter infarct. Electronically Signed   By: Luke Bun M.D.   On: 10/18/2023 21:02   DG Chest 2 View Result Date: 10/18/2023 CLINICAL DATA:  Generalized weakness. EXAM: CHEST - 2 VIEW COMPARISON:  None Available. FINDINGS: The heart size and mediastinal contours are within normal limits. There is marked severity calcification of the aortic arch. A mild reticulonodular  pattern is seen along the periphery of both lungs, right slightly greater than left. No focal consolidation, pleural effusion or pneumothorax is identified. Mild to moderate severity dextroscoliosis of the mid and lower thoracic spine is seen with multilevel degenerative changes. IMPRESSION: Findings suggestive of chronic interstitial lung disease. Electronically Signed   By: Suzen Dials M.D.   On: 10/18/2023 20:59    Microbiology: No results found for this or any previous visit.  Labs: CBC: Recent Labs  Lab 10/18/23 2030 10/19/23 0601  WBC 10.3 8.7  NEUTROABS 6.9  --   HGB 10.6* 10.4*  HCT 32.7* 32.5*  MCV 93.7 95.9  PLT 298 277   Basic Metabolic Panel: Recent Labs  Lab 10/18/23 2030 10/19/23 0601  NA 135 140  K 5.5* 5.1  CL 103 107  CO2 21* 22  GLUCOSE 141* 120*  BUN 47* 38*  CREATININE 1.17* 1.00  CALCIUM 9.0 8.4*   Liver Function Tests: Recent Labs  Lab 10/18/23 2030  AST 24  ALT 14  ALKPHOS 58  BILITOT 0.5  PROT 6.4*  ALBUMIN 3.2*   CBG: Recent Labs  Lab 10/19/23 0004 10/19/23 0727  GLUCAP 110* 107*    Discharge time spent: greater than 30 minutes.  This record has been created using Conservation officer, historic buildings. Errors have been sought and corrected,but may not always be located. Such creation errors do not reflect on the standard of care.   Signed: Amaryllis Dare, MD [facility] 10/19/2023

## 2023-10-19 NOTE — TOC CM/SW Note (Signed)
..  Transition of Care [facility]) - Inpatient Brief Assessment   Patient Details  Name: [name] MRN: [id number] Date of Birth: 04-Feb-1933  Transition of Care [facility]) CM/SW Contact:    Gina DELENA Fischer, LCSW Phone Number: 10/19/2023, 3:27 PM   Clinical Narrative:  SW met with pt and pt husband at beside.  SW expressed to pt and pt husband that sw received call from [place] regarding skilled placement by husband.  Husband stated that pt receives support from facility (pt current residence is a [employer]) and can go home with [facility] services. SW expressed that sw can assist with locating HH. Pt husband stated that [facility] is offered at facility.  Pt stated that its hard at times for her to sit down, stand p or walk by herself at time.  SW reviewed PT and OT eval with pt and husband.  SW reached out to PT, [employer], regarding sit to stand device.  [name] expressed she doesn't need it if you can keep her in the walker, she walked to the door and back X 2 this AM with us  that its not needed.  SW expressed to [facility] that sw wanted to help figure out things that would help pt in the home.  Pt stated the she does not feel comfortable with walker and would like assistance with sitting, standing up and walking.  [name] response insurance isn't going to pay for a lift chair but if they wanted one it would make up/down easier... that being said from the (admittedly higher than average) ED bed she got up w/o assist. She seems to be a little weak but actually close to her baseline I don't think we need to get a lot of extra equipment in there - they [place] with all the grab bars, etc everywhere. I know they talked about doing a few respite/SNF days when they get back (I know the husband was talking with admission coordinator about that). She should use this walker (And we talked about that during our session) but if she likes the less stable but more maneuverable 3 & 4 wheeled walkers she can do  that... all that to say, she probably doesn't need any more DME at this time  SW summarized [name] response to pt and pt husband.  SW expressed that lift chair would be useful in the home but wouldn't be covered by insurance and pt may benefit from wheel chair on days she feels weak to prevent falls.  SW identified local store that pt and pt husband could visit for lift chair, adjustable bed and wheelchair.  Pt husband expressed he was familiar with store([employer]). Pt husband stated that he will use pt rollator instead of using wheelchair at this time.   Transition of Care Asessment:

## 2023-10-19 NOTE — ED Notes (Signed)
 Pt not on monitor. Doing PT & OT currently

## 2023-10-19 NOTE — TOC CM/SW Note (Signed)
 SW received call from [place] from [employer]	[place] at [employer] at [place] regarding pt. Suzen works admissions at facility and stated that she spoke with pt husband. Pt husband expressed to her that he can't take care of pt at home and requested skilled care.  Suzen stated that she will need FL2, medications and discharge paperwork if implemented.   SW contacted Dr. Caleen regarding call from [place] about pt potential husbands request.  Dr. Caleen response PT evaluated her and they were recommending home health, I did talk with her son who is our ENT surgeon, and he was okay with that   SW to follow up with pt and pt husband regarding placement needs.

## 2023-10-19 NOTE — ED Notes (Signed)
 Youlanda Roys, MD at bedside

## 2023-10-22 LAB — URINE CULTURE: Culture: >=100000 — AB

## 2023-12-19 NOTE — Progress Notes (Signed)
 History of Present Illness [name] is an 89 year old female who presents with recent fevers and chills.  She began feeling unwell two days ago with symptoms of shaking all over and general malaise. She did not check her temperature during this episode. The symptoms were short-lived and resolved by the time of the visit.  She has a history of a severe urinary tract infection in July, which required emergency room visits. She was prescribed antibiotics for a potential bladder infection but has not started them as they were not yet delivered. No new bladder symptoms or worsening of her chronic cough.  No recent cough or new bladder symptoms. She was able to eat and drink normally during the episode and did not take any over-the-counter medications such as Tylenol .  Her past medical history includes hypertension, dyslipidemia, aortic atherosclerosis, interstitial lung disease, chronic cough, mild cognitive impairment, and depression. Depression symptoms are managed with medication. No significant changes in her cognitive function or mood, although she experienced some confusion when the fever was worse.   Current Outpatient Medications  Medication Sig Dispense Refill  . amLODIPine (NORVASC) 5 MG tablet Take 1 tablet (5 mg total) by mouth once daily 90 tablet 3  . aspirin 81 MG EC tablet Take 81 mg by mouth once daily.    SABRA atorvastatin (LIPITOR) 20 MG tablet Take 1 tablet (20 mg total) by mouth once daily 90 tablet 3  . beclomethasone dipropionate (QNASL) 80 mcg/actuation HFAA 2 sprays by Nasal route 2 (two) times daily. As needed 8.7 g 11  . benazepriL  (LOTENSIN ) 20 MG tablet Take 1.5 tablets (30 mg total) by mouth once daily 135 tablet 3  . ezetimibe  (ZETIA ) 10 mg tablet     . hydroCHLOROthiazide (HYDRODIURIL) 25 MG tablet Take 1 tablet (25 mg total) by mouth every morning 30 tablet 11  . meloxicam (MOBIC) 7.5 MG tablet Take 1 tablet (7.5 mg total) by mouth 2 (two) times daily as needed  for Pain 180 tablet 1  . metFORMIN (GLUCOPHAGE) 500 MG tablet Take 1 tablet (500 mg total) by mouth 2 (two) times daily with meals 180 tablet 3  . multivitamin tablet Take 1 tablet by mouth once daily.    SABRA PARoxetine (PAXIL) 40 MG tablet Take 1 tablet (40 mg total) by mouth once daily 30 tablet 11  . ciprofloxacin HCl (CIPRO) 500 MG tablet Take 1 tablet (500 mg total) by mouth 2 (two) times daily for 5 days (Patient not taking: Reported on 12/19/2023) 10 tablet 0   No current facility-administered medications for this visit.    Allergies as of 12/19/2023 - Reviewed 12/19/2023  Allergen Reaction Noted  . Tetanus and diphtheria toxoids Rash and Shortness Of Breath 02/06/2019  . Keflex  [cephalexin ] Diarrhea 12/02/2023    Past Medical History:  Diagnosis Date  . Adenomatous polyps    on previous colonoscopy. Colon 10/12 with diverticulosis, internal hemorrhoids; repeat recommended 3-5 yrs.  . Aortic atherosclerosis ()   . Essential hypertension, benign   . ILD (interstitial lung disease) (CMS/HHS-HCC)   . Insomnia   . Menopausal syndrome   . Osteopenia 09/30/2013  . Other and unspecified hyperlipidemia   . Pure hypercholesterolemia   . Recurrent major depressive disorder, in full remission ()   . Senile purpura ()   . Type II or unspecified type diabetes mellitus without mention of complication, not stated as uncontrolled (CMS/HHS-HCC)     Past Surgical History:  Procedure Laterality Date  . KNEE ARTHROSCOPY Left  04/11    Health Maintenance Topics with due status: Overdue     Topic Date Due   Shingrix Never done   Hemoglobin A1C 09/21/2023   Health Maintenance Topics with due status: Due On     Topic Date Due   Influenza Vaccine 11/27/2023    Family History  Problem Relation Name Age of Onset  . Osteoporosis (Thinning of bones) Mother    . Coronary Artery Disease (Blocked arteries around heart) Mother    . Cancer Father    . High blood pressure (Hypertension) Father       Social History   Socioeconomic History  . Marital status: Widowed  Tobacco Use  . Smoking status: Never  . Smokeless tobacco: Never  Substance and Sexual Activity  . Alcohol use: Yes    Alcohol/week: 0.0 standard drinks of alcohol    Comment: occasionally    Goals Addressed   None       BP 139/70   Pulse 92   Temp 37.2 C (99 F)   Ht 160 cm (5' 3)   Wt 58.1 kg (128 lb)   SpO2 96%   BMI 22.67 kg/m   General:  Well developed, well nourished patient, in no distress. HEENT: TMs are clear.  Oropharynx moist without lesions. Neck:  Trachea midline.  No thyromegaly Cardiac:  Regular rate and rhythm without gallop, rub.  Carotid and radial pulses 2+. Lungs: basilar crackles without retractions or wheezing, without progression.   Abdomen: Soft, nontender, nondistended.  No guard, rebound, mass.  No suprapubic tenderness  Extremities:  No clubbing, cyanosis.  Arthritic changes.  No significant edema.   Neuro: Decreased hearing, with hearing aids in place,.  Generalized leg weakness, using a walker Dermatologic: Senile purpura on both arms and legs; no other significant rashes or nodules   Fever and chills, unspecified  (primary encounter diagnosis) Essential hypertension, benign Aortic atherosclerosis () ILD (interstitial lung disease) (CMS/HHS-HCC) Controlled type 2 diabetes mellitus without complication, without long-term current use of insulin  (CMS/HHS-HCC) Moderate episode of recurrent major depressive disorder (CMS/HHS-HCC)    Assessment & Plan Fever and chills, possible urinary tract infection Suspected recurrent urinary tract infection due to recent fever and chills, resolved quickly. Other infections less likely. - Order urinalysis and urine culture. - Check CBC. - Hold ciprofloxacin unless urinalysis shows significant inflammation or symptoms recur. - Encourage increased fluid intake. - Suggest one glass of cranberry juice daily if  tolerable.  Interstitial lung disease with chronic cough No progression of interstitial lung disease or chronic cough. - Defer pulmonary consultation unless symptoms worsen.  Essential hypertension Blood pressure well-controlled. - Continue current antihypertensive regimen. - Advise limiting sodium intake. - Monitor blood pressure at home.   BERT JACK KLEIN III, MD  This note has been created using automated tools and reviewed for accuracy by BERT JACK KLEIN III.  Use of artificial intelligence tool (ABRIDGE) to help with documentation was discussed with patient.

## 2024-01-03 ENCOUNTER — Other Ambulatory Visit: Payer: Self-pay

## 2024-01-03 ENCOUNTER — Emergency Department
Admission: EM | Admit: 2024-01-03 | Discharge: 2024-01-03 | Disposition: A | Discharge status: Home / Self Care | Attending: Emergency Medicine | Admitting: Emergency Medicine

## 2024-01-03 ENCOUNTER — Emergency Department

## 2024-01-03 DIAGNOSIS — N390 Urinary tract infection, site not specified: Secondary | ICD-10-CM | POA: Diagnosis not present

## 2024-01-03 DIAGNOSIS — I959 Hypotension, unspecified: Secondary | ICD-10-CM | POA: Insufficient documentation

## 2024-01-03 DIAGNOSIS — R5383 Other fatigue: Secondary | ICD-10-CM

## 2024-01-03 DIAGNOSIS — B9620 Unspecified Escherichia coli [E. coli] as the cause of diseases classified elsewhere: Secondary | ICD-10-CM

## 2024-01-03 DIAGNOSIS — R531 Weakness: Secondary | ICD-10-CM | POA: Diagnosis present

## 2024-01-03 DIAGNOSIS — I10 Essential (primary) hypertension: Secondary | ICD-10-CM | POA: Diagnosis not present

## 2024-01-03 LAB — URINALYSIS, ROUTINE W REFLEX MICROSCOPIC
Bilirubin Urine: NEGATIVE
Glucose, UA: NEGATIVE mg/dL
Hgb urine dipstick: NEGATIVE
Ketones, ur: NEGATIVE mg/dL
Leukocytes,Ua: NEGATIVE
Nitrite: NEGATIVE
Protein, ur: NEGATIVE mg/dL
Specific Gravity, Urine: 1.019 (ref 1.005–1.030)
pH: 5 (ref 5.0–8.0)

## 2024-01-03 LAB — BASIC METABOLIC PANEL WITH GFR
Anion gap: 12 (ref 5–15)
BUN: 35 mg/dL — ABNORMAL HIGH (ref 8–23)
CO2: 24 mmol/L (ref 22–32)
Calcium: 9.6 mg/dL (ref 8.9–10.3)
Chloride: 99 mmol/L (ref 98–111)
Creatinine, Ser: 1.06 mg/dL — ABNORMAL HIGH (ref 0.44–1.00)
GFR, Estimated: 51 mL/min — ABNORMAL LOW (ref >60)
Glucose, Bld: 115 mg/dL — ABNORMAL HIGH (ref 70–99)
Potassium: 4.7 mmol/L (ref 3.5–5.1)
Sodium: 135 mmol/L (ref 135–145)

## 2024-01-03 LAB — HEPATIC FUNCTION PANEL
ALT: 19 U/L (ref 0–44)
AST: 25 U/L (ref 15–41)
Albumin: 3.4 g/dL — ABNORMAL LOW (ref 3.5–5.0)
Alkaline Phosphatase: 78 U/L (ref 38–126)
Bilirubin, Direct: <0.1 mg/dL (ref 0.0–0.2)
Total Bilirubin: 0.5 mg/dL (ref 0.0–1.2)
Total Protein: 7.2 g/dL (ref 6.5–8.1)

## 2024-01-03 LAB — CBC
HCT: 30.4 % — ABNORMAL LOW (ref 36.0–46.0)
Hemoglobin: 9.7 g/dL — ABNORMAL LOW (ref 12.0–15.0)
MCH: 29.3 pg (ref 26.0–34.0)
MCHC: 31.9 g/dL (ref 30.0–36.0)
MCV: 91.8 fL (ref 80.0–100.0)
Platelets: 457 K/uL — ABNORMAL HIGH (ref 150–400)
RBC: 3.31 MIL/uL — ABNORMAL LOW (ref 3.87–5.11)
RDW: 13.2 % (ref 11.5–15.5)
WBC: 12.4 K/uL — ABNORMAL HIGH (ref 4.0–10.5)
nRBC: 0 % (ref 0.0–0.2)

## 2024-01-03 MED ORDER — CEFPODOXIME PROXETIL 200 MG PO TABS: 100.0000 mg | ORAL_TABLET | Freq: Two times a day (BID) | ORAL | 0 refills | Status: DC

## 2024-01-03 MED ORDER — SODIUM CHLORIDE 0.9 % IV SOLN
1.0000 g | INTRAVENOUS | Status: AC
  Administered 2024-01-03: 1 g via INTRAVENOUS
  Filled 2024-01-03: qty 10

## 2024-01-03 MED ORDER — SODIUM CHLORIDE 0.9 % IV BOLUS
500.0000 mL | Freq: Once | INTRAVENOUS | Status: AC
  Administered 2024-01-03: 500 mL via INTRAVENOUS

## 2024-01-03 NOTE — ED Provider Notes (Signed)
 [facility] Provider Note    None    (approximate)   History   Fatigue   HPI  [name] is a 89 y.o. female  hypertension, dyslipidemia, aortic atherosclerosis, interstitial lung disease, chronic cough, mild cognitive impairment, and depression   Recently saw physician at the end of September for concerns of fevers and chills.  She had a urine culture performed    Patient and husband report that she has has fatigue.  She has a lot of difficulty with mobility due to chronic debility knee problems.  She has been weak almost daily for 2 months, but the last 2 weeks has been a little bit more so.  Nothing sudden is changed but today she just continues to feel fatigued weak.  She was treated for urinary tract infection but does not really feel much better and still feels like she has some mild symptoms of urine infection  No fevers or chills.  No chest pain no shortness of breath.  She has not been confused beyond her norm, weakness or vomiting or other concerns  Discussed also with the patient's daughter at the bedside they checked 1 blood pressure today and it was slightly low at the house, but also reported her doctor to talk to them about perhaps stopping taking her blood pressure medicine Benzepril but continues to use.  She completed a course of ciprofloxacin after seeing the doctor 2 weeks ago  Still eating and drinking, but has to be encouraged to stay hydrated  Physical Exam   Triage Vital Signs: ED Triage Vitals [01/03/24 1122]  Encounter Vitals Group     BP 110/64     Girls Systolic BP Percentile      Girls Diastolic BP Percentile      Boys Systolic BP Percentile      Boys Diastolic BP Percentile      Pulse Rate 79     Resp 16     Temp 97.9 F (36.6 C)     Temp src      SpO2 100 %     Weight 130 lb (59 kg)     Height 5' 6 (1.676 m)     Head Circumference      Peak Flow      Pain Score 0     Pain Loc      Pain Education       Exclude from Growth Chart     Most recent vital signs: Vitals:   01/03/24 1510 01/03/24 1528  BP: 123/63 (!) 103/56  Pulse: 84 83  Resp: 18 17  Temp: 98.4 F (36.9 C)   SpO2: 98% 99%     General: Awake, no distress.  Very pleasant.  Oriented recognizes her husband and daughter gives reasonable history recalls going to the doctor a few weeks ago for treatment CV:  Good peripheral perfusion.  Normal tones and rate Resp:  Normal effort.  Clear bilateral Abd:  No distention.  Soft nontender nondistended throughout. Other:  Moves all extremities well.  No focal weakness.  Clear speech, pleasant.  Normocephalic atraumatic  Denies any feeling of faintness, no passing out   ED Results / Procedures / Treatments   Labs (all labs ordered are listed, but only abnormal results are displayed) Labs Reviewed  CBC - Abnormal; Notable for the following components:      Result Value   WBC 12.4 (*)    RBC 3.31 (*)    Hemoglobin 9.7 (*)  HCT 30.4 (*)    Platelets 457 (*)    All other components within normal limits  BASIC METABOLIC PANEL WITH GFR - Abnormal; Notable for the following components:   Glucose, Bld 115 (*)    BUN 35 (*)    Creatinine, Ser 1.06 (*)    GFR, Estimated 51 (*)    All other components within normal limits  URINALYSIS, ROUTINE W REFLEX MICROSCOPIC - Abnormal; Notable for the following components:   Color, Urine YELLOW (*)    APPearance HAZY (*)    All other components within normal limits  HEPATIC FUNCTION PANEL - Abnormal; Notable for the following components:   Albumin 3.4 (*)    All other components within normal limits  URINE CULTURE  Labs reviewed notable for mild leukocytosis.  Chronic anemia.  Chronic renal insufficiency without evidence of acute departure  Urinalysis without evidence of overt infection   EKG  And interpreted by me at 1340 heart rate 80 QRS 70 QTc 430 Sinus rhythm no evidence of acute ischemia.   RADIOLOGY  Chest x-ray inter  by me as increased interstitial markings but no evidence of acute  DG Chest 2 View Result Date: 01/03/2024 EXAM: 2 VIEW(S) XRAY OF THE CHEST 01/03/2024 02:06:40 PM COMPARISON: None available. CLINICAL HISTORY: Weakness, fatigue. Pt to ED with husband for fatigue for several weeks. Pt has dementia and husband reports pt has been laying in bed more than normal and possibly has UTI. Denies urinary sx. FINDINGS: LUNGS AND PLEURA: No focal pulmonary opacity. No pulmonary edema. No pleural effusion. No pneumothorax. HEART AND MEDIASTINUM: No acute abnormality of the cardiac and mediastinal silhouettes. BONES AND SOFT TISSUES: No acute osseous abnormality. IMPRESSION: 1. No acute process. Electronically signed by: Norleen Boxer MD 01/03/2024 02:32 PM EDT RP Workstation: [id number]      PROCEDURES:  Critical Care performed: No  Procedures   MEDICATIONS ORDERED IN ED: Medications  cefTRIAXone  (ROCEPHIN ) 1 g in sodium chloride  0.9 % 100 mL IVPB (0 g Intravenous Stopped 01/03/24 1448)  sodium chloride  0.9 % bolus 500 mL (0 mLs Intravenous Stopped 01/03/24 1509)     IMPRESSION / MDM / ASSESSMENT AND PLAN / ED COURSE  I reviewed the triage vital signs and the nursing notes.                              Differential diagnosis includes, but is not limited to, generalized weakness perhaps mild dehydration fatigue etc.  No acute central neurologic symptoms.  It seems daily she has generalized weakness fatigue poor appetite.  About 2 weeks ago with this she thought she was getting urinary tract infection reports she still has the same symptoms just, feeling fatigued more tired than typical.  No focal neurologic findings no strokelike symptoms.  Doubt central neurologic cause.  She is very pleasant in no distress.  Vital signs reassuring  Blood pressure is slightly low but not severely diminished.  She reports that her doctor had considered taking her off her blood pressure medicine recently.  I think at  this point to be reasonable for her to discontinue its use.  She does not have evidence of overt UTI at this time, but given recent UTI with E. coli resistant to Cipro for which she used ciprofloxacin for treatment I suspect some lingering element of infection may be present.  Sent for additional culture will treat with cefpodoxime.  Patient's presentation is most consistent with acute complicated  illness / injury requiring diagnostic workup.      Clinical Course as of 01/03/24 1545  Wed Jan 03, 2024  1336 Patient's husband reports that she took a course of ciprofloxacin after seeing the doctor recently.  To my review it appears that she has E. coli resistant to ciprofloxacin. [MQ]  1339 Reviewed culture data from [facility] system from 2 weeks ago with pharmacist well.  E. coli resistant to Cipro.  Advises ceftriaxone  appropriate.  Would be treatable with cefpodoxime.  She has had diarrhea with some Keflex  in the past, I do not see this is a contraindication to treatment with cefpodoxime [MQ]    Clinical Course User Index [MQ] Dicky Anes, MD   ----------------------------------------- 3:45 PM on 01/03/2024 -----------------------------------------   Vitals:   01/03/24 1510 01/03/24 1528  BP: 123/63 (!) 103/56  Pulse: 84 83  Resp: 18 17  Temp: 98.4 F (36.9 C)   SpO2: 98% 99%    Resting without distress.  Discussed plan of care with family daughter and also her son Dr. JAYSON Hasten.  Family will continue to follow her closely follow-up with PCP, hold Benzepril, and will treat with cefpodoxime.  Careful return precautions provided  Return precautions and treatment recommendations and follow-up discussed with the patient who is agreeable with the plan.   FINAL CLINICAL IMPRESSION(S) / ED DIAGNOSES   Final diagnoses:  Other fatigue  Generalized weakness  E. coli urinary tract infection     Rx / DC Orders   ED Discharge Orders          Ordered    cefpodoxime (VANTIN)  200 MG tablet  2 times daily,   Status:  Discontinued        01/03/24 1534    cefpodoxime (VANTIN) 200 MG tablet  2 times daily        01/03/24 1540             Note:  This document was prepared using Dragon voice recognition software and may include unintentional dictation errors.   Dicky Anes, MD 01/03/24 [phone]

## 2024-01-03 NOTE — Progress Notes (Signed)
   01/03/24 1300  Spiritual Encounters  Type of Visit Initial  Care provided to: Pt and family  Reason for visit Routine spiritual support  OnCall Visit Yes   Chaplain saw patient and family in the hallway and offered a compassionate presence and a warm blanket. Patient received the blanket and family said they didn't need any hydration or nourishment.  Family member complimented the way the ED was running smoothly for them.     Rev. Rana M. Nicholaus, M.Div. Chaplain Resident [facility]

## 2024-01-03 NOTE — ED Triage Notes (Signed)
 Pt to ED with husband for fatigue for several weeks. Pt has dementia and husband reports pt has been laying in bed more than normal and possibly has UTI. Denies urinary sx.

## 2024-01-03 NOTE — Discharge Instructions (Addendum)
 I believe your symptoms are likely from a partially treated urinary tract infection.  We will need to switch you to a different antibiotic and see if you have improvement  Please start cefpodoxime as prescribed.  Additionally, I recommend you stop your Benzepril for now.  Follow-up with Dr. Fernande in the coming week

## 2024-01-04 LAB — URINE CULTURE: Culture: <10000 — AB

## 2024-01-06 ENCOUNTER — Encounter: Payer: Self-pay | Admitting: Internal Medicine

## 2024-01-06 ENCOUNTER — Emergency Department

## 2024-01-06 ENCOUNTER — Other Ambulatory Visit: Payer: Self-pay

## 2024-01-06 ENCOUNTER — Inpatient Hospital Stay
Admission: EM | Admit: 2024-01-06 | Discharge: 2024-01-11 | DRG: 811 | Disposition: A | Discharge status: Skilled Nursing Facility | Attending: Student | Admitting: Student

## 2024-01-06 DIAGNOSIS — N179 Acute kidney failure, unspecified: Secondary | ICD-10-CM | POA: Diagnosis present

## 2024-01-06 DIAGNOSIS — E785 Hyperlipidemia, unspecified: Secondary | ICD-10-CM | POA: Diagnosis present

## 2024-01-06 DIAGNOSIS — Z8249 Family history of ischemic heart disease and other diseases of the circulatory system: Secondary | ICD-10-CM

## 2024-01-06 DIAGNOSIS — N189 Chronic kidney disease, unspecified: Secondary | ICD-10-CM | POA: Diagnosis present

## 2024-01-06 DIAGNOSIS — Z87440 Personal history of urinary (tract) infections: Secondary | ICD-10-CM

## 2024-01-06 DIAGNOSIS — I700 Atherosclerosis of aorta: Secondary | ICD-10-CM | POA: Diagnosis present

## 2024-01-06 DIAGNOSIS — I129 Hypertensive chronic kidney disease with stage 1 through stage 4 chronic kidney disease, or unspecified chronic kidney disease: Secondary | ICD-10-CM | POA: Diagnosis present

## 2024-01-06 DIAGNOSIS — G3184 Mild cognitive impairment, so stated: Secondary | ICD-10-CM | POA: Diagnosis present

## 2024-01-06 DIAGNOSIS — K253 Acute gastric ulcer without hemorrhage or perforation: Secondary | ICD-10-CM

## 2024-01-06 DIAGNOSIS — K922 Gastrointestinal hemorrhage, unspecified: Principal | ICD-10-CM

## 2024-01-06 DIAGNOSIS — N1831 Chronic kidney disease, stage 3a: Secondary | ICD-10-CM | POA: Diagnosis present

## 2024-01-06 DIAGNOSIS — E871 Hypo-osmolality and hyponatremia: Secondary | ICD-10-CM | POA: Diagnosis present

## 2024-01-06 DIAGNOSIS — I10 Essential (primary) hypertension: Secondary | ICD-10-CM | POA: Diagnosis present

## 2024-01-06 DIAGNOSIS — K2289 Other specified disease of esophagus: Secondary | ICD-10-CM | POA: Diagnosis present

## 2024-01-06 DIAGNOSIS — F32A Depression, unspecified: Secondary | ICD-10-CM | POA: Diagnosis present

## 2024-01-06 DIAGNOSIS — Z7984 Long term (current) use of oral hypoglycemic drugs: Secondary | ICD-10-CM

## 2024-01-06 DIAGNOSIS — D62 Acute posthemorrhagic anemia: Principal | ICD-10-CM | POA: Diagnosis present

## 2024-01-06 DIAGNOSIS — Z974 Presence of external hearing-aid: Secondary | ICD-10-CM

## 2024-01-06 DIAGNOSIS — G2581 Restless legs syndrome: Secondary | ICD-10-CM | POA: Diagnosis present

## 2024-01-06 DIAGNOSIS — K449 Diaphragmatic hernia without obstruction or gangrene: Secondary | ICD-10-CM | POA: Diagnosis present

## 2024-01-06 DIAGNOSIS — Z83438 Family history of other disorder of lipoprotein metabolism and other lipidemia: Secondary | ICD-10-CM

## 2024-01-06 DIAGNOSIS — D509 Iron deficiency anemia, unspecified: Secondary | ICD-10-CM | POA: Diagnosis present

## 2024-01-06 DIAGNOSIS — Z79899 Other long term (current) drug therapy: Secondary | ICD-10-CM

## 2024-01-06 DIAGNOSIS — E1129 Type 2 diabetes mellitus with other diabetic kidney complication: Secondary | ICD-10-CM | POA: Diagnosis present

## 2024-01-06 DIAGNOSIS — K254 Chronic or unspecified gastric ulcer with hemorrhage: Secondary | ICD-10-CM | POA: Diagnosis present

## 2024-01-06 DIAGNOSIS — E878 Other disorders of electrolyte and fluid balance, not elsewhere classified: Secondary | ICD-10-CM | POA: Insufficient documentation

## 2024-01-06 DIAGNOSIS — D72829 Elevated white blood cell count, unspecified: Secondary | ICD-10-CM | POA: Diagnosis present

## 2024-01-06 DIAGNOSIS — T39395A Adverse effect of other nonsteroidal anti-inflammatory drugs [NSAID], initial encounter: Secondary | ICD-10-CM | POA: Diagnosis present

## 2024-01-06 DIAGNOSIS — E872 Acidosis, unspecified: Secondary | ICD-10-CM | POA: Diagnosis present

## 2024-01-06 DIAGNOSIS — E1122 Type 2 diabetes mellitus with diabetic chronic kidney disease: Secondary | ICD-10-CM | POA: Diagnosis present

## 2024-01-06 DIAGNOSIS — R54 Age-related physical debility: Secondary | ICD-10-CM | POA: Diagnosis present

## 2024-01-06 DIAGNOSIS — Z791 Long term (current) use of non-steroidal anti-inflammatories (NSAID): Secondary | ICD-10-CM

## 2024-01-06 DIAGNOSIS — Z7982 Long term (current) use of aspirin: Secondary | ICD-10-CM

## 2024-01-06 DIAGNOSIS — Z887 Allergy status to serum and vaccine status: Secondary | ICD-10-CM

## 2024-01-06 DIAGNOSIS — J849 Interstitial pulmonary disease, unspecified: Secondary | ICD-10-CM | POA: Diagnosis present

## 2024-01-06 DIAGNOSIS — R6510 Systemic inflammatory response syndrome (SIRS) of non-infectious origin without acute organ dysfunction: Secondary | ICD-10-CM | POA: Diagnosis present

## 2024-01-06 LAB — CBC WITH DIFFERENTIAL/PLATELET
Abs Immature Granulocytes: 0.08 K/uL — ABNORMAL HIGH (ref 0.00–0.07)
Basophils Absolute: 0 K/uL (ref 0.0–0.1)
Basophils Relative: 0 %
Eosinophils Absolute: 0.5 K/uL (ref 0.0–0.5)
Eosinophils Relative: 4 %
HCT: 21 % — ABNORMAL LOW (ref 36.0–46.0)
Hemoglobin: 6.4 g/dL — ABNORMAL LOW (ref 12.0–15.0)
Immature Granulocytes: 1 %
Lymphocytes Relative: 19 %
Lymphs Abs: 2.6 K/uL (ref 0.7–4.0)
MCH: 29.2 pg (ref 26.0–34.0)
MCHC: 30.5 g/dL (ref 30.0–36.0)
MCV: 95.9 fL (ref 80.0–100.0)
Monocytes Absolute: 1.3 K/uL — ABNORMAL HIGH (ref 0.1–1.0)
Monocytes Relative: 9 %
Neutro Abs: 9.4 K/uL — ABNORMAL HIGH (ref 1.7–7.7)
Neutrophils Relative %: 67 %
Platelets: 394 K/uL (ref 150–400)
RBC: 2.19 MIL/uL — ABNORMAL LOW (ref 3.87–5.11)
RDW: 13.2 % (ref 11.5–15.5)
WBC: 13.9 K/uL — ABNORMAL HIGH (ref 4.0–10.5)
nRBC: 0 % (ref 0.0–0.2)

## 2024-01-06 LAB — PREPARE RBC (CROSSMATCH)

## 2024-01-06 LAB — COMPREHENSIVE METABOLIC PANEL WITH GFR
ALT: 15 U/L (ref 0–44)
AST: 25 U/L (ref 15–41)
Albumin: 2.8 g/dL — ABNORMAL LOW (ref 3.5–5.0)
Alkaline Phosphatase: 65 U/L (ref 38–126)
Anion gap: 13 (ref 5–15)
BUN: 76 mg/dL — ABNORMAL HIGH (ref 8–23)
CO2: 19 mmol/L — ABNORMAL LOW (ref 22–32)
Calcium: 8.3 mg/dL — ABNORMAL LOW (ref 8.9–10.3)
Chloride: 100 mmol/L (ref 98–111)
Creatinine, Ser: 1.05 mg/dL — ABNORMAL HIGH (ref 0.44–1.00)
GFR, Estimated: 51 mL/min — ABNORMAL LOW (ref >60)
Glucose, Bld: 163 mg/dL — ABNORMAL HIGH (ref 70–99)
Potassium: 4.5 mmol/L (ref 3.5–5.1)
Sodium: 132 mmol/L — ABNORMAL LOW (ref 135–145)
Total Bilirubin: 0.3 mg/dL (ref 0.0–1.2)
Total Protein: 6.1 g/dL — ABNORMAL LOW (ref 6.5–8.1)

## 2024-01-06 LAB — URINALYSIS, W/ REFLEX TO CULTURE (INFECTION SUSPECTED)
Bacteria, UA: NONE SEEN
Bilirubin Urine: NEGATIVE
Glucose, UA: NEGATIVE mg/dL
Hgb urine dipstick: NEGATIVE
Ketones, ur: NEGATIVE mg/dL
Leukocytes,Ua: NEGATIVE
Nitrite: NEGATIVE
Protein, ur: NEGATIVE mg/dL
Specific Gravity, Urine: 1.016 (ref 1.005–1.030)
pH: 5 (ref 5.0–8.0)

## 2024-01-06 LAB — TSH: TSH: 2.681 u[IU]/mL (ref 0.350–4.500)

## 2024-01-06 LAB — RESP PANEL BY RT-PCR (RSV, FLU A&B, COVID)  RVPGX2
Influenza A by PCR: NEGATIVE
Influenza B by PCR: NEGATIVE
Resp Syncytial Virus by PCR: NEGATIVE
SARS Coronavirus 2 by RT PCR: NEGATIVE

## 2024-01-06 LAB — CBG MONITORING, ED
Glucose-Capillary: 129 mg/dL — ABNORMAL HIGH (ref 70–99)
Glucose-Capillary: 103 mg/dL — ABNORMAL HIGH (ref 70–99)

## 2024-01-06 LAB — PROTIME-INR
INR: 1 (ref 0.8–1.2)
Prothrombin Time: 14.1 s (ref 11.4–15.2)

## 2024-01-06 LAB — LACTIC ACID, PLASMA
Lactic Acid, Venous: 2.6 mmol/L (ref 0.5–1.9)
Lactic Acid, Venous: 1.7 mmol/L (ref 0.5–1.9)

## 2024-01-06 LAB — ABO/RH: ABO/RH(D): O POS

## 2024-01-06 MED ORDER — ONDANSETRON HCL 4 MG PO TABS: 4.0000 mg | ORAL_TABLET | Freq: Four times a day (QID) | ORAL | Status: DC | PRN

## 2024-01-06 MED ORDER — ACETAMINOPHEN 325 MG PO TABS: 650.0000 mg | ORAL_TABLET | Freq: Four times a day (QID) | ORAL | Status: DC | PRN

## 2024-01-06 MED ORDER — PANTOPRAZOLE SODIUM 40 MG IV SOLR
40.0000 mg | Freq: Two times a day (BID) | INTRAVENOUS | Status: DC
  Administered 2024-01-06 – 2024-01-11 (×10): 40 mg via INTRAVENOUS
  Filled 2024-01-06 (×10): qty 10

## 2024-01-06 MED ORDER — ONDANSETRON HCL 4 MG/2ML IJ SOLN: 4.0000 mg | Freq: Four times a day (QID) | INTRAMUSCULAR | Status: DC | PRN

## 2024-01-06 MED ORDER — SODIUM CHLORIDE 0.9 % IV BOLUS
1000.0000 mL | Freq: Once | INTRAVENOUS | Status: AC
  Administered 2024-01-06: 1000 mL via INTRAVENOUS

## 2024-01-06 MED ORDER — HYDROCODONE-ACETAMINOPHEN 5-325 MG PO TABS
1.0000 | ORAL_TABLET | ORAL | Status: DC | PRN
  Administered 2024-01-07: 1 via ORAL
  Administered 2024-01-08: 2 via ORAL
  Administered 2024-01-09 – 2024-01-11 (×5): 1 via ORAL
  Filled 2024-01-06 (×3): qty 1
  Filled 2024-01-06: qty 2
  Filled 2024-01-06 (×3): qty 1

## 2024-01-06 MED ORDER — SODIUM CHLORIDE 0.9 % IV SOLN: 10.0000 mL/h | Freq: Once | INTRAVENOUS | Status: DC

## 2024-01-06 MED ORDER — ACETAMINOPHEN 650 MG RE SUPP
650.0000 mg | Freq: Four times a day (QID) | RECTAL | Status: AC | PRN
Start: 2024-01-06 — End: ?

## 2024-01-06 MED ORDER — LORAZEPAM 0.5 MG PO TABS
0.5000 mg | ORAL_TABLET | Freq: Once | ORAL | Status: AC | PRN
  Administered 2024-01-06: 0.5 mg via ORAL
  Filled 2024-01-06: qty 1

## 2024-01-06 MED ORDER — INSULIN ASPART 100 UNIT/ML IJ SOLN
0.0000 [IU] | INTRAMUSCULAR | Status: DC
  Administered 2024-01-06 – 2024-01-08 (×2): 1 [IU] via SUBCUTANEOUS
  Administered 2024-01-08: 2 [IU] via SUBCUTANEOUS
  Administered 2024-01-09 – 2024-01-10 (×4): 1 [IU] via SUBCUTANEOUS
  Administered 2024-01-10 – 2024-01-11 (×3): 2 [IU] via SUBCUTANEOUS
  Administered 2024-01-11 (×2): 1 [IU] via SUBCUTANEOUS
  Filled 2024-01-06 (×12): qty 1

## 2024-01-06 MED ORDER — LORAZEPAM 0.5 MG PO TABS
0.5000 mg | ORAL_TABLET | Freq: Once | ORAL | Status: AC
  Administered 2024-01-06: 0.5 mg via ORAL
  Filled 2024-01-06: qty 1

## 2024-01-06 MED ORDER — ALBUTEROL SULFATE (2.5 MG/3ML) 0.083% IN NEBU: 2.5000 mg | INHALATION_SOLUTION | RESPIRATORY_TRACT | Status: DC | PRN

## 2024-01-06 MED ORDER — SODIUM CHLORIDE 0.9 % IV BOLUS
500.0000 mL | Freq: Once | INTRAVENOUS | Status: AC
  Administered 2024-01-06: 500 mL via INTRAVENOUS

## 2024-01-06 MED ORDER — PANTOPRAZOLE SODIUM 40 MG IV SOLR
40.0000 mg | Freq: Once | INTRAVENOUS | Status: AC
  Administered 2024-01-06: 40 mg via INTRAVENOUS
  Filled 2024-01-06: qty 10

## 2024-01-06 MED ORDER — MORPHINE SULFATE (PF) 2 MG/ML IV SOLN
2.0000 mg | INTRAVENOUS | Status: DC | PRN
  Administered 2024-01-08: 2 mg via INTRAVENOUS
  Filled 2024-01-06: qty 1

## 2024-01-06 MED ORDER — PAROXETINE HCL 20 MG PO TABS
40.0000 mg | ORAL_TABLET | Freq: Every day | ORAL | Status: DC
  Administered 2024-01-06 – 2024-01-11 (×6): 40 mg via ORAL
  Filled 2024-01-06 (×6): qty 2

## 2024-01-06 NOTE — Assessment & Plan Note (Addendum)
 Not overtly symptomatic Followed by pulmonology Albuterol  as needed

## 2024-01-06 NOTE — ED Triage Notes (Signed)
 Pt to ED BIB [facility] with c/o weakness. Pt currently being treated for a UTI. Noted to be tachycardic with poor skin turgor. Denies fever, denies n/v/d.

## 2024-01-06 NOTE — Assessment & Plan Note (Signed)
 Holding aspirin due to GI bleed

## 2024-01-06 NOTE — Assessment & Plan Note (Signed)
 Patient currently not taking amlodipine 5 mg daily, benazepril  20 mg daily, hydrochlorothiazide 25 mg daily As such, these were not resumed during hospitalization Hydralazine  5 mg IV every 6 hours as needed for SBP greater 165, 5 days ordered

## 2024-01-06 NOTE — ED Notes (Signed)
 Blood consent signed on physical copy. Put in pt records.

## 2024-01-06 NOTE — H&P (Signed)
 History and Physical    Patient: [name] FMW:[id number] DOB: 01/01/1933 DOA: 01/06/2024 DOS: the patient was seen and examined on 01/06/2024 PCP: [name], MD  Patient coming from: Home  Chief Complaint: weakness  HPI: [name] is a 89 y.o. female with medical history significant for HTN, DM, depression, CKD stage IIIa, mild cognitive impairment, interstitial lung disease being admitted with upper GI bleed associated with acute blood loss anemia, after presenting to the ED for the second time in 3 days with weakness.  On her first visit on 10/8 hemoglobin was 9.7 (baseline 10.4) with an otherwise reassuring workup.  Due to persistent symptoms, she again presented today with hemoglobin now 6.4 down from recent 9.7.  She denies blood in the stool or black stool,.  Endorses nausea but denies vomiting or abdominal pain. She is currently on aspirin and meloxicam. No EGD on record/no colonoscopy since around 2012.  She denies chest pain or shortness of breath, palpitations. In the ED, tachycardic to the 120s mildly tachypneic to low 20s but with good blood pressures in the 120s to 130s.  O2 sat in the high 90s on room air and afebrile. Labs notable for hemoglobin 6.4 and stool guaiac showing dark blood and positive Hemoccult.   WBC 14,000 and lactic acid 2.6 with normal UA and negative respiratory viral panel. Renal function at baseline but with mild metabolic acidosis of 19, hyponatremia of 132 and normal potassium.  LFTs WNL. EKG showing sinus tachycardia at 114 Chest x-ray nonacute showing chronic disease Patient given an NS bolus and started on a unit of blood The ED provider: Spoke with gastroenterologist, Dr. Aundria who will see patient in the a.m. Admission requested.     Review of Systems: As mentioned in the history of present illness. All other systems reviewed and are negative.  Past Medical History:  Diagnosis Date   Adenomatous polyps    on colonoscopy    Arthritis    Essential hypertension, benign    Insomnia    Menopausal syndrome    Other and unspecified hyperlipidemia    Type II or unspecified type diabetes mellitus without mention of complication, not stated as uncontrolled    Wears hearing aid in both ears    Past Surgical History:  Procedure Laterality Date   CATARACT EXTRACTION W/PHACO Right 08/03/2022   Procedure: CATARACT EXTRACTION PHACO AND INTRAOCULAR LENS PLACEMENT (IOC) RIGHT DIABETIC 22.93 1.42.1;  Surgeon: Mittie Gaskin, MD;  Location: [facility];  Service: Ophthalmology;  Laterality: Right;  Diabetic   MENISCUS REPAIR     Social History:  reports that she has never smoked. She has never used smokeless tobacco. She reports current alcohol use of about 7.0 standard drinks of alcohol per week. She reports that she does not use drugs.  Allergies  Allergen Reactions   Tetanus Antitoxin Rash and Shortness Of Breath   Tetanus Toxoid-Containing Vaccines Shortness Of Breath and Rash    Family History  Problem Relation Age of Onset   Heart disease Mother    Hyperlipidemia Mother    Hypertension Mother    Breast cancer Neg Hx     Prior to Admission medications   Medication Sig Start Date End Date Taking? Authorizing Provider  aspirin 81 MG EC tablet Take 81 mg by mouth daily.    [provider]  Beclomethasone Dipropionate (QNASL) 80 MCG/ACT AERS Place 2 sprays into the nose 2 (two) times daily as needed.    [provider]  cefpodoxime (VANTIN) 200 MG tablet Take 0.5 tablets (100 mg total) by mouth 2 (two) times daily. 01/03/24   Dicky Anes, MD  ezetimibe  (ZETIA ) 10 MG tablet Take 1 tablet (10 mg total) by mouth daily. 05/30/23   Gollan, Timothy J, MD  fexofenadine (ALLEGRA) 180 MG tablet Take 180 mg by mouth at bedtime.    [provider]  meloxicam (MOBIC) 15 MG tablet Take 15 mg by mouth daily. 07/23/23   [provider]  metFORMIN (GLUCOPHAGE) 500 MG tablet Take 500 mg by  mouth 2 (two) times daily. 11/05/21   [provider]  Multiple Vitamin (MULTI-VITAMIN) tablet Take 1 tablet by mouth daily.     [provider]  PARoxetine (PAXIL) 40 MG tablet Take 40 mg by mouth daily. 10/03/23 10/02/24  [provider]  simvastatin (ZOCOR) 40 MG tablet Take 40 mg by mouth at bedtime.    [provider]    Physical Exam: Vitals:   01/06/24 1818 01/06/24 1830 01/06/24 1930 01/06/24 1942  BP:  (!) 137/59 (!) 120/57 (!) 120/57  Pulse:  (!) 120 (!) 120 (!) 116  Resp:  (!) 23 (!) 22 (!) 23  Temp:    98 F (36.7 C)  TempSrc:    Oral  SpO2: 97% 97% 100% 100%  Weight:      Height:       Physical Exam Vitals and nursing note reviewed.  Constitutional:      General: She is not in acute distress. HENT:     Head: Normocephalic and atraumatic.  Cardiovascular:     Rate and Rhythm: Regular rhythm. Tachycardia present.     Heart sounds: Normal heart sounds.  Pulmonary:     Effort: Tachypnea present.     Breath sounds: Normal breath sounds.  Abdominal:     Palpations: Abdomen is soft.     Tenderness: There is no abdominal tenderness.  Neurological:     Mental Status: Mental status is at baseline.     Labs on Admission: I have personally reviewed following labs and imaging studies  CBC: Recent Labs  Lab 01/03/24 1123 01/06/24 1759  WBC 12.4* 13.9*  NEUTROABS  --  9.4*  HGB 9.7* 6.4*  HCT 30.4* 21.0*  MCV 91.8 95.9  PLT 457* 394   Basic Metabolic Panel: Recent Labs  Lab 01/03/24 1123 01/06/24 1759  NA 135 132*  K 4.7 4.5  CL 99 100  CO2 24 19*  GLUCOSE 115* 163*  BUN 35* 76*  CREATININE 1.06* 1.05*  CALCIUM 9.6 8.3*   GFR: Estimated Creatinine Clearance: 34.3 mL/min (A) (by C-G formula based on SCr of 1.05 mg/dL (H)). Liver Function Tests: Recent Labs  Lab 01/03/24 1123 01/06/24 1759  AST 25 25  ALT 19 15  ALKPHOS 78 65  BILITOT 0.5 0.3  PROT 7.2 6.1*  ALBUMIN 3.4* 2.8*   No results for input(s):  LIPASE, AMYLASE in the last 168 hours. No results for input(s): AMMONIA in the last 168 hours. Coagulation Profile: Recent Labs  Lab 01/06/24 1759  INR 1.0   Cardiac Enzymes: No results for input(s): CKTOTAL, CKMB, CKMBINDEX, TROPONINI in the last 168 hours. BNP (last 3 results) No results for input(s): PROBNP in the last 8760 hours. HbA1C: No results for input(s): HGBA1C in the last 72 hours. CBG: No results for input(s): GLUCAP in the last 168 hours. Lipid Profile: No results for input(s): CHOL, HDL, LDLCALC, TRIG, CHOLHDL, LDLDIRECT in the last 72 hours. Thyroid Function Tests: No  results for input(s): TSH, T4TOTAL, FREET4, T3FREE, THYROIDAB in the last 72 hours. Anemia Panel: No results for input(s): VITAMINB12, FOLATE, FERRITIN, TIBC, IRON, RETICCTPCT in the last 72 hours. Urine analysis:    Component Value Date/Time   COLORURINE YELLOW (A) 01/06/2024 1759   APPEARANCEUR CLEAR (A) 01/06/2024 1759   LABSPEC 1.016 01/06/2024 1759   PHURINE 5.0 01/06/2024 1759   GLUCOSEU NEGATIVE 01/06/2024 1759   HGBUR NEGATIVE 01/06/2024 1759   BILIRUBINUR NEGATIVE 01/06/2024 1759   KETONESUR NEGATIVE 01/06/2024 1759   PROTEINUR NEGATIVE 01/06/2024 1759   NITRITE NEGATIVE 01/06/2024 1759   LEUKOCYTESUR NEGATIVE 01/06/2024 1759    Radiological Exams on Admission: DG Chest Port 1 View Result Date: 01/06/2024 CLINICAL DATA:  Questionable sepsis - evaluate for abnormality EXAM: PORTABLE CHEST 1 VIEW COMPARISON:  01/03/2024 FINDINGS: Heart and mediastinal contours are within normal limits. Aortic atherosclerosis. Peripheral densities in the upper lobes compatible with chronic lung disease/fibrosis. No acute confluent airspace opacities or effusions. No acute bony abnormality. IMPRESSION: Chronic changes.  No active disease. Electronically Signed   By: Franky Crease M.D.   On: 01/06/2024 18:26   Data Reviewed for HPI: Relevant notes from  primary care and specialist visits, past discharge summaries as available in EHR, including Care Everywhere. Prior diagnostic testing as pertinent to current admission diagnoses Updated medications and problem lists for reconciliation ED course, including vitals, labs, imaging, treatment and response to treatment Triage notes, nursing and pharmacy notes and ED provider's notes Notable results as noted above in HPI      Assessment and Plan: * Upper GI bleeding, suspect acute on chronic Acute blood loss anemia Lactic acidosis with SIRS  Possible NSAID induced gastropathy Patient presenting with progressive weakness, hemoglobin drop in 3 days from 9.7-6.4 Lactic acidosis/leukocytosis/SIRS criteria  likely related to tissue hypoperfusion from GI bleed Received an IV fluid bolus in the ED Continue transfusion of up to 2 unit PRBCs with serial H&H thereafter Additional IV hydration to maintain adequate perfusion N.p.o. except for ice chips Protonix IV twice daily, antiemetics Hold aspirin and meloxicam GI aware and formally consulted Close hemodynamic monitoring in progressive care unit for signs of bleeding/decompensation  SIRS with lactic acidosis (systemic inflammatory response syndrome) (HCC) Tachycardia, tachypnea, leukocytosis and lactic acidosis At this time believe related to GI bleed Low suspicion for infection.  Chest x-ray and UA and respiratory viral panel negative No reports of change in bowel habits Will consider getting CT abdomen based on results of treatment  HTN (hypertension) Holding antihypertensives in the setting of acute GI bleed  ILD (interstitial lung disease) (HCC) Not overtly symptomatic Followed by pulmonology Albuterol  as needed  Chronic kidney disease, stage 3a (HCC) Renal function at baseline  Type II diabetes mellitus with renal manifestations (HCC) Sliding scale insulin  coverage  Depression Continue paroxetine with lorazepam as  needed  Electrolyte abnormality Mild hyponatremia of 132, possibly hypovolemic Expecting improvement with IV hydration  Aortic atherosclerosis Holding aspirin due to GI bleed        DVT prophylaxis: SCDs  Consults: GI GI  Advance Care Planning:   Code Status: Prior   Family Communication: none  Disposition Plan: Back to previous home environment  Severity of Illness: The appropriate patient status for this patient is OBSERVATION. Observation status is judged to be reasonable and necessary in order to provide the required intensity of service to ensure the patient's safety. The patient's presenting symptoms, physical exam findings, and initial radiographic and laboratory data in the context of their medical  condition is felt to place them at decreased risk for further clinical deterioration. Furthermore, it is anticipated that the patient will be medically stable for discharge from the hospital within 2 midnights of admission.   Author: Delayne LULLA Solian, MD 01/06/2024 8:14 PM  For on call review [email or web address].

## 2024-01-06 NOTE — Assessment & Plan Note (Addendum)
 Acute blood loss anemia Lactic acidosis with SIRS  Possible NSAID induced gastropathy Patient presenting with progressive weakness, hemoglobin drop in 3 days from 9.7-6.4 Lactic acidosis/leukocytosis/SIRS criteria  likely related to tissue hypoperfusion from GI bleed Received an IV fluid bolus in the ED Continue transfusion of up to 2 unit PRBCs with serial H&H thereafter Additional IV hydration to maintain adequate perfusion N.p.o. except for ice chips Protonix IV twice daily, antiemetics Hold aspirin and meloxicam GI aware and formally consulted Close hemodynamic monitoring in progressive care unit for signs of bleeding/decompensation

## 2024-01-06 NOTE — Assessment & Plan Note (Signed)
 Tachycardia, tachypnea, leukocytosis and lactic acidosis At this time believe related to GI bleed Low suspicion for infection.  Chest x-ray and UA and respiratory viral panel negative No reports of change in bowel habits Will consider getting CT abdomen based on results of treatment

## 2024-01-06 NOTE — ED Notes (Signed)
 Patient tolerating blood transfusion well, no signs or symptoms of allergic reaction.

## 2024-01-06 NOTE — Assessment & Plan Note (Signed)
 Continue paroxetine with lorazepam as needed

## 2024-01-06 NOTE — Assessment & Plan Note (Signed)
 Sliding scale insulin  coverage

## 2024-01-06 NOTE — Assessment & Plan Note (Signed)
 Mild hyponatremia of 132, possibly hypovolemic Expecting improvement with IV hydration

## 2024-01-06 NOTE — ED Notes (Signed)
 Pt used bedpan without trouble

## 2024-01-06 NOTE — ED Provider Notes (Signed)
 [facility] Provider Note    Event Date/Time   First MD Initiated Contact with Patient 01/06/24 1750     (approximate)   History   Chief Complaint: Generalized weakness  HPI  [name] is a 89 y.o. female with a diabetes, hypertension who comes ED complaining of generalized weakness, worsening for the past few days.  Recently treated for UTI.  EMS noted heart rate of 120, respiratory rate of about 25, normal blood pressure.  They gave 500 cc IV fluids prior to arrival.  Patient denies nausea vomiting diarrhea black or bloody stool or pain.        Past Medical History:  Diagnosis Date   Adenomatous polyps    on colonoscopy   Arthritis    Essential hypertension, benign    Insomnia    Menopausal syndrome    Other and unspecified hyperlipidemia    Type II or unspecified type diabetes mellitus without mention of complication, not stated as uncontrolled    Wears hearing aid in both ears     Current Outpatient Rx   Order #: [id number] Class: Historical Med   Order #: [id number] Class: Historical Med   Order #: [id number] Class: Normal   Order #: [id number] Class: Normal   Order #: [id number] Class: Historical Med   Order #: [id number] Class: Historical Med   Order #: [id number] Class: Historical Med   Order #: [id number] Class: Historical Med   Order #: [id number] Class: Historical Med   Order #: [id number] Class: Historical Med    Past Surgical History:  Procedure Laterality Date   CATARACT EXTRACTION W/PHACO Right 08/03/2022   Procedure: CATARACT EXTRACTION PHACO AND INTRAOCULAR LENS PLACEMENT (IOC) RIGHT DIABETIC 22.93 1.42.1;  Surgeon: Mittie Gaskin, MD;  Location: [facility];  Service: Ophthalmology;  Laterality: Right;  Diabetic   MENISCUS REPAIR      Physical Exam   Triage Vital Signs: ED Triage Vitals [01/06/24 1748]  Encounter Vitals Group     BP      Girls Systolic BP Percentile      Girls Diastolic BP Percentile      Boys  Systolic BP Percentile      Boys Diastolic BP Percentile      Pulse      Resp      Temp      Temp src      SpO2 97 %     Weight      Height      Head Circumference      Peak Flow      Pain Score      Pain Loc      Pain Education      Exclude from Growth Chart     Most recent vital signs: Vitals:   01/06/24 1930 01/06/24 1942  BP: (!) 120/57 (!) 120/57  Pulse: (!) 120 (!) 116  Resp: (!) 22 (!) 23  Temp:  98 F (36.7 C)  SpO2: 100% 100%    General: Awake, no distress.  CV:  Good peripheral perfusion.  Tachycardia heart rate 120 Resp:  Normal effort.  Clear lungs Abd:  No distention.  Soft nontender.  DRE reveals dark stool, Hemoccult positive Other:  Conjunctival pallor.  Dry oral mucosa.  No rash or wounds, no soft tissue inflammatory changes.   ED Results / Procedures / Treatments   Labs (all labs ordered are listed, but only abnormal results are displayed) Labs Reviewed  LACTIC ACID, PLASMA - Abnormal; Notable for the following components:  Result Value   Lactic Acid, Venous 2.6 (*)    All other components within normal limits  COMPREHENSIVE METABOLIC PANEL WITH GFR - Abnormal; Notable for the following components:   Sodium 132 (*)    CO2 19 (*)    Glucose, Bld 163 (*)    BUN 76 (*)    Creatinine, Ser 1.05 (*)    Calcium 8.3 (*)    Total Protein 6.1 (*)    Albumin 2.8 (*)    GFR, Estimated 51 (*)    All other components within normal limits  CBC WITH DIFFERENTIAL/PLATELET - Abnormal; Notable for the following components:   WBC 13.9 (*)    RBC 2.19 (*)    Hemoglobin 6.4 (*)    HCT 21.0 (*)    Neutro Abs 9.4 (*)    Monocytes Absolute 1.3 (*)    Abs Immature Granulocytes 0.08 (*)    All other components within normal limits  URINALYSIS, W/ REFLEX TO CULTURE (INFECTION SUSPECTED) - Abnormal; Notable for the following components:   Color, Urine YELLOW (*)    APPearance CLEAR (*)    All other components within normal limits  RESP PANEL BY RT-PCR  (RSV, FLU A&B, COVID)  RVPGX2  CULTURE, BLOOD (ROUTINE X 2)  CULTURE, BLOOD (ROUTINE X 2)  PROTIME-INR  LACTIC ACID, PLASMA  TSH  TYPE AND SCREEN  PREPARE RBC (CROSSMATCH)  ABO/RH     EKG Interpreted by me Sinus tachycardia rate 114.  Normal axis and intervals.  Poor R wave progression.  No acute ischemic changes.   RADIOLOGY Chest x-ray interpreted by me, normal.  Radiology report reviewed   PROCEDURES:  .Critical Care  Performed by: Viviann Pastor, MD Authorized by: Viviann Pastor, MD   Critical care provider statement:    Critical care time (minutes):  35   Critical care time was exclusive of:  Separately billable procedures and treating other patients   Critical care was necessary to treat or prevent imminent or life-threatening deterioration of the following conditions:  Circulatory failure and shock   Critical care was time spent personally by me on the following activities:  Development of treatment plan with patient or surrogate, discussions with consultants, evaluation of patient's response to treatment, examination of patient, obtaining history from patient or surrogate, ordering and performing treatments and interventions, ordering and review of laboratory studies, ordering and review of radiographic studies, pulse oximetry, re-evaluation of patient's condition and review of old charts   Care discussed with: admitting provider      MEDICATIONS ORDERED IN ED: Medications  0.9 %  sodium chloride  infusion (has no administration in time range)  sodium chloride  0.9 % bolus 1,000 mL (1,000 mLs Intravenous New Bag/Given 01/06/24 1803)  pantoprazole (PROTONIX) injection 40 mg (40 mg Intravenous Given 01/06/24 1856)  LORazepam (ATIVAN) tablet 0.5 mg (0.5 mg Oral Given 01/06/24 1854)     IMPRESSION / MDM / ASSESSMENT AND PLAN / ED COURSE  I reviewed the triage vital signs and the nursing notes.  DDx: Anemia, AKI, dehydration, electrolyte derangement, metabolic  acidosis, UTI, pneumonia, COVID, influenza  Patient's presentation is most consistent with acute presentation with potential threat to life or bodily function.  Patient presents with weakness, tachycardia.  Afebrile.  Will give IV fluids while checking lab workup.   ----------------------------------------- 6:39 PM on 01/06/2024 ----------------------------------------- Labs reveal hemoglobin of 6.4, acutely decreased from baseline of about 10.  Hemoccult positive stool worrisome for upper GI bleed.  Blood pressure remains normal.  Will give  Protonix, start RBC transfusion for hemodynamic support, plan to admit.   ----------------------------------------- 7:54 PM on 01/06/2024 ----------------------------------------- Case discussed with hospitalist.  GI aware as well.     FINAL CLINICAL IMPRESSION(S) / ED DIAGNOSES   Final diagnoses:  UGIB (upper gastrointestinal bleed)  Acute blood loss anemia     Rx / DC Orders   ED Discharge Orders     None        Note:  This document was prepared using Dragon voice recognition software and may include unintentional dictation errors.   Viviann Pastor, MD 01/06/24 [phone]

## 2024-01-06 NOTE — Assessment & Plan Note (Signed)
 -  Renal function  at baseline

## 2024-01-06 NOTE — ED Notes (Signed)
 Assisted Dr. Viviann with rectal exam

## 2024-01-07 DIAGNOSIS — D509 Iron deficiency anemia, unspecified: Secondary | ICD-10-CM | POA: Diagnosis present

## 2024-01-07 DIAGNOSIS — N189 Chronic kidney disease, unspecified: Secondary | ICD-10-CM | POA: Diagnosis present

## 2024-01-07 DIAGNOSIS — D72829 Elevated white blood cell count, unspecified: Secondary | ICD-10-CM | POA: Diagnosis present

## 2024-01-07 DIAGNOSIS — Z791 Long term (current) use of non-steroidal anti-inflammatories (NSAID): Secondary | ICD-10-CM | POA: Diagnosis not present

## 2024-01-07 DIAGNOSIS — Z7982 Long term (current) use of aspirin: Secondary | ICD-10-CM | POA: Diagnosis not present

## 2024-01-07 DIAGNOSIS — K921 Melena: Secondary | ICD-10-CM | POA: Diagnosis not present

## 2024-01-07 DIAGNOSIS — N179 Acute kidney failure, unspecified: Secondary | ICD-10-CM | POA: Diagnosis present

## 2024-01-07 DIAGNOSIS — E785 Hyperlipidemia, unspecified: Secondary | ICD-10-CM | POA: Diagnosis present

## 2024-01-07 DIAGNOSIS — K922 Gastrointestinal hemorrhage, unspecified: Secondary | ICD-10-CM | POA: Diagnosis not present

## 2024-01-07 DIAGNOSIS — J849 Interstitial pulmonary disease, unspecified: Secondary | ICD-10-CM | POA: Diagnosis present

## 2024-01-07 DIAGNOSIS — I700 Atherosclerosis of aorta: Secondary | ICD-10-CM | POA: Diagnosis present

## 2024-01-07 DIAGNOSIS — I129 Hypertensive chronic kidney disease with stage 1 through stage 4 chronic kidney disease, or unspecified chronic kidney disease: Secondary | ICD-10-CM | POA: Diagnosis present

## 2024-01-07 DIAGNOSIS — K449 Diaphragmatic hernia without obstruction or gangrene: Secondary | ICD-10-CM | POA: Diagnosis not present

## 2024-01-07 DIAGNOSIS — K2289 Other specified disease of esophagus: Secondary | ICD-10-CM | POA: Diagnosis present

## 2024-01-07 DIAGNOSIS — Z7984 Long term (current) use of oral hypoglycemic drugs: Secondary | ICD-10-CM | POA: Diagnosis not present

## 2024-01-07 DIAGNOSIS — R6510 Systemic inflammatory response syndrome (SIRS) of non-infectious origin without acute organ dysfunction: Secondary | ICD-10-CM | POA: Diagnosis present

## 2024-01-07 DIAGNOSIS — K259 Gastric ulcer, unspecified as acute or chronic, without hemorrhage or perforation: Secondary | ICD-10-CM | POA: Diagnosis not present

## 2024-01-07 DIAGNOSIS — E1122 Type 2 diabetes mellitus with diabetic chronic kidney disease: Secondary | ICD-10-CM | POA: Diagnosis present

## 2024-01-07 DIAGNOSIS — Z887 Allergy status to serum and vaccine status: Secondary | ICD-10-CM | POA: Diagnosis not present

## 2024-01-07 DIAGNOSIS — K254 Chronic or unspecified gastric ulcer with hemorrhage: Secondary | ICD-10-CM | POA: Diagnosis present

## 2024-01-07 DIAGNOSIS — Z79899 Other long term (current) drug therapy: Secondary | ICD-10-CM | POA: Diagnosis not present

## 2024-01-07 DIAGNOSIS — D62 Acute posthemorrhagic anemia: Secondary | ICD-10-CM | POA: Diagnosis present

## 2024-01-07 DIAGNOSIS — G2581 Restless legs syndrome: Secondary | ICD-10-CM | POA: Diagnosis present

## 2024-01-07 DIAGNOSIS — E871 Hypo-osmolality and hyponatremia: Secondary | ICD-10-CM | POA: Diagnosis present

## 2024-01-07 DIAGNOSIS — Z974 Presence of external hearing-aid: Secondary | ICD-10-CM | POA: Diagnosis not present

## 2024-01-07 DIAGNOSIS — T39395A Adverse effect of other nonsteroidal anti-inflammatory drugs [NSAID], initial encounter: Secondary | ICD-10-CM | POA: Diagnosis present

## 2024-01-07 DIAGNOSIS — F32A Depression, unspecified: Secondary | ICD-10-CM | POA: Diagnosis present

## 2024-01-07 DIAGNOSIS — E872 Acidosis, unspecified: Secondary | ICD-10-CM | POA: Diagnosis present

## 2024-01-07 LAB — CBG MONITORING, ED
Glucose-Capillary: 111 mg/dL — ABNORMAL HIGH (ref 70–99)
Glucose-Capillary: 111 mg/dL — ABNORMAL HIGH (ref 70–99)
Glucose-Capillary: 149 mg/dL — ABNORMAL HIGH (ref 70–99)
Glucose-Capillary: 110 mg/dL — ABNORMAL HIGH (ref 70–99)
Glucose-Capillary: 99 mg/dL (ref 70–99)

## 2024-01-07 LAB — BASIC METABOLIC PANEL WITH GFR
Anion gap: 12 (ref 5–15)
BUN: 39 mg/dL — ABNORMAL HIGH (ref 8–23)
CO2: 20 mmol/L — ABNORMAL LOW (ref 22–32)
Calcium: 8.1 mg/dL — ABNORMAL LOW (ref 8.9–10.3)
Chloride: 104 mmol/L (ref 98–111)
Creatinine, Ser: 0.87 mg/dL (ref 0.44–1.00)
GFR, Estimated: >60 mL/min (ref >60)
Glucose, Bld: 167 mg/dL — ABNORMAL HIGH (ref 70–99)
Potassium: 4.1 mmol/L (ref 3.5–5.1)
Sodium: 136 mmol/L (ref 135–145)

## 2024-01-07 LAB — CBC
HCT: 22.3 % — ABNORMAL LOW (ref 36.0–46.0)
HCT: 29.2 % — ABNORMAL LOW (ref 36.0–46.0)
HCT: 24.9 % — ABNORMAL LOW (ref 36.0–46.0)
Hemoglobin: 7.3 g/dL — ABNORMAL LOW (ref 12.0–15.0)
Hemoglobin: 9.7 g/dL — ABNORMAL LOW (ref 12.0–15.0)
Hemoglobin: 8.5 g/dL — ABNORMAL LOW (ref 12.0–15.0)
MCH: 30.3 pg (ref 26.0–34.0)
MCH: 30.6 pg (ref 26.0–34.0)
MCH: 30.5 pg (ref 26.0–34.0)
MCHC: 32.7 g/dL (ref 30.0–36.0)
MCHC: 33.2 g/dL (ref 30.0–36.0)
MCHC: 34.1 g/dL (ref 30.0–36.0)
MCV: 92.5 fL (ref 80.0–100.0)
MCV: 92.1 fL (ref 80.0–100.0)
MCV: 89.2 fL (ref 80.0–100.0)
Platelets: 320 K/uL (ref 150–400)
Platelets: 322 K/uL (ref 150–400)
Platelets: 340 K/uL (ref 150–400)
RBC: 2.41 MIL/uL — ABNORMAL LOW (ref 3.87–5.11)
RBC: 3.17 MIL/uL — ABNORMAL LOW (ref 3.87–5.11)
RBC: 2.79 MIL/uL — ABNORMAL LOW (ref 3.87–5.11)
RDW: 13.1 % (ref 11.5–15.5)
RDW: 13.2 % (ref 11.5–15.5)
RDW: 13.2 % (ref 11.5–15.5)
WBC: 10.6 K/uL — ABNORMAL HIGH (ref 4.0–10.5)
WBC: 9.6 K/uL (ref 4.0–10.5)
WBC: 8.8 K/uL (ref 4.0–10.5)
nRBC: 0 % (ref 0.0–0.2)
nRBC: 0 % (ref 0.0–0.2)
nRBC: 0 % (ref 0.0–0.2)

## 2024-01-07 LAB — PREPARE RBC (CROSSMATCH)

## 2024-01-07 LAB — HEMOGLOBIN A1C
Hgb A1c MFr Bld: 6.1 % — ABNORMAL HIGH (ref 4.8–5.6)
Mean Plasma Glucose: 128.37 mg/dL

## 2024-01-07 MED ORDER — METOPROLOL TARTRATE 5 MG/5ML IV SOLN
2.5000 mg | INTRAVENOUS | Status: DC | PRN
Start: 2024-01-07 — End: 2024-01-10

## 2024-01-07 MED ORDER — MELATONIN 5 MG PO TABS
5.0000 mg | ORAL_TABLET | Freq: Every evening | ORAL | Status: AC | PRN
Start: 2024-01-07 — End: ?
  Administered 2024-01-07 – 2024-01-10 (×2): 5 mg via ORAL
  Filled 2024-01-07 (×2): qty 1

## 2024-01-07 MED ORDER — ALPRAZOLAM 0.25 MG PO TABS
0.2500 mg | ORAL_TABLET | Freq: Once | ORAL | Status: AC | PRN
  Administered 2024-01-07: 0.25 mg via ORAL
  Filled 2024-01-07: qty 1

## 2024-01-07 MED ORDER — POLYETHYLENE GLYCOL 3350 17 GM/SCOOP PO POWD
238.0000 g | Freq: Once | ORAL | Status: DC
  Filled 2024-01-07: qty 238

## 2024-01-07 MED ORDER — EZETIMIBE 10 MG PO TABS
10.0000 mg | ORAL_TABLET | Freq: Every day | ORAL | Status: DC
  Administered 2024-01-07 – 2024-01-11 (×5): 10 mg via ORAL
  Filled 2024-01-07 (×5): qty 1

## 2024-01-07 MED ORDER — LORAZEPAM 0.5 MG PO TABS
0.5000 mg | ORAL_TABLET | Freq: Four times a day (QID) | ORAL | Status: AC | PRN
Start: 2024-01-07 — End: 2024-01-08
  Administered 2024-01-07 – 2024-01-08 (×2): 0.5 mg via ORAL
  Filled 2024-01-07 (×2): qty 1

## 2024-01-07 MED ORDER — SODIUM CHLORIDE 0.9% IV SOLUTION
Freq: Once | INTRAVENOUS | Status: AC
  Filled 2024-01-07: qty 250

## 2024-01-07 MED ORDER — SODIUM CHLORIDE 0.9 % IV SOLN: INTRAVENOUS | Status: DC

## 2024-01-07 MED ORDER — HYDRALAZINE HCL 20 MG/ML IJ SOLN: 5.0000 mg | Freq: Four times a day (QID) | INTRAMUSCULAR | Status: DC | PRN

## 2024-01-07 MED ORDER — SIMVASTATIN 20 MG PO TABS
40.0000 mg | ORAL_TABLET | Freq: Every day | ORAL | Status: DC
  Administered 2024-01-07 – 2024-01-10 (×4): 40 mg via ORAL
  Filled 2024-01-07: qty 2
  Filled 2024-01-07: qty 4
  Filled 2024-01-07 (×2): qty 2

## 2024-01-07 NOTE — ED Notes (Signed)
 Assisted the patient with clean up after a bowel movement from the [facility] and back to bed, no other needs at this time

## 2024-01-07 NOTE — ED Notes (Signed)
 Pt wet from purewick displacement. Pt bedding, chux, and brief changed.

## 2024-01-07 NOTE — Hospital Course (Addendum)
 Ms. Boneta Standre is an 89 year old female with history of hypertension, non-insulin -dependent diabetes mellitus, depression, CKD 3A, cognitive impairment, interstitial lung disease, presents to ED for chief concerns of weakness.  01/06/2024: Patient was admitted for suspected upper GI bleed.  GI service has been made aware and formally consulted for upper endoscopy.  01/07/2024: Hemoglobin improved to 9.7 today.  Clear liquid diet.  N.p.o. after midnight.  In anticipation for endoscopy on 01/08/2024.  01/08/2024: Per nursing, prep was not given last evening.  Endoscopy has been canceled for today and patient has been placed on endoscopy for tomorrow.  Gastroenterologist has been made aware.  Prep has been reordered and discussed with RN that this needs to be given today as ordered.  This has been acknowledged by Engineer, manufacturing.    Clear liquid diet (no red Jell-O, cranberry juice, beef broth, no red/brown/purple liquid.  N.p.o. after midnight except for sips of meds.  Polyethylene glycol powder container mix with Gatorade reordered again for administration on 01/08/2024 at 1800 hrs.  Discussed with nursing at bedside and via secure chat to ensure administration.  Discussed with patient and daughter at bedside to ensure that patient receives this.  01/09/24: EGD today moved to afternoon as patient is not achieved appropriate prep.  Daughter reports the medications given helped her a little bit so she got some rest. Patient had symptoms again early this morning.   Requip 0.5 mg at bedtime ordered. Daughter expresses wish for Rx of this medication on discharge.  PT, OT, TOC consulted for snf.   Single dark bm yeterday, started prep multiple bms with no further bleeding noted.   [phone] Lamar Hasten  Follow up on pathology from white nummular lesions in esophgeal mucosa.   Non bleeding gastric ulcers   Colonosocpy poor prep did see some blood.

## 2024-01-07 NOTE — ED Notes (Signed)
 Pt was unsuccessful on the bedpan, switched and assisted her onto the bedside commode, stand and pivot, gave her the call bell to call when she is ready

## 2024-01-07 NOTE — ED Notes (Addendum)
 Placed the pt on a bedpan, she expressed she needed to have a bowel movement

## 2024-01-07 NOTE — ED Notes (Signed)
 I checked the pt sugar and it was 111, relayed the number to the nurse Zoila), pt also requested a warm blanket, no further needs at this time

## 2024-01-07 NOTE — Progress Notes (Addendum)
 PROGRESS NOTE    MERRI DIMAANO  FMW:[id number] DOB: [birth year] DOA: 01/06/2024 PCP: [name], MD   Ms. Hedy Garro is an 89 year old female with history of hypertension, non-insulin -dependent diabetes mellitus, depression, CKD 3A, cognitive impairment, interstitial lung disease, presents to ED for chief concerns of weakness.  01/06/2024: Patient was admitted for suspected upper GI bleed.  GI service has been made aware and formally consulted for upper endoscopy.  01/07/2024: Hemoglobin improved to 9.7 today.  Clear liquid diet.  N.p.o. after midnight.  In anticipation for endoscopy on 01/08/2024.  Assessment & Plan:   Principal Problem:   Upper GI bleeding, suspect acute on chronic Active Problems:   Acute blood loss anemia (ABLA)   HTN (hypertension)   ILD (interstitial lung disease) (HCC)   Type II diabetes mellitus with renal manifestations (HCC)   Chronic kidney disease, stage 3a (HCC)   Hyperlipidemia   Depression   SIRS with lactic acidosis (systemic inflammatory response syndrome) (HCC)   Aortic atherosclerosis   Electrolyte abnormality   Assessment and Plan:  * Upper GI bleeding, suspect acute on chronic Acute blood loss anemia Lactic acidosis with SIRS  Possible NSAID induced gastropathy Patient presenting with progressive weakness, hemoglobin drop in 3 days from 9.7-6.4 Lactic acidosis/leukocytosis/SIRS criteria  likely related to tissue hypoperfusion from GI bleed Received an IV fluid bolus in the ED Continue transfusion of up to 2 unit PRBCs with serial H&H thereafter Additional IV hydration to maintain adequate perfusion N.p.o. except for ice chips Protonix IV twice daily, antiemetics Hold aspirin and meloxicam GI aware and formally consulted Close hemodynamic monitoring in progressive care unit for signs of bleeding/decompensation  Chronic kidney disease, stage 3a (HCC) Renal function at baseline  Type II diabetes mellitus with renal  manifestations (HCC) Sliding scale insulin  coverage  ILD (interstitial lung disease) (HCC) Not overtly symptomatic Followed by pulmonology Albuterol  as needed  HTN (hypertension) Patient currently not taking amlodipine 5 mg daily, benazepril  20 mg daily, hydrochlorothiazide 25 mg daily As such, these were not resumed during hospitalization Hydralazine  5 mg IV every 6 hours as needed for SBP greater 165, 5 days ordered  SIRS with lactic acidosis (systemic inflammatory response syndrome) (HCC) Tachycardia, tachypnea, leukocytosis and lactic acidosis At this time believe related to GI bleed Low suspicion for infection.  Chest x-ray and UA and respiratory viral panel negative No reports of change in bowel habits Will consider getting CT abdomen based on results of treatment  Depression Continue paroxetine with lorazepam as needed  Hyperlipidemia Simvastatin 40 mg nightly, ezetimibe  10 mg daily were resumed  Electrolyte abnormality Mild hyponatremia of 132, possibly hypovolemic Expecting improvement with IV hydration  Aortic atherosclerosis Holding aspirin due to GI bleed  DVT prophylaxis: DVT prophylaxis not ordered due to suspected upper GI bleed. Code Status: Full code Family Communication: Updated husband, Octaviano at bedside with patient's permission Disposition Plan: Pending clinical course, guarded prognosis Level of care: Progressive  Consultants:  Gastroenterology  Procedures:  Anticipate upper endoscopy on 01/08/2024  Antimicrobials: None indicated  Subjective:  At bedside, patient was able to tell me her first and last name, age, location, current calendar year.  She reports some dark stools.  She denies bright red blood in her stool.  She denies nausea, vomiting, chest pain, shortness of breath, abdominal pain, dysuria, hematuria.  Her husband, Octaviano Harden, of 7 years is at bedside.  Objective: Vitals:   01/07/24 0830 01/07/24 0900 01/07/24 0930 01/07/24 1000   BP: 134/72 139/78 139/69  123/83  Pulse: 83 88 91 89  Resp: 15 17 19  (!) 22  Temp:      TempSrc:      SpO2: 100% 99% 99% 99%  Weight:      Height:       Intake/Output Summary (Last 24 hours) at 01/07/2024 1426 Last data filed at 01/07/2024 0630 Gross per 24 hour  Intake 1432.5 ml  Output --  Net 1432.5 ml   Filed Weights   01/06/24 1757  Weight: 58.6 kg   Examination:  General exam: Appears calm and frail Respiratory system: Clear to auscultation. Respiratory effort normal. Cardiovascular system: S1 & S2 heard, RRR. No JVD, murmurs, rubs, gallops or clicks. No pedal edema. Gastrointestinal system: Abdomen is nondistended, soft and nontender. No organomegaly or masses felt. Normal bowel sounds heard. Central nervous system: Alert and oriented. No focal neurological deficits. Extremities: Symmetric 5 x 5 power. Skin: No rashes, lesions or ulcers Psychiatry: Judgement and insight appear normal. Mood & affect appropriate.   Data Reviewed: I have personally reviewed following labs and imaging studies  CBC: Recent Labs  Lab 01/03/24 1123 01/06/24 1759 01/07/24 0143 01/07/24 0924 01/07/24 1136  WBC 12.4* 13.9* 10.6* 9.6 8.8  NEUTROABS  --  9.4*  --   --   --   HGB 9.7* 6.4* 7.3* 9.7* 8.5*  HCT 30.4* 21.0* 22.3* 29.2* 24.9*  MCV 91.8 95.9 92.5 92.1 89.2  PLT 457* 394 320 322 340   Basic Metabolic Panel: Recent Labs  Lab 01/03/24 1123 01/06/24 1759 01/07/24 1136  NA 135 132* 136  K 4.7 4.5 4.1  CL 99 100 104  CO2 24 19* 20*  GLUCOSE 115* 163* 167*  BUN 35* 76* 39*  CREATININE 1.06* 1.05* 0.87  CALCIUM 9.6 8.3* 8.1*   GFR: Estimated Creatinine Clearance: 41.3 mL/min (by C-G formula based on SCr of 0.87 mg/dL).  Liver Function Tests: Recent Labs  Lab 01/03/24 1123 01/06/24 1759  AST 25 25  ALT 19 15  ALKPHOS 78 65  BILITOT 0.5 0.3  PROT 7.2 6.1*  ALBUMIN 3.4* 2.8*   Coagulation Profile: Recent Labs  Lab 01/06/24 1759  INR 1.0   HbA1C: Recent  Labs    01/07/24 0143  HGBA1C 6.1*   CBG: Recent Labs  Lab 01/06/24 2054 01/06/24 2329 01/07/24 0328 01/07/24 0749 01/07/24 1131  GLUCAP 129* 103* 111* 111* 149*   Thyroid Function Tests: Recent Labs    01/06/24 1759  TSH 2.681   Sepsis Labs: Recent Labs  Lab 01/06/24 1759 01/06/24 1945  LATICACIDVEN 2.6* 1.7   Recent Results (from the past 240 hours)  Urine Culture     Status: Abnormal   Collection Time: 01/03/24 11:23 AM   Specimen: Urine, Clean Catch  Result Value Ref Range Status   Specimen Description   Final    URINE, CLEAN CATCH Performed at [facility], [address] [place], [place] [address]    Special Requests   Final    NONE Performed at [facility], [address] [place], [place] [address]    Culture (A)  Final    <10,000 COLONIES/mL INSIGNIFICANT GROWTH Performed at [facility], [address], [place], [place] [address]    Report Status 01/04/2024 FINAL  Final  Blood Culture (routine x 2)     Status: None (Preliminary result)   Collection Time: 01/06/24  5:59 PM   Specimen: BLOOD  Result Value Ref Range Status   Specimen Description BLOOD BLOOD  LEFT HAND  Final   Special Requests   Final    BOTTLES DRAWN AEROBIC AND ANAEROBIC Blood Culture results may not be optimal due to an inadequate volume of blood received in culture bottles   Culture   Final    NO GROWTH < 12 HOURS Performed at [facility], [address], [place], [place] [address]    Report Status PENDING  Incomplete  Blood Culture (routine x 2)     Status: None (Preliminary result)   Collection Time: 01/06/24  5:59 PM   Specimen: BLOOD  Result Value Ref Range Status   Specimen Description BLOOD RIGHT ANTECUBITAL  Final   Special Requests   Final    BOTTLES DRAWN AEROBIC AND ANAEROBIC Blood Culture adequate volume   Culture   Final    NO GROWTH < 12 HOURS Performed at [facility], [address] [place], [place]  [address]    Report Status PENDING  Incomplete  Resp panel by RT-PCR (RSV, Flu A&B, Covid) Urine, Clean Catch     Status: None   Collection Time: 01/06/24  5:59 PM   Specimen: Urine, Clean Catch; Nasal Swab  Result Value Ref Range Status   SARS Coronavirus 2 by RT PCR NEGATIVE NEGATIVE Final    Comment: (NOTE) SARS-CoV-2 target nucleic acids are NOT DETECTED.  The SARS-CoV-2 RNA is generally detectable in upper respiratory specimens during the acute phase of infection. The lowest concentration of SARS-CoV-2 viral copies this assay can detect is 138 copies/mL. A negative result does not preclude SARS-Cov-2 infection and should not be used as the sole basis for treatment or other patient management decisions. A negative result may occur with  improper specimen collection/handling, submission of specimen other than nasopharyngeal swab, presence of viral mutation(s) within the areas targeted by this assay, and inadequate number of viral copies(<138 copies/mL). A negative result must be combined with clinical observations, patient history, and epidemiological information. The expected result is Negative.  Fact Sheet for Patients:  BloggerCourse.com  Fact Sheet for Healthcare Providers:  SeriousBroker.it  This test is no t yet approved or cleared by the [place]  FDA and  has been authorized for detection and/or diagnosis of SARS-CoV-2 by FDA under an Emergency Use Authorization (EUA). This EUA will remain  in effect (meaning this test can be used) for the duration of the COVID-19 declaration under Section 564(b)(1) of the Act, 21 U.S.C.section 360bbb-3(b)(1), unless the authorization is terminated  or revoked sooner.       Influenza A by PCR NEGATIVE NEGATIVE Final   Influenza B by PCR NEGATIVE NEGATIVE Final    Comment: (NOTE) The Xpert Xpress SARS-CoV-2/FLU/RSV plus assay is intended as an aid in the diagnosis of influenza from  Nasopharyngeal swab specimens and should not be used as a sole basis for treatment. Nasal washings and aspirates are unacceptable for Xpert Xpress SARS-CoV-2/FLU/RSV testing.  Fact Sheet for Patients: BloggerCourse.com  Fact Sheet for Healthcare Providers: SeriousBroker.it  This test is not yet approved or cleared by the [place]  FDA and has been authorized for detection and/or diagnosis of SARS-CoV-2 by FDA under an Emergency Use Authorization (EUA). This EUA will remain in effect (meaning this test can be used) for the duration of the COVID-19 declaration under Section 564(b)(1) of the Act, 21 U.S.C. section 360bbb-3(b)(1), unless the authorization is terminated or revoked.     Resp Syncytial Virus by PCR NEGATIVE NEGATIVE Final    Comment: (NOTE) Fact Sheet for Patients: BloggerCourse.com  Fact  Sheet for Healthcare Providers: SeriousBroker.it  This test is not yet approved or cleared by the [place]  FDA and has been authorized for detection and/or diagnosis of SARS-CoV-2 by FDA under an Emergency Use Authorization (EUA). This EUA will remain in effect (meaning this test can be used) for the duration of the COVID-19 declaration under Section 564(b)(1) of the Act, 21 U.S.C. section 360bbb-3(b)(1), unless the authorization is terminated or revoked.  Performed at [facility], [address], [place], [place] [address]     Radiology Studies: DG Chest [place] 1 View Result Date: 01/06/2024 CLINICAL DATA:  Questionable sepsis - evaluate for abnormality EXAM: PORTABLE CHEST 1 VIEW COMPARISON:  01/03/2024 FINDINGS: Heart and mediastinal contours are within normal limits. Aortic atherosclerosis. Peripheral densities in the upper lobes compatible with chronic lung disease/fibrosis. No acute confluent airspace opacities or effusions. No acute bony abnormality.  IMPRESSION: Chronic changes.  No active disease. Electronically Signed   By: Franky Crease M.D.   On: 01/06/2024 18:26   Scheduled Meds:  ezetimibe   10 mg Oral Daily   insulin  aspart  0-9 Units Subcutaneous Q4H   pantoprazole (PROTONIX) IV  40 mg Intravenous Q12H   PARoxetine  40 mg Oral Daily   polyethylene glycol powder  238 g Oral Once   simvastatin  40 mg Oral QHS   Continuous Infusions:  sodium chloride      sodium chloride  20 mL/hr at 01/07/24 0936    LOS: 0 days   Time spent: 35 minutes  Dr. Sherre [facility] If 7PM-7AM, please contact night-coverage 01/07/2024, 2:26 PM

## 2024-01-07 NOTE — Consult Note (Signed)
 [facility] GI Inpatient Consult Note   Ladell Francis Boss, M.D.  Reason for Consult: Critical anemia   Attending Requesting Consult: Greig Free, D.O.  Outpatient Primary Physician: Ophelia Sage, M.D.  History of Present Illness: [name] is a 89 y.o. female admitted to the hospital late yesterday evening with progressive weakness over the past week.  The patient has noticed significant issues and came to the emergency room on 01/03/2024 for fatigue and weakness.  She was noted to be mildly anemic with a hemoglobin around 9.7 down from her baseline of around 10.6.  She was discharged back home and advised to closely follow-up with her family doctor in the outpatient setting, however, reported again on 1011 with persistent symptoms and a significant drop in hemoglobin to 6.4.  She is unable to say whether or not she has had any melena or hematochezia.  Initially, she denied any gross bleeding.  Rectal examination  performed in the emergency room reportedly showed dark blood that was clearly evident.  Patient denies any nausea or vomiting or abdominal pain to localize bleeding to the upper GI tract.  She has a history of remote colonoscopy circa 2012 that she says was clear.  She was told she did not need to have anymore colonoscopies after that 1 given her age being in the mid 12s at that time.  She takes meloxicam and baby aspirin but denies any prescription blood thinners.      Past Medical History:  Past Medical History:  Diagnosis Date   Adenomatous polyps    on colonoscopy   Arthritis    Essential hypertension, benign    Insomnia    Menopausal syndrome    Other and unspecified hyperlipidemia    Type II or unspecified type diabetes mellitus without mention of complication, not stated as uncontrolled    Wears hearing aid in both ears     Problem List: Patient Active Problem List   Diagnosis Date Noted   Acute blood loss anemia (ABLA) 01/06/2024   Upper GI  bleeding, suspect acute on chronic 01/06/2024   Electrolyte abnormality 01/06/2024   SIRS with lactic acidosis (systemic inflammatory response syndrome) (HCC) 01/06/2024   UTI (urinary tract infection) 10/18/2023   Type II diabetes mellitus with renal manifestations (HCC) 10/18/2023   Depression 10/18/2023   Chronic kidney disease, stage 3a (HCC) 10/18/2023   Hyperkalemia 10/18/2023   Aortic atherosclerosis 05/23/2019   ILD (interstitial lung disease) (HCC) 05/23/2019   Benign essential hypertension 04/16/2019   Pure hypercholesterolemia 04/16/2019   Recurrent major depressive disorder, in full remission 12/06/2017   Foot pain 02/09/2016   Senile purpura 04/14/2015   Osteopenia 09/30/2013   HTN (hypertension) 11/08/2010   Hyperlipidemia 11/08/2010   Chest discomfort 11/08/2010   Controlled type 2 diabetes mellitus without complication, without long-term current use of insulin  (HCC) 11/08/2010    Past Surgical History: Past Surgical History:  Procedure Laterality Date   CATARACT EXTRACTION W/PHACO Right 08/03/2022   Procedure: CATARACT EXTRACTION PHACO AND INTRAOCULAR LENS PLACEMENT (IOC) RIGHT DIABETIC 22.93 1.42.1;  Surgeon: Mittie Gaskin, MD;  Location: [facility];  Service: Ophthalmology;  Laterality: Right;  Diabetic   MENISCUS REPAIR      Allergies: Allergies  Allergen Reactions   Tetanus Antitoxin Rash and Shortness Of Breath   Tetanus Toxoid-Containing Vaccines Shortness Of Breath and Rash    Home Medications: (Not in a hospital admission)  Home medication reconciliation was completed with the patient.   Scheduled Inpatient Medications:  insulin  aspart  0-9 Units Subcutaneous Q4H   pantoprazole (PROTONIX) IV  40 mg Intravenous Q12H   PARoxetine  40 mg Oral Daily    Continuous Inpatient Infusions:    sodium chloride       PRN Inpatient Medications:  acetaminophen  **OR** acetaminophen , albuterol , HYDROcodone-acetaminophen , morphine injection,  ondansetron  **OR** ondansetron  (ZOFRAN ) IV  Family History: family history includes Heart disease in her mother; Hyperlipidemia in her mother; Hypertension in her mother.   GI Family History: Negative  Social History:   reports that she has never smoked. She has never used smokeless tobacco. She reports current alcohol use of about 7.0 standard drinks of alcohol per week. She reports that she does not use drugs. The patient denies ETOH, tobacco, or drug use.    Review of Systems: Review of Systems - History obtained from the patient General ROS: negative Psychological ROS: negative ENT ROS: negative for - epistaxis, headaches, sore throat, or tinnitus Allergy and Immunology ROS: negative Hematological and Lymphatic ROS: negative for - bleeding problems, blood clots, jaundice, swollen lymph nodes, or weight loss Endocrine ROS: negative Cardiovascular ROS: no chest pain or dyspnea on exertion Genito-Urinary ROS: no dysuria, trouble voiding, or hematuria Musculoskeletal ROS: positive for - joint stiffness, joint swelling, and muscular weakness Neurological ROS: no TIA or stroke symptoms Dermatological ROS: positive for rash and skin lesion changes  Physical Examination: BP 133/72   Pulse 82   Temp 98 F (36.7 C) (Oral)   Resp 19   Ht 5' 6 (1.676 m)   Wt 58.6 kg   SpO2 99%   BMI 20.85 kg/m  Physical Exam Constitutional:      General: She is not in acute distress.    Appearance: Normal appearance. She is ill-appearing. She is not toxic-appearing or diaphoretic.  HENT:     Head: Normocephalic and atraumatic.     Right Ear: External ear normal.     Left Ear: External ear normal.     Nose: Nose normal.     Mouth/Throat:     Mouth: Mucous membranes are dry.     Pharynx: No oropharyngeal exudate or posterior oropharyngeal erythema.  Eyes:     General: No scleral icterus.    Extraocular Movements: Extraocular movements intact.     Pupils: Pupils are equal, round, and reactive  to light.  Cardiovascular:     Rate and Rhythm: Normal rate.     Heart sounds: No murmur heard.    No gallop.  Pulmonary:     Effort: Pulmonary effort is normal.     Breath sounds: Normal breath sounds.  Abdominal:     General: There is no distension.     Palpations: Abdomen is soft. There is no mass.     Tenderness: There is no abdominal tenderness.     Hernia: No hernia is present.  Skin:    Capillary Refill: Capillary refill takes less than 2 seconds.     Coloration: Skin is pale. Skin is not jaundiced.     Findings: Lesion present.  Neurological:     General: No focal deficit present.     Mental Status: She is alert. Mental status is at baseline.  Psychiatric:        Mood and Affect: Mood normal.        Behavior: Behavior normal.        Thought Content: Thought content normal.        Judgment: Judgment normal.     Data: Lab Results  Component Value  Date   WBC 10.6 (H) 01/07/2024   HGB 7.3 (L) 01/07/2024   HCT 22.3 (L) 01/07/2024   MCV 92.5 01/07/2024   PLT 320 01/07/2024   Recent Labs  Lab 01/03/24 1123 01/06/24 1759 01/07/24 0143  HGB 9.7* 6.4* 7.3*   Lab Results  Component Value Date   NA 132 (L) 01/06/2024   K 4.5 01/06/2024   CL 100 01/06/2024   CO2 19 (L) 01/06/2024   BUN 76 (H) 01/06/2024   CREATININE 1.05 (H) 01/06/2024   Lab Results  Component Value Date   ALT 15 01/06/2024   AST 25 01/06/2024   ALKPHOS 65 01/06/2024   BILITOT 0.3 01/06/2024   Recent Labs  Lab 01/06/24 1759  INR 1.0      Latest Ref Rng & Units 01/07/2024    1:43 AM 01/06/2024    5:59 PM 01/03/2024   11:23 AM  CBC  WBC 4.0 - 10.5 K/uL 10.6  13.9  12.4   Hemoglobin 12.0 - 15.0 g/dL 7.3  6.4  9.7   Hematocrit 36.0 - 46.0 % 22.3  21.0  30.4   Platelets 150 - 400 K/uL 320  394  457     STUDIES: DG Chest Port 1 View Result Date: 01/06/2024 CLINICAL DATA:  Questionable sepsis - evaluate for abnormality EXAM: PORTABLE CHEST 1 VIEW COMPARISON:  01/03/2024 FINDINGS:  Heart and mediastinal contours are within normal limits. Aortic atherosclerosis. Peripheral densities in the upper lobes compatible with chronic lung disease/fibrosis. No acute confluent airspace opacities or effusions. No acute bony abnormality. IMPRESSION: Chronic changes.  No active disease. Electronically Signed   By: Franky Crease M.D.   On: 01/06/2024 18:26   @IMAGES @  Assessment: Principal Problem:   Upper GI bleeding, suspect acute on chronic Active Problems:   HTN (hypertension)   Aortic atherosclerosis   ILD (interstitial lung disease) (HCC)   Type II diabetes mellitus with renal manifestations (HCC)   Depression   Chronic kidney disease, stage 3a (HCC)   Acute blood loss anemia (ABLA)   Electrolyte abnormality   SIRS with lactic acidosis (systemic inflammatory response syndrome) (HCC)  89 year old female presenting with symptomatic anemia with evidence of gross blood in the rectal vault.  Differential diagnosis includes upper versus lower GI source of bleeding (peptic ulcer disease, gastrointestinal malignancy, angiodysplasia, colorectal malignancy, diverticular bleeding, etc.)   Recommendations: 1.  Serial H&H. 2.  Generous IV fluids as tolerated. 3.  Transfuse as clinically indicated. 4.  EGD and colonoscopy tomorrow.The patient understands the nature of the planned procedure, indications, risks, alternatives and potential complications including but not limited to bleeding, infection, perforation, damage to internal organs and possible oversedation/side effects from anesthesia. The patient agrees and gives consent to proceed.  Please refer to procedure notes for findings, recommendations and patient disposition/instructions. Dr. Jinny will be the performing physician tomorrow. Call me anytime if any questions.  Thank you for the consult. Please call with questions or concerns.  Aundria Ladell Eck MD [facility] [address] [place], [place] [address] [phone]  01/07/2024 8:56 AM

## 2024-01-07 NOTE — ED Notes (Signed)
 Pt was wet , changed her brief and adjusted her purewick, also complaining of anxiety, RN is working on a response from the provider for medication.

## 2024-01-07 NOTE — Assessment & Plan Note (Signed)
 Simvastatin 40 mg nightly, ezetimibe  10 mg daily were resumed

## 2024-01-08 DIAGNOSIS — K922 Gastrointestinal hemorrhage, unspecified: Secondary | ICD-10-CM | POA: Diagnosis not present

## 2024-01-08 DIAGNOSIS — D509 Iron deficiency anemia, unspecified: Secondary | ICD-10-CM | POA: Insufficient documentation

## 2024-01-08 LAB — BPAM RBC
Blood Product Expiration Date: 202511162359
Blood Product Expiration Date: 202511162359
ISSUE DATE / TIME: 202510120255
ISSUE DATE / TIME: 202510112032
Unit Type and Rh: 5100
Unit Type and Rh: 5100

## 2024-01-08 LAB — CBG MONITORING, ED
Glucose-Capillary: 112 mg/dL — ABNORMAL HIGH (ref 70–99)
Glucose-Capillary: 114 mg/dL — ABNORMAL HIGH (ref 70–99)
Glucose-Capillary: 135 mg/dL — ABNORMAL HIGH (ref 70–99)
Glucose-Capillary: 142 mg/dL — ABNORMAL HIGH (ref 70–99)

## 2024-01-08 LAB — TYPE AND SCREEN
ABO/RH(D): O POS
Antibody Screen: NEGATIVE
Unit division: 0
Unit division: 0

## 2024-01-08 LAB — BASIC METABOLIC PANEL WITH GFR
Anion gap: 8 (ref 5–15)
BUN: 24 mg/dL — ABNORMAL HIGH (ref 8–23)
CO2: 26 mmol/L (ref 22–32)
Calcium: 8.7 mg/dL — ABNORMAL LOW (ref 8.9–10.3)
Chloride: 104 mmol/L (ref 98–111)
Creatinine, Ser: 0.71 mg/dL (ref 0.44–1.00)
GFR, Estimated: >60 mL/min (ref >60)
Glucose, Bld: 137 mg/dL — ABNORMAL HIGH (ref 70–99)
Potassium: 4.3 mmol/L (ref 3.5–5.1)
Sodium: 138 mmol/L (ref 135–145)

## 2024-01-08 LAB — CBC
HCT: 31.9 % — ABNORMAL LOW (ref 36.0–46.0)
Hemoglobin: 10.6 g/dL — ABNORMAL LOW (ref 12.0–15.0)
MCH: 29.9 pg (ref 26.0–34.0)
MCHC: 33.2 g/dL (ref 30.0–36.0)
MCV: 90.1 fL (ref 80.0–100.0)
Platelets: 354 K/uL (ref 150–400)
RBC: 3.54 MIL/uL — ABNORMAL LOW (ref 3.87–5.11)
RDW: 13.3 % (ref 11.5–15.5)
WBC: 9.2 K/uL (ref 4.0–10.5)
nRBC: 0 % (ref 0.0–0.2)

## 2024-01-08 LAB — GLUCOSE, CAPILLARY
Glucose-Capillary: 111 mg/dL — ABNORMAL HIGH (ref 70–99)
Glucose-Capillary: 164 mg/dL — ABNORMAL HIGH (ref 70–99)
Glucose-Capillary: 101 mg/dL — ABNORMAL HIGH (ref 70–99)

## 2024-01-08 MED ORDER — LORAZEPAM 0.5 MG PO TABS: 0.5000 mg | ORAL_TABLET | Freq: Four times a day (QID) | ORAL | Status: AC | PRN

## 2024-01-08 MED ORDER — ROPINIROLE HCL 0.25 MG PO TABS
0.2500 mg | ORAL_TABLET | Freq: Two times a day (BID) | ORAL | Status: AC
  Administered 2024-01-08 (×2): 0.25 mg via ORAL
  Filled 2024-01-08 (×2): qty 1

## 2024-01-08 MED ORDER — POLYETHYLENE GLYCOL 3350 17 GM/SCOOP PO POWD
238.0000 g | Freq: Once | ORAL | Status: AC
  Administered 2024-01-08: 238 g via ORAL
  Filled 2024-01-08: qty 238

## 2024-01-08 MED ORDER — MORPHINE SULFATE (PF) 2 MG/ML IV SOLN: 2.0000 mg | INTRAVENOUS | Status: AC | PRN

## 2024-01-08 MED ORDER — MORPHINE SULFATE (PF) 2 MG/ML IV SOLN: 2.0000 mg | INTRAVENOUS | Status: DC | PRN

## 2024-01-08 NOTE — OR Nursing (Signed)
 MIRALAX WAS DISCONTINUED. RN FROM ED LINDSAY UNSURE WHY PREP WAS NOT GIVEN. RN SPOKE WITH DR [facility] WHO STATED MOVE PATIENT TO TOMORROW SO THAT PT CAN BE PREP.RN IN ED INFORMED

## 2024-01-08 NOTE — Assessment & Plan Note (Addendum)
 Continue outpatient iron supplementation I will order iron supplementation after endoscopy Ropinirole 0.25 mg p.o. twice daily initiated, 2 doses ordered for today Will assess patient tomorrow morning for effectiveness and can increase dosing strength as needed  01/09/24: daughter reports the medications given helped her a little bit so she got some rest. Patient had symptoms again early this morning.   Requip 0.5 mg at bedtime ordered. Daughter expresses wish for Rx of this medication on discharge.

## 2024-01-08 NOTE — Progress Notes (Signed)
 Rogelia Copping, MD [facility]   [address], [address] [place], [place] [address] Phone: [phone] Fax : [phone]   Subjective: The patient was supposed to undergo an EGD and colonoscopy today but the patient did not have a prep yesterday and for some reason the prep was canceled although who canceled it is unknown to me at this time.  Patient is sleeping at the time of my interview and I spoke to her husband and her daughter.   Objective: Vital signs in last 24 hours: Vitals:   01/08/24 1310 01/08/24 1400 01/08/24 1500 01/08/24 1604  BP:  128/80 (!) 147/80 (!) 150/80  Pulse:  (!) 122 79 84  Resp:  (!) 34 17 20  Temp: 97.6 F (36.4 C)     TempSrc: Oral     SpO2:  100% 97% 99%  Weight:      Height:       Weight change:   Intake/Output Summary (Last 24 hours) at 01/08/2024 1937 Last data filed at 01/08/2024 1928 Gross per 24 hour  Intake 462.33 ml  Output 800 ml  Net -337.67 ml     Exam: General: Laying in bed asleep and appears comfortable  Lab Results: @LABTEST2 @ Micro Results: Recent Results (from the past 240 hours)  Urine Culture     Status: Abnormal   Collection Time: 01/03/24 11:23 AM   Specimen: Urine, Clean Catch  Result Value Ref Range Status   Specimen Description   Final    URINE, CLEAN CATCH Performed at [facility], [address] [place], [place] [address]    Special Requests   Final    NONE Performed at [facility], [address], [place], [place] [address]    Culture (A)  Final    <10,000 COLONIES/mL INSIGNIFICANT GROWTH Performed at [facility], [address], [place], [place] [address]    Report Status 01/04/2024 FINAL  Final  Blood Culture (routine x 2)     Status: None (Preliminary result)   Collection Time: 01/06/24  5:59 PM   Specimen: BLOOD  Result Value Ref Range Status   Specimen Description BLOOD BLOOD LEFT HAND  Final   Special Requests   Final    BOTTLES DRAWN AEROBIC AND ANAEROBIC Blood  Culture results may not be optimal due to an inadequate volume of blood received in culture bottles   Culture   Final    NO GROWTH 2 DAYS Performed at [facility], [address] [place], [place] [address]    Report Status PENDING  Incomplete  Blood Culture (routine x 2)     Status: None (Preliminary result)   Collection Time: 01/06/24  5:59 PM   Specimen: BLOOD  Result Value Ref Range Status   Specimen Description BLOOD RIGHT ANTECUBITAL  Final   Special Requests   Final    BOTTLES DRAWN AEROBIC AND ANAEROBIC Blood Culture adequate volume   Culture   Final    NO GROWTH 2 DAYS Performed at [facility], [address] [place], [place] [address]    Report Status PENDING  Incomplete  Resp panel by RT-PCR (RSV, Flu A&B, Covid) Urine, Clean Catch     Status: None   Collection Time: 01/06/24  5:59 PM   Specimen: Urine, Clean Catch; Nasal Swab  Result Value Ref Range Status   SARS Coronavirus 2 by RT PCR NEGATIVE NEGATIVE Final    Comment: (NOTE) SARS-CoV-2 target nucleic acids are NOT DETECTED.  The SARS-CoV-2  RNA is generally detectable in upper respiratory specimens during the acute phase of infection. The lowest concentration of SARS-CoV-2 viral copies this assay can detect is 138 copies/mL. A negative result does not preclude SARS-Cov-2 infection and should not be used as the sole basis for treatment or other patient management decisions. A negative result may occur with  improper specimen collection/handling, submission of specimen other than nasopharyngeal swab, presence of viral mutation(s) within the areas targeted by this assay, and inadequate number of viral copies(<138 copies/mL). A negative result must be combined with clinical observations, patient history, and epidemiological information. The expected result is Negative.  Fact Sheet for Patients:  BloggerCourse.com  Fact Sheet for Healthcare Providers:   SeriousBroker.it  This test is no t yet approved or cleared by the [place]  FDA and  has been authorized for detection and/or diagnosis of SARS-CoV-2 by FDA under an Emergency Use Authorization (EUA). This EUA will remain  in effect (meaning this test can be used) for the duration of the COVID-19 declaration under Section 564(b)(1) of the Act, 21 U.S.C.section 360bbb-3(b)(1), unless the authorization is terminated  or revoked sooner.       Influenza A by PCR NEGATIVE NEGATIVE Final   Influenza B by PCR NEGATIVE NEGATIVE Final    Comment: (NOTE) The Xpert Xpress SARS-CoV-2/FLU/RSV plus assay is intended as an aid in the diagnosis of influenza from Nasopharyngeal swab specimens and should not be used as a sole basis for treatment. Nasal washings and aspirates are unacceptable for Xpert Xpress SARS-CoV-2/FLU/RSV testing.  Fact Sheet for Patients: BloggerCourse.com  Fact Sheet for Healthcare Providers: SeriousBroker.it  This test is not yet approved or cleared by the [place]  FDA and has been authorized for detection and/or diagnosis of SARS-CoV-2 by FDA under an Emergency Use Authorization (EUA). This EUA will remain in effect (meaning this test can be used) for the duration of the COVID-19 declaration under Section 564(b)(1) of the Act, 21 U.S.C. section 360bbb-3(b)(1), unless the authorization is terminated or revoked.     Resp Syncytial Virus by PCR NEGATIVE NEGATIVE Final    Comment: (NOTE) Fact Sheet for Patients: BloggerCourse.com  Fact Sheet for Healthcare Providers: SeriousBroker.it  This test is not yet approved or cleared by the [place]  FDA and has been authorized for detection and/or diagnosis of SARS-CoV-2 by FDA under an Emergency Use Authorization (EUA). This EUA will remain in effect (meaning this test can be used) for  the duration of the COVID-19 declaration under Section 564(b)(1) of the Act, 21 U.S.C. section 360bbb-3(b)(1), unless the authorization is terminated or revoked.  Performed at [facility], [address] [place], [place] [address]    Studies/Results: No results found. Medications: I have reviewed the patient's current medications. Scheduled Meds:  ezetimibe   10 mg Oral Daily   insulin  aspart  0-9 Units Subcutaneous Q4H   pantoprazole (PROTONIX) IV  40 mg Intravenous Q12H   PARoxetine  40 mg Oral Daily   rOPINIRole  0.25 mg Oral BID   simvastatin  40 mg Oral QHS   Continuous Infusions:  sodium chloride      sodium chloride  20 mL/hr at 01/08/24 1841   PRN Meds:.acetaminophen  **OR** acetaminophen , albuterol , hydrALAZINE , HYDROcodone-acetaminophen , LORazepam, melatonin, metoprolol tartrate, morphine injection, ondansetron  **OR** ondansetron  (ZOFRAN ) IV   Assessment: Principal Problem:   Upper GI bleeding, suspect acute on chronic Active Problems:   HTN (hypertension)   Hyperlipidemia   Aortic atherosclerosis   ILD (interstitial lung disease) (HCC)   Type II diabetes mellitus  with renal manifestations (HCC)   Depression   Chronic kidney disease, stage 3a (HCC)   Acute blood loss anemia (ABLA)   Electrolyte abnormality   SIRS with lactic acidosis (systemic inflammatory response syndrome) (HCC)   Restless leg syndrome due to iron deficiency anemia    Plan: Patient will be prepped for an EGD and colonoscopy for tomorrow.  The patient has been given a clear liquid diet and n.p.o. after midnight for the EGD and colonoscopy for tomorrow.  The family has been made aware of the plan and agree with it.   LOS: 1 day   Rogelia Copping, MD.FACG 01/08/2024, 7:37 PM Pager [phone] 7am-5pm  Check AMION for 5pm -7am coverage and on weekends

## 2024-01-08 NOTE — Progress Notes (Addendum)
 PROGRESS NOTE    [name]  FMW:[id number] DOB: 21-Jan-1933 DOA: 01/06/2024 PCP: [name], MD   [name] is an 89 year old female with history of hypertension, non-insulin -dependent diabetes mellitus, depression, CKD 3A, cognitive impairment, interstitial lung disease, presents to ED for chief concerns of weakness.  01/06/2024: Patient was admitted for suspected upper GI bleed.  GI service has been made aware and formally consulted for upper endoscopy.  01/07/2024: Hemoglobin improved to 9.7 today.  Clear liquid diet.  N.p.o. after midnight.  In anticipation for endoscopy on 01/08/2024.  01/08/2024: Per nursing, prep was not given last evening.  Endoscopy has been canceled for today and patient has been placed on endoscopy for tomorrow.  Gastroenterologist has been made aware.  Prep has been reordered and discussed with RN that this needs to be given today as ordered.  This has been acknowledged by Engineer, manufacturing.    Clear liquid diet (no red Jell-O, cranberry juice, beef broth, no red/brown/purple liquid.  N.p.o. after midnight except for sips of meds.  Polyethylene glycol powder container mix with Gatorade reordered again for administration on 01/08/2024 at 1800 hrs.  Discussed with nursing at bedside and via secure chat to ensure administration.  Discussed with patient and daughter at bedside to ensure that patient receives this.  Assessment & Plan:   Principal Problem:   Upper GI bleeding, suspect acute on chronic Active Problems:   Acute blood loss anemia (ABLA)   HTN (hypertension)   ILD (interstitial lung disease) (HCC)   Type II diabetes mellitus with renal manifestations (HCC)   Chronic kidney disease, stage 3a (HCC)   Hyperlipidemia   Depression   SIRS with lactic acidosis (systemic inflammatory response syndrome) (HCC)   Aortic atherosclerosis   Electrolyte abnormality   Restless leg syndrome due to iron deficiency anemia   Assessment and Plan:  * Upper  GI bleeding, suspect acute on chronic Acute blood loss anemia Lactic acidosis with SIRS  Possible NSAID induced gastropathy Patient presenting with progressive weakness, hemoglobin drop in 3 days from 9.7-6.4 Lactic acidosis/leukocytosis/SIRS criteria  likely related to tissue hypoperfusion from GI bleed Received an IV fluid bolus in the ED Continue transfusion of up to 2 unit PRBCs with serial  Additional IV hydration to maintain adequate perfusion Continue Protonix IV twice daily, antiemetics Continue hold aspirin and meloxicam GI aware and formally consulted, Dr. Jinny aware Close hemodynamic monitoring in progressive care unit for signs of bleeding/decompensation  01/08/2024: Per nursing, prep was not given last evening.  Endoscopy has been canceled for today and patient has been placed on endoscopy for tomorrow.  Gastroenterologist has been made aware.  Prep has been reordered and discussed with RN that this needs to be given today as ordered.  This has been acknowledged by Engineer, manufacturing.    Clear liquid diet (no red Jell-O, cranberry juice, beef broth, no red/brown/purple liquid.  N.p.o. after midnight except for sips of meds.  Polyethylene glycol powder container mix with Gatorade reordered again for administration on 01/08/2024 at 1800 hrs.  Discussed with nursing at bedside and via secure chat to ensure administration.  Discussed with patient and daughter at bedside to ensure that patient receives this.  Chronic kidney disease, stage 3a (HCC) Renal function at baseline  Type II diabetes mellitus with renal manifestations (HCC) Sliding scale insulin  coverage  ILD (interstitial lung disease) (HCC) Not overtly symptomatic Followed by pulmonology Albuterol  as needed  HTN (hypertension) Patient currently not taking amlodipine 5 mg daily, benazepril   20 mg daily, hydrochlorothiazide 25 mg daily As such, these were not resumed during hospitalization Hydralazine  5 mg IV every 6 hours as  needed for SBP greater 165, 5 days ordered  SIRS with lactic acidosis (systemic inflammatory response syndrome) (HCC) Tachycardia, tachypnea, leukocytosis and lactic acidosis At this time believe related to GI bleed Low suspicion for infection.  Chest x-ray and UA and respiratory viral panel negative No reports of change in bowel habits Will consider getting CT abdomen based on results of treatment  Depression Continue paroxetine with lorazepam as needed  Hyperlipidemia Simvastatin 40 mg nightly, ezetimibe  10 mg daily were resumed  Restless leg syndrome due to iron deficiency anemia Continue outpatient iron supplementation I will order iron supplementation after endoscopy Ropinirole 0.25 mg p.o. twice daily initiated, 2 doses ordered for today Will assess patient tomorrow morning for effectiveness and can increase dosing strength as needed  Electrolyte abnormality Mild hyponatremia of 132, possibly hypovolemic Expecting improvement with IV hydration  Aortic atherosclerosis Holding aspirin due to GI bleed  DVT prophylaxis: DVT prophylaxis contraindicated at this time due to concerns of upper GI bleed Code Status: Full code Family Communication: Disposition Plan: Pending clinical course, upper endoscopy, GI recommendations Level of care: Progressive  Consultants:  Gastroenterology  Procedures:  Anticipate EGD, 01/09/2024  Antimicrobials: None indicated  Subjective:  At bedside, patient was able to tell me her first and last name, age, location, current calendar year.  Daughter Elouise Sharps is at bedside.  Patient denies chest pain, shortness of breath, dysuria, hematuria, diarrhea, nausea, vomiting.  She endorses that her left lower extremity leg feels uncomfortable and that she has to move it in order to feel comfortable.  She reports that she cannot keep it still.  She reports that this has been ongoing for about several weeks and that she has taken Tylenol  as needed  outpatient without much relief.  Objective: Vitals:   01/08/24 1309 01/08/24 1310 01/08/24 1400 01/08/24 1500  BP:   128/80 (!) 147/80  Pulse:   (!) 122 79  Resp:   (!) 34 17  Temp: 98.2 F (36.8 C) 97.6 F (36.4 C)    TempSrc: Oral Oral    SpO2:   100% 97%  Weight:      Height:       Intake/Output Summary (Last 24 hours) at 01/08/2024 1534 Last data filed at 01/08/2024 0714 Gross per 24 hour  Intake 432.66 ml  Output 800 ml  Net -367.34 ml   Filed Weights   01/06/24 1757  Weight: 58.6 kg   Examination:  General exam: Appears calm and comfortable  Respiratory system: Clear to auscultation. Respiratory effort normal. Cardiovascular system: S1 & S2 heard, RRR. No JVD, murmurs, rubs, gallops or clicks. No pedal edema. Gastrointestinal system: Abdomen is nondistended, soft and nontender. No organomegaly or masses felt. Normal bowel sounds heard. Central nervous system: Alert and oriented. No focal neurological deficits. Extremities: Symmetric 5 x 5 power. Skin: No rashes, lesions or ulcers Psychiatry: Judgement and insight appear normal. Mood & affect appropriate.   Data Reviewed: I have personally reviewed following labs and imaging studies  CBC: Recent Labs  Lab 01/06/24 1759 01/07/24 0143 01/07/24 0924 01/07/24 1136 01/08/24 0510  WBC 13.9* 10.6* 9.6 8.8 9.2  NEUTROABS 9.4*  --   --   --   --   HGB 6.4* 7.3* 9.7* 8.5* 10.6*  HCT 21.0* 22.3* 29.2* 24.9* 31.9*  MCV 95.9 92.5 92.1 89.2 90.1  PLT 394 320 322 340  354   Basic Metabolic Panel: Recent Labs  Lab 01/03/24 1123 01/06/24 1759 01/07/24 1136 01/08/24 0510  NA 135 132* 136 138  K 4.7 4.5 4.1 4.3  CL 99 100 104 104  CO2 24 19* 20* 26  GLUCOSE 115* 163* 167* 137*  BUN 35* 76* 39* 24*  CREATININE 1.06* 1.05* 0.87 0.71  CALCIUM 9.6 8.3* 8.1* 8.7*   GFR: Estimated Creatinine Clearance: 45 mL/min (by C-G formula based on SCr of 0.71 mg/dL).  Liver Function Tests: Recent Labs  Lab 01/03/24 1123  01/06/24 1759  AST 25 25  ALT 19 15  ALKPHOS 78 65  BILITOT 0.5 0.3  PROT 7.2 6.1*  ALBUMIN 3.4* 2.8*   Coagulation Profile: Recent Labs  Lab 01/06/24 1759  INR 1.0   HbA1C: Recent Labs    01/07/24 0143  HGBA1C 6.1*   CBG: Recent Labs  Lab 01/07/24 1939 01/08/24 0004 01/08/24 0404 01/08/24 0717 01/08/24 1127  GLUCAP 99 114* 112* 135* 142*   Thyroid Function Tests: Recent Labs    01/06/24 1759  TSH 2.681   Sepsis Labs: Recent Labs  Lab 01/06/24 1759 01/06/24 1945  LATICACIDVEN 2.6* 1.7    Recent Results (from the past 240 hours)  Urine Culture     Status: Abnormal   Collection Time: 01/03/24 11:23 AM   Specimen: Urine, Clean Catch  Result Value Ref Range Status   Specimen Description   Final    URINE, CLEAN CATCH Performed at [facility], [address] [place], [place] [address]    Special Requests   Final    NONE Performed at [facility], [address] [place], [place] [address]    Culture (A)  Final    <10,000 COLONIES/mL INSIGNIFICANT GROWTH Performed at [facility], [address] [place], [place] [address]    Report Status 01/04/2024 FINAL  Final  Blood Culture (routine x 2)     Status: None (Preliminary result)   Collection Time: 01/06/24  5:59 PM   Specimen: BLOOD  Result Value Ref Range Status   Specimen Description BLOOD BLOOD LEFT HAND  Final   Special Requests   Final    BOTTLES DRAWN AEROBIC AND ANAEROBIC Blood Culture results may not be optimal due to an inadequate volume of blood received in culture bottles   Culture   Final    NO GROWTH 2 DAYS Performed at [facility], [address] [place], [place] [address]    Report Status PENDING  Incomplete  Blood Culture (routine x 2)     Status: None (Preliminary result)   Collection Time: 01/06/24  5:59 PM   Specimen: BLOOD  Result Value Ref Range Status   Specimen Description BLOOD RIGHT ANTECUBITAL  Final   Special Requests   Final     BOTTLES DRAWN AEROBIC AND ANAEROBIC Blood Culture adequate volume   Culture   Final    NO GROWTH 2 DAYS Performed at [facility], [address], [place], [place] [address]    Report Status PENDING  Incomplete  Resp panel by RT-PCR (RSV, Flu A&B, Covid) Urine, Clean Catch     Status: None   Collection Time: 01/06/24  5:59 PM   Specimen: Urine, Clean Catch; Nasal Swab  Result Value Ref Range Status   SARS Coronavirus 2 by RT PCR NEGATIVE NEGATIVE Final    Comment: (NOTE) SARS-CoV-2 target nucleic acids are NOT DETECTED.  The SARS-CoV-2 RNA is generally detectable in upper respiratory specimens  during the acute phase of infection. The lowest concentration of SARS-CoV-2 viral copies this assay can detect is 138 copies/mL. A negative result does not preclude SARS-Cov-2 infection and should not be used as the sole basis for treatment or other patient management decisions. A negative result may occur with  improper specimen collection/handling, submission of specimen other than nasopharyngeal swab, presence of viral mutation(s) within the areas targeted by this assay, and inadequate number of viral copies(<138 copies/mL). A negative result must be combined with clinical observations, patient history, and epidemiological information. The expected result is Negative.  Fact Sheet for Patients:  BloggerCourse.com  Fact Sheet for Healthcare Providers:  SeriousBroker.it  This test is no t yet approved or cleared by the [place]  FDA and  has been authorized for detection and/or diagnosis of SARS-CoV-2 by FDA under an Emergency Use Authorization (EUA). This EUA will remain  in effect (meaning this test can be used) for the duration of the COVID-19 declaration under Section 564(b)(1) of the Act, 21 U.S.C.section 360bbb-3(b)(1), unless the authorization is terminated  or revoked sooner.       Influenza A by PCR NEGATIVE  NEGATIVE Final   Influenza B by PCR NEGATIVE NEGATIVE Final    Comment: (NOTE) The Xpert Xpress SARS-CoV-2/FLU/RSV plus assay is intended as an aid in the diagnosis of influenza from Nasopharyngeal swab specimens and should not be used as a sole basis for treatment. Nasal washings and aspirates are unacceptable for Xpert Xpress SARS-CoV-2/FLU/RSV testing.  Fact Sheet for Patients: BloggerCourse.com  Fact Sheet for Healthcare Providers: SeriousBroker.it  This test is not yet approved or cleared by the [place]  FDA and has been authorized for detection and/or diagnosis of SARS-CoV-2 by FDA under an Emergency Use Authorization (EUA). This EUA will remain in effect (meaning this test can be used) for the duration of the COVID-19 declaration under Section 564(b)(1) of the Act, 21 U.S.C. section 360bbb-3(b)(1), unless the authorization is terminated or revoked.     Resp Syncytial Virus by PCR NEGATIVE NEGATIVE Final    Comment: (NOTE) Fact Sheet for Patients: BloggerCourse.com  Fact Sheet for Healthcare Providers: SeriousBroker.it  This test is not yet approved or cleared by the [place]  FDA and has been authorized for detection and/or diagnosis of SARS-CoV-2 by FDA under an Emergency Use Authorization (EUA). This EUA will remain in effect (meaning this test can be used) for the duration of the COVID-19 declaration under Section 564(b)(1) of the Act, 21 U.S.C. section 360bbb-3(b)(1), unless the authorization is terminated or revoked.  Performed at [facility], [address], [place], [place] [address]     Radiology Studies: DG Chest [place] 1 View Result Date: 01/06/2024 CLINICAL DATA:  Questionable sepsis - evaluate for abnormality EXAM: PORTABLE CHEST 1 VIEW COMPARISON:  01/03/2024 FINDINGS: Heart and mediastinal contours are within normal limits. Aortic  atherosclerosis. Peripheral densities in the upper lobes compatible with chronic lung disease/fibrosis. No acute confluent airspace opacities or effusions. No acute bony abnormality. IMPRESSION: Chronic changes.  No active disease. Electronically Signed   By: Franky Crease M.D.   On: 01/06/2024 18:26   Scheduled Meds:  ezetimibe   10 mg Oral Daily   insulin  aspart  0-9 Units Subcutaneous Q4H   pantoprazole (PROTONIX) IV  40 mg Intravenous Q12H   PARoxetine  40 mg Oral Daily   polyethylene glycol powder  238 g Oral Once   rOPINIRole  0.25 mg Oral BID   simvastatin  40 mg Oral QHS   Continuous Infusions:  sodium chloride      sodium chloride  20 mL/hr at 01/08/24 0714    LOS: 1 day   Time spent: 35 minutes  Dr. Sherre [facility] If 7PM-7AM, please contact night-coverage 01/08/2024, 3:34 PM

## 2024-01-08 NOTE — ED Notes (Signed)
 Pt unable to go to Endo today due to not receiving prep last night per Endo RN. Pt to be placed on schedule for tomorrow.

## 2024-01-09 ENCOUNTER — Encounter: Payer: Self-pay | Admitting: Gastroenterology

## 2024-01-09 ENCOUNTER — Inpatient Hospital Stay: Payer: Self-pay | Admitting: Anesthesiology

## 2024-01-09 ENCOUNTER — Encounter
Admission: EM | Disposition: A | Discharge status: Skilled Nursing Facility | Payer: Self-pay | Source: Home / Self Care | Attending: Internal Medicine

## 2024-01-09 DIAGNOSIS — K253 Acute gastric ulcer without hemorrhage or perforation: Secondary | ICD-10-CM

## 2024-01-09 DIAGNOSIS — D62 Acute posthemorrhagic anemia: Principal | ICD-10-CM

## 2024-01-09 DIAGNOSIS — K2289 Other specified disease of esophagus: Secondary | ICD-10-CM

## 2024-01-09 DIAGNOSIS — K922 Gastrointestinal hemorrhage, unspecified: Secondary | ICD-10-CM | POA: Diagnosis not present

## 2024-01-09 DIAGNOSIS — K259 Gastric ulcer, unspecified as acute or chronic, without hemorrhage or perforation: Secondary | ICD-10-CM

## 2024-01-09 DIAGNOSIS — K449 Diaphragmatic hernia without obstruction or gangrene: Secondary | ICD-10-CM

## 2024-01-09 DIAGNOSIS — K921 Melena: Secondary | ICD-10-CM

## 2024-01-09 HISTORY — PX: ESOPHAGOGASTRODUODENOSCOPY: SHX5428

## 2024-01-09 HISTORY — PX: COLONOSCOPY: SHX5424

## 2024-01-09 HISTORY — PX: ESOPHAGEAL BRUSHING: SHX6842

## 2024-01-09 LAB — CBC
HCT: 30.1 % — ABNORMAL LOW (ref 36.0–46.0)
Hemoglobin: 10.1 g/dL — ABNORMAL LOW (ref 12.0–15.0)
MCH: 30.9 pg (ref 26.0–34.0)
MCHC: 33.6 g/dL (ref 30.0–36.0)
MCV: 92 fL (ref 80.0–100.0)
Platelets: 362 K/uL (ref 150–400)
RBC: 3.27 MIL/uL — ABNORMAL LOW (ref 3.87–5.11)
RDW: 13.3 % (ref 11.5–15.5)
WBC: 9 K/uL (ref 4.0–10.5)
nRBC: 0 % (ref 0.0–0.2)

## 2024-01-09 LAB — BASIC METABOLIC PANEL WITH GFR
Anion gap: 8 (ref 5–15)
BUN: 18 mg/dL (ref 8–23)
CO2: 25 mmol/L (ref 22–32)
Calcium: 8.9 mg/dL (ref 8.9–10.3)
Chloride: 104 mmol/L (ref 98–111)
Creatinine, Ser: 0.91 mg/dL (ref 0.44–1.00)
GFR, Estimated: >60 mL/min (ref >60)
Glucose, Bld: 132 mg/dL — ABNORMAL HIGH (ref 70–99)
Potassium: 4.6 mmol/L (ref 3.5–5.1)
Sodium: 137 mmol/L (ref 135–145)

## 2024-01-09 LAB — GLUCOSE, CAPILLARY
Glucose-Capillary: 141 mg/dL — ABNORMAL HIGH (ref 70–99)
Glucose-Capillary: 100 mg/dL — ABNORMAL HIGH (ref 70–99)
Glucose-Capillary: 122 mg/dL — ABNORMAL HIGH (ref 70–99)
Glucose-Capillary: 127 mg/dL — ABNORMAL HIGH (ref 70–99)

## 2024-01-09 LAB — KOH PREP

## 2024-01-09 SURGERY — EGD (ESOPHAGOGASTRODUODENOSCOPY)
Anesthesia: General

## 2024-01-09 MED ORDER — ROPINIROLE HCL 1 MG PO TABS
0.5000 mg | ORAL_TABLET | Freq: Every day | ORAL | Status: AC
Start: 2024-01-09 — End: ?
  Administered 2024-01-09 – 2024-01-10 (×2): 0.5 mg via ORAL
  Filled 2024-01-09 (×2): qty 1

## 2024-01-09 MED ORDER — DEXMEDETOMIDINE HCL IN NACL 80 MCG/20ML IV SOLN
INTRAVENOUS | Status: DC | PRN
  Administered 2024-01-09 (×2): 4 ug via INTRAVENOUS

## 2024-01-09 MED ORDER — PROPOFOL 10 MG/ML IV BOLUS: INTRAVENOUS | Status: DC | PRN

## 2024-01-09 MED ORDER — PROPOFOL 10 MG/ML IV BOLUS
INTRAVENOUS | Status: DC | PRN
  Administered 2024-01-09 (×3): 10 mg via INTRAVENOUS
  Administered 2024-01-09: 30 mg via INTRAVENOUS

## 2024-01-09 MED ORDER — ONDANSETRON HCL 4 MG/2ML IJ SOLN
INTRAMUSCULAR | Status: DC | PRN
  Administered 2024-01-09: 4 mg via INTRAVENOUS

## 2024-01-09 MED ORDER — PROPOFOL 500 MG/50ML IV EMUL
INTRAVENOUS | Status: DC | PRN
Start: 2024-01-09 — End: 2024-01-09
  Administered 2024-01-09: 75 ug/kg/min via INTRAVENOUS

## 2024-01-09 NOTE — Anesthesia Procedure Notes (Signed)
 Procedure Name: MAC Date/Time: 01/09/2024 4:35 PM  Performed by: Lacretia Camelia NOVAK, CRNAPre-anesthesia Checklist: Patient identified, Emergency Drugs available, Suction available and Patient being monitored Patient Re-evaluated:Patient Re-evaluated prior to induction Oxygen Delivery Method: Nasal cannula

## 2024-01-09 NOTE — Anesthesia Postprocedure Evaluation (Signed)
 Anesthesia Post Note  Patient: [name]  Procedure(s) Performed: EGD (ESOPHAGOGASTRODUODENOSCOPY) COLONOSCOPY ESOPHAGOSCOPY, WITH BRUSH BIOPSY  Patient location during evaluation: Endoscopy Anesthesia Type: General Level of consciousness: awake and alert Pain management: pain level controlled Vital Signs Assessment: post-procedure vital signs reviewed and stable Respiratory status: spontaneous breathing, nonlabored ventilation and respiratory function stable Cardiovascular status: blood pressure returned to baseline and stable Postop Assessment: no apparent nausea or vomiting Anesthetic complications: no   No notable events documented.   Last Vitals:  Vitals:   01/09/24 1946 01/09/24 2000  BP: 120/67   Pulse: 91   Resp: (!) 21 17  Temp: 36.8 C   SpO2: 99%     Last Pain:  Vitals:   01/09/24 1946  TempSrc: Oral  PainSc: 0-No pain                 Camellia Merilee Louder

## 2024-01-09 NOTE — Anesthesia Preprocedure Evaluation (Addendum)
 Anesthesia Evaluation  Patient identified by MRN, date of birth, ID band Patient awake    Reviewed: Allergy & Precautions, H&P , NPO status , Patient's Chart, lab work & pertinent test results  Airway Mallampati: III  TM Distance: <3 FB Neck ROM: Full    Dental no notable dental hx.  Some caps :   Pulmonary  ILD    Pulmonary exam normal breath sounds clear to auscultation       Cardiovascular hypertension, Normal cardiovascular exam Rhythm:Regular Rate:Normal  Hx senile purpura    Neuro/Psych  PSYCHIATRIC DISORDERS  Depression    cognitive impairmentnegative neurological ROS     GI/Hepatic negative GI ROS, Neg liver ROS,,,  Endo/Other  diabetes, Type 2, Oral Hypoglycemic Agents    Renal/GU Renal InsufficiencyRenal disease  negative genitourinary   Musculoskeletal  (+) Arthritis , Osteoarthritis,    Abdominal   Peds negative pediatric ROS (+)  Hematology  (+) Blood dyscrasia, anemia   Anesthesia Other Findings  * Upper GI bleeding, suspect acute on chronic Acute blood loss anemia Lactic acidosis with SIRS  Possible NSAID induced gastropathy Patient presenting with progressive weakness, hemoglobin drop in 3 days from 9.7-6.4 Lactic acidosis/leukocytosis/SIRS criteria  likely related to tissue hypoperfusion from GI bleed   Reproductive/Obstetrics negative OB ROS                              Anesthesia Physical Anesthesia Plan  ASA: 3  Anesthesia Plan: General   Post-op Pain Management:    Induction: Intravenous  PONV Risk Score and Plan:   Airway Management Planned: Natural Airway and Nasal Cannula  Additional Equipment:   Intra-op Plan:   Post-operative Plan:   Informed Consent: I have reviewed the patients History and Physical, chart, labs and discussed the procedure including the risks, benefits and alternatives for the proposed anesthesia with the patient or  authorized representative who has indicated his/her understanding and acceptance.     Dental Advisory Given and Consent reviewed with POA  Plan Discussed with: Anesthesiologist, CRNA and Surgeon  Anesthesia Plan Comments:          Anesthesia Quick Evaluation

## 2024-01-09 NOTE — Plan of Care (Signed)
  Problem: Pain Managment: Goal: General experience of comfort will improve and/or be controlled Outcome: Progressing

## 2024-01-09 NOTE — Care Management Important Message (Signed)
 Important Message  Patient Details  Name: [name] MRN: [id number] Date of Birth: [birth year]   Important Message Given:  Yes - Medicare IM     [name] 01/09/2024, 3:28 PM

## 2024-01-09 NOTE — Plan of Care (Signed)
  Problem: Education: Goal: Knowledge of General Education information will improve Description: Including pain rating scale, medication(s)/side effects and non-pharmacologic comfort measures Outcome: Progressing   Problem: Clinical Measurements: Goal: Will remain free from infection Outcome: Progressing Goal: Respiratory complications will improve Outcome: Progressing Goal: Cardiovascular complication will be avoided Outcome: Progressing   Problem: Pain Managment: Goal: General experience of comfort will improve and/or be controlled Outcome: Progressing   Problem: Activity: Goal: Risk for activity intolerance will decrease Outcome: Not Progressing

## 2024-01-09 NOTE — TOC CM/SW Note (Addendum)
 Transition of Care [facility]) - Inpatient Brief Assessment   Patient Details  Name: [name] MRN: [id number] Date of Birth: [birth year]  Transition of Care [facility]) CM/SW Contact:    Lauraine JAYSON Carpen, LCSW Phone Number: 01/09/2024, 10:22 AM   Clinical Narrative: CSW reviewed chart. No TOC needs identified so far. CSW will continue to follow progress. Please place [facility] consult if any needs arise.  1:19 pm: Received call from [facility] of [facility]. Patient lives in their ILF but will likely need SNF at discharge. PT and OT were consulted today. Will follow for their recommendations.  Transition of Care Asessment: Insurance and Status: Insurance coverage has been reviewed Patient has primary care physician: Yes Home environment has been reviewed: Single family home Prior level of function:: Not documented Prior/Current Home Services: No current home services Social Drivers of Health Review: SDOH reviewed no interventions necessary Readmission risk has been reviewed: Yes Transition of care needs: no transition of care needs at this time

## 2024-01-09 NOTE — Consult Note (Addendum)
 WOC Nurse Consult Note: Reason for Consult: Consult requested for left leg.  Pt has generalized edema and thin fragile skin.  Left posterior leg with full thickness wound, red and moist with some yellow scattered throughout, mod amt yellow drainage, 3X3X.2cm.     Topical treatment orders provided for bedside nurses to perform as follows to absorb drainage and provide antimicrobial benefits: Cut piece of Aquacel Soila # [phone]) and apply to left posterior leg Q day, then cover with foam dressing. Change foam dressing Q 3 days or PRN soiling.  Moisten previous Aquacel with NS to assist with removal each time.   Please re-consult if further assistance is needed.  Thank-you,  Stephane Fought MSN, RN, CWOCN, CWCN-AP, CNS Contact Mon-Fri 0700-1500: [phone]

## 2024-01-09 NOTE — Progress Notes (Addendum)
 PROGRESS NOTE  [name]  FMW:[id number] DOB: 19-Apr-1932 DOA: 01/06/2024 PCP: [name], MD   [name] is an 89 year old female with history of hypertension, non-insulin -dependent diabetes mellitus, depression, CKD 3A, cognitive impairment, interstitial lung disease, presents to ED for chief concerns of weakness.  01/06/2024: Patient was admitted for suspected upper GI bleed.  GI service has been made aware and formally consulted for upper endoscopy.  01/07/2024: Hemoglobin improved to 9.7 today.  Clear liquid diet.  N.p.o. after midnight.  In anticipation for endoscopy on 01/08/2024.  01/08/2024: Per nursing, prep was not given last evening.  Endoscopy has been canceled for today and patient has been placed on endoscopy for tomorrow.  Gastroenterologist has been made aware.  Prep has been reordered and discussed with RN that this needs to be given today as ordered.  This has been acknowledged by Engineer, manufacturing.    Clear liquid diet (no red Jell-O, cranberry juice, beef broth, no red/brown/purple liquid.  N.p.o. after midnight except for sips of meds.  Polyethylene glycol powder container mix with Gatorade reordered again for administration on 01/08/2024 at 1800 hrs.  Discussed with nursing at bedside and via secure chat to ensure administration.  Discussed with patient and daughter at bedside to ensure that patient receives this.  01/09/24: EGD today moved to afternoon as patient is not achieved appropriate prep.  Daughter reports the medications given helped her a little bit so she got some rest. Patient had symptoms again early this morning.   Requip 0.5 mg at bedtime ordered. Daughter expresses wish for Rx of this medication on discharge.  PT, OT, TOC consulted for snf.  Assessment & Plan:   Principal Problem:   Upper GI bleeding, suspect acute on chronic Active Problems:   Acute blood loss anemia (ABLA)   HTN (hypertension)   ILD (interstitial lung disease) (HCC)    Type II diabetes mellitus with renal manifestations (HCC)   Chronic kidney disease, stage 3a (HCC)   Hyperlipidemia   Depression   SIRS with lactic acidosis (systemic inflammatory response syndrome) (HCC)   Aortic atherosclerosis   Electrolyte abnormality   Restless leg syndrome due to iron deficiency anemia   Assessment and Plan:  * Upper GI bleeding, suspect acute on chronic Acute blood loss anemia Lactic acidosis with SIRS  Possible NSAID induced gastropathy Patient presenting with progressive weakness, hemoglobin drop in 3 days from 9.7-6.4 Lactic acidosis/leukocytosis/SIRS criteria  likely related to tissue hypoperfusion from GI bleed Received an IV fluid bolus in the ED Continue transfusion of up to 2 unit PRBCs with serial  Additional IV hydration to maintain adequate perfusion Continue Protonix IV twice daily, antiemetics Continue hold aspirin and meloxicam GI aware and formally consulted, Dr. Jinny aware Close hemodynamic monitoring in progressive care unit for signs of bleeding/decompensation  01/08/2024: Per nursing, prep was not given last evening.  Endoscopy has been canceled for today and patient has been placed on endoscopy for tomorrow.  Gastroenterologist has been made aware.  Prep has been reordered and discussed with RN that this needs to be given today as ordered.  This has been acknowledged by Engineer, manufacturing.    Clear liquid diet (no red Jell-O, cranberry juice, beef broth, no red/brown/purple liquid.  N.p.o. after midnight except for sips of meds.  Polyethylene glycol powder container mix with Gatorade reordered again for administration on 01/08/2024 at 1800 hrs.  Discussed with nursing at bedside and via secure chat to ensure administration.  Discussed with patient and  daughter at bedside to ensure that patient receives this.  01/09/24: EGD today moved to afternoon as patient is not achieved appropriate prep.   Chronic kidney disease, stage 3a (HCC) Renal function at  baseline  Type II diabetes mellitus with renal manifestations (HCC) Sliding scale insulin  coverage  ILD (interstitial lung disease) (HCC) Not overtly symptomatic Followed by pulmonology Albuterol  as needed  HTN (hypertension) Patient currently not taking amlodipine 5 mg daily, benazepril  20 mg daily, hydrochlorothiazide 25 mg daily As such, these were not resumed during hospitalization Hydralazine  5 mg IV every 6 hours as needed for SBP greater 165, 5 days ordered  SIRS with lactic acidosis (systemic inflammatory response syndrome) (HCC) Tachycardia, tachypnea, leukocytosis and lactic acidosis At this time believe related to GI bleed Low suspicion for infection.  Chest x-ray and UA and respiratory viral panel negative No reports of change in bowel habits Will consider getting CT abdomen based on results of treatment  Depression Continue paroxetine with lorazepam as needed  Hyperlipidemia Simvastatin 40 mg nightly, ezetimibe  10 mg daily were resumed  Restless leg syndrome due to iron deficiency anemia Continue outpatient iron supplementation I will order iron supplementation after endoscopy Ropinirole 0.25 mg p.o. twice daily initiated, 2 doses ordered for today Will assess patient tomorrow morning for effectiveness and can increase dosing strength as needed  01/09/24: daughter reports the medications given helped her a little bit so she got some rest. Patient had symptoms again early this morning.   Requip 0.5 mg at bedtime ordered. Daughter expresses wish for Rx of this medication on discharge.  Electrolyte abnormality Mild hyponatremia of 132, possibly hypovolemic Expecting improvement with IV hydration  Aortic atherosclerosis Holding aspirin due to GI bleed   DVT prophylaxis: Pharmacologic DVT not initiated due to egd procedre.  AM team to initiate pharmacologic DVT when the benefits outweigh the risk. Code Status: full code Family Communication: updated daughter at  bedside Disposition Plan: pending clinical course Level of care: Progressive  Consultants:  GI, PT, OT, TOC  Procedures:  Egd anticipate today  Antimicrobials: None indicated   Subjective:  At bedside, she does not appear to be in acute distress. She states nothing is bothering her.  Objective: Vitals:   01/09/24 0357 01/09/24 0400 01/09/24 0616 01/09/24 0754  BP:    122/70  Pulse:    79  Resp: 19 16 14    Temp:    97.8 F (36.6 C)  TempSrc:    Oral  SpO2:    100%  Weight:      Height:       Intake/Output Summary (Last 24 hours) at 01/09/2024 1228 Last data filed at 01/09/2024 0900 Gross per 24 hour  Intake 460 ml  Output 330 ml  Net 130 ml   Filed Weights   01/06/24 1757  Weight: 58.6 kg   Examination:  General exam: Appears frail, weak, calm and comfortable  Respiratory system: Clear to auscultation. Respiratory effort normal. Cardiovascular system: S1 & S2 heard, RRR. No JVD, murmurs, rubs, gallops or clicks. No pedal edema. Gastrointestinal system: Abdomen is nondistended, soft and nontender. No organomegaly or masses felt. Normal bowel sounds heard. Central nervous system: Alert and oriented. No focal neurological deficits. Extremities: Symmetric 5 x 5 power. Skin: bilateral lower extremities leg wounds, poa     Psychiatry: Judgement and insight appear normal. Mood & affect appropriate.   Data Reviewed: I have personally reviewed following labs and imaging studies  CBC: Recent Labs  Lab 01/06/24 1759 01/07/24 0143 01/07/24  9075 01/07/24 1136 01/08/24 0510 01/09/24 0423  WBC 13.9* 10.6* 9.6 8.8 9.2 9.0  NEUTROABS 9.4*  --   --   --   --   --   HGB 6.4* 7.3* 9.7* 8.5* 10.6* 10.1*  HCT 21.0* 22.3* 29.2* 24.9* 31.9* 30.1*  MCV 95.9 92.5 92.1 89.2 90.1 92.0  PLT 394 320 322 340 354 362   Basic Metabolic Panel: Recent Labs  Lab 01/03/24 1123 01/06/24 1759 01/07/24 1136 01/08/24 0510 01/09/24 0423  NA 135 132* 136 138 137  K 4.7 4.5 4.1  4.3 4.6  CL 99 100 104 104 104  CO2 24 19* 20* 26 25  GLUCOSE 115* 163* 167* 137* 132*  BUN 35* 76* 39* 24* 18  CREATININE 1.06* 1.05* 0.87 0.71 0.91  CALCIUM 9.6 8.3* 8.1* 8.7* 8.9   GFR: Estimated Creatinine Clearance: 39.5 mL/min (by C-G formula based on SCr of 0.91 mg/dL).  Liver Function Tests: Recent Labs  Lab 01/03/24 1123 01/06/24 1759  AST 25 25  ALT 19 15  ALKPHOS 78 65  BILITOT 0.5 0.3  PROT 7.2 6.1*  ALBUMIN 3.4* 2.8*   Coagulation Profile: Recent Labs  Lab 01/06/24 1759  INR 1.0   HbA1C: Recent Labs    01/07/24 0143  HGBA1C 6.1*   CBG: Recent Labs  Lab 01/08/24 2049 01/08/24 2325 01/09/24 0358 01/09/24 0736 01/09/24 1205  GLUCAP 164* 101* 141* 100* 122*   Thyroid Function Tests: Recent Labs    01/06/24 1759  TSH 2.681   Sepsis Labs: Recent Labs  Lab 01/06/24 1759 01/06/24 1945  LATICACIDVEN 2.6* 1.7   Recent Results (from the past 240 hours)  Urine Culture     Status: Abnormal   Collection Time: 01/03/24 11:23 AM   Specimen: Urine, Clean Catch  Result Value Ref Range Status   Specimen Description   Final    URINE, CLEAN CATCH Performed at [facility], [address], [place], [place] [address]    Special Requests   Final    NONE Performed at [facility], [address] [place], [place] [address]    Culture (A)  Final    <10,000 COLONIES/mL INSIGNIFICANT GROWTH Performed at [facility], [address], [place], [place] [address]    Report Status 01/04/2024 FINAL  Final  Blood Culture (routine x 2)     Status: None (Preliminary result)   Collection Time: 01/06/24  5:59 PM   Specimen: BLOOD  Result Value Ref Range Status   Specimen Description BLOOD BLOOD LEFT HAND  Final   Special Requests   Final    BOTTLES DRAWN AEROBIC AND ANAEROBIC Blood Culture results may not be optimal due to an inadequate volume of blood received in culture bottles   Culture   Final    NO GROWTH 3 DAYS Performed at  [facility], [address] [place], [place] [address]    Report Status PENDING  Incomplete  Blood Culture (routine x 2)     Status: None (Preliminary result)   Collection Time: 01/06/24  5:59 PM   Specimen: BLOOD  Result Value Ref Range Status   Specimen Description BLOOD RIGHT ANTECUBITAL  Final   Special Requests   Final    BOTTLES DRAWN AEROBIC AND ANAEROBIC Blood Culture adequate volume   Culture   Final    NO GROWTH 3 DAYS Performed at [facility], [address], [place], [place] [address]    Report Status PENDING  Incomplete  Resp panel by RT-PCR (RSV, Flu A&B, Covid) Urine, Clean Catch     Status: None   Collection Time: 01/06/24  5:59 PM   Specimen: Urine, Clean Catch; Nasal Swab  Result Value Ref Range Status   SARS Coronavirus 2 by RT PCR NEGATIVE NEGATIVE Final    Comment: (NOTE) SARS-CoV-2 target nucleic acids are NOT DETECTED.  The SARS-CoV-2 RNA is generally detectable in upper respiratory specimens during the acute phase of infection. The lowest concentration of SARS-CoV-2 viral copies this assay can detect is 138 copies/mL. A negative result does not preclude SARS-Cov-2 infection and should not be used as the sole basis for treatment or other patient management decisions. A negative result may occur with  improper specimen collection/handling, submission of specimen other than nasopharyngeal swab, presence of viral mutation(s) within the areas targeted by this assay, and inadequate number of viral copies(<138 copies/mL). A negative result must be combined with clinical observations, patient history, and epidemiological information. The expected result is Negative.  Fact Sheet for Patients:  BloggerCourse.com  Fact Sheet for Healthcare Providers:  SeriousBroker.it  This test is no t yet approved or cleared by the [place]  FDA and  has been authorized for detection and/or diagnosis  of SARS-CoV-2 by FDA under an Emergency Use Authorization (EUA). This EUA will remain  in effect (meaning this test can be used) for the duration of the COVID-19 declaration under Section 564(b)(1) of the Act, 21 U.S.C.section 360bbb-3(b)(1), unless the authorization is terminated  or revoked sooner.       Influenza A by PCR NEGATIVE NEGATIVE Final   Influenza B by PCR NEGATIVE NEGATIVE Final    Comment: (NOTE) The Xpert Xpress SARS-CoV-2/FLU/RSV plus assay is intended as an aid in the diagnosis of influenza from Nasopharyngeal swab specimens and should not be used as a sole basis for treatment. Nasal washings and aspirates are unacceptable for Xpert Xpress SARS-CoV-2/FLU/RSV testing.  Fact Sheet for Patients: BloggerCourse.com  Fact Sheet for Healthcare Providers: SeriousBroker.it  This test is not yet approved or cleared by the [place]  FDA and has been authorized for detection and/or diagnosis of SARS-CoV-2 by FDA under an Emergency Use Authorization (EUA). This EUA will remain in effect (meaning this test can be used) for the duration of the COVID-19 declaration under Section 564(b)(1) of the Act, 21 U.S.C. section 360bbb-3(b)(1), unless the authorization is terminated or revoked.     Resp Syncytial Virus by PCR NEGATIVE NEGATIVE Final    Comment: (NOTE) Fact Sheet for Patients: BloggerCourse.com  Fact Sheet for Healthcare Providers: SeriousBroker.it  This test is not yet approved or cleared by the [place]  FDA and has been authorized for detection and/or diagnosis of SARS-CoV-2 by FDA under an Emergency Use Authorization (EUA). This EUA will remain in effect (meaning this test can be used) for the duration of the COVID-19 declaration under Section 564(b)(1) of the Act, 21 U.S.C. section 360bbb-3(b)(1), unless the authorization is terminated  or revoked.  Performed at [facility], [address] [place], [place] [address]     Scheduled Meds:  ezetimibe   10 mg Oral Daily   insulin  aspart  0-9 Units Subcutaneous Q4H   pantoprazole (PROTONIX) IV  40 mg Intravenous Q12H   PARoxetine  40 mg Oral Daily   rOPINIRole  0.5 mg Oral QHS   simvastatin  40 mg Oral QHS   Continuous Infusions:  sodium chloride      sodium chloride  20 mL/hr at 01/08/24 2130    LOS: 2 days  Time spent: 35 minutes  Dr. Sherre [facility] If 7PM-7AM, please contact night-coverage 01/09/2024, 12:28 PM

## 2024-01-09 NOTE — Transfer of Care (Signed)
 Immediate Anesthesia Transfer of Care Note  Patient: [name]  Procedure(s) Performed: EGD (ESOPHAGOGASTRODUODENOSCOPY) COLONOSCOPY ESOPHAGOSCOPY, WITH BRUSH BIOPSY  Patient Location: PACU  Anesthesia Type:General  Level of Consciousness: drowsy and patient cooperative  Airway & Oxygen Therapy: Patient Spontanous Breathing  Post-op Assessment: Report given to RN and Post -op Vital signs reviewed and stable  Post vital signs: Reviewed and stable  Last Vitals:  Vitals Value Taken Time  BP 114/65 01/09/24 17:04  Temp    Pulse 91 01/09/24 17:04  Resp 16 01/09/24 17:04  SpO2 98 % 01/09/24 17:04  Vitals shown include unfiled device data.  Last Pain:  Vitals:   01/09/24 1614  TempSrc: Temporal  PainSc:          Complications: No notable events documented.

## 2024-01-09 NOTE — Op Note (Signed)
 [facility] Patient Name: [name] Procedure Date: 01/09/2024 4:29 PM MRN: [id number] Account #: [id number] Date of Birth: Nov 27, 1932 Admit Type: Outpatient Age: 89 Room: [facility] ENDO ROOM 2 Gender: Female Note Status: Finalized Instrument Name: Upper GI Scope [id number] Procedure:             Upper GI endoscopy Indications:           Acute post hemorrhagic anemia Providers:             Rogelia Copping MD, MD Referring MD:          Ophelia Sage, MD (Referring MD) Medicines:             Propofol per Anesthesia Complications:         No immediate complications. Procedure:             Pre-Anesthesia Assessment:                        - Prior to the procedure, a History and Physical was                         performed, and patient medications and allergies were                         reviewed. The patient's tolerance of previous                         anesthesia was also reviewed. The risks and benefits                         of the procedure and the sedation options and risks                         were discussed with the patient. All questions were                         answered, and informed consent was obtained. Prior                         Anticoagulants: The patient has taken no anticoagulant                         or antiplatelet agents. ASA Grade Assessment: II - A                         patient with mild systemic disease. After reviewing                         the risks and benefits, the patient was deemed in                         satisfactory condition to undergo the procedure.                        After obtaining informed consent, the endoscope was                         passed under direct vision. Throughout the procedure,  the patient's blood pressure, pulse, and oxygen                         saturations were monitored continuously. The Endoscope                         was introduced through the mouth,  and advanced to the                         third part of duodenum. The upper GI endoscopy was                         accomplished without difficulty. The patient tolerated                         the procedure well. Findings:      White nummular lesions were noted in the entire esophagus. Cells for       cytology were obtained by brushing.      A medium-sized hiatal hernia was present.      Few non-bleeding cratered gastric ulcers with a clean ulcer base       (Forrest Class III) were found in the gastric body and in the gastric       antrum.      The examined duodenum was normal. Impression:            - White nummular lesions in esophageal mucosa. Cells                         for cytology obtained.                        - Medium-sized hiatal hernia.                        - Non-bleeding gastric ulcers with a clean ulcer base                         (Forrest Class III).                        - Normal examined duodenum. Recommendation:        - Return patient to hospital ward for ongoing care.                        - Resume previous diet.                        - Continue present medications.                        - Continue present medications.                        - Use a proton pump inhibitor PO daily. Procedure Code(s):     --- Professional ---                        [phone], Esophagogastroduodenoscopy, flexible,                         transoral;  diagnostic, including collection of                         specimen(s) by brushing or washing, when performed                         (separate procedure) Diagnosis Code(s):     --- Professional ---                        D62, Acute posthemorrhagic anemia                        K25.9, Gastric ulcer, unspecified as acute or chronic,                         without hemorrhage or perforation CPT copyright 2022 American Medical Association. All rights reserved. The codes documented in this report are preliminary and upon coder  review may  be revised to meet current compliance requirements. Rogelia Copping MD, MD 01/09/2024 5:03:40 PM This report has been signed electronically. Number of Addenda: 0 Note Initiated On: 01/09/2024 4:29 PM Total Procedure Duration: 0 hours 3 minutes 33 seconds  Estimated Blood Loss:  Estimated blood loss: none.      [facility]

## 2024-01-09 NOTE — Op Note (Signed)
 [facility]astroenterology Patient Name: [name] Procedure Date: 01/09/2024 4:28 PM MRN: [id number] Account #: [id number] Date of Birth: [birth year] Admit Type: Inpatient Age: 89 Room: [facility] ENDO ROOM 2 Gender: Female Note Status: Finalized Instrument Name: Colon Scope [id number] Procedure:             Colonoscopy Indications:           Melena Providers:             Rogelia Copping MD, MD Referring MD:          Ophelia Sage, MD (Referring MD) Medicines:             Propofol per Anesthesia Complications:         No immediate complications. Procedure:             Pre-Anesthesia Assessment:                        - Prior to the procedure, a History and Physical was                         performed, and patient medications and allergies were                         reviewed. The patient's tolerance of previous                         anesthesia was also reviewed. The risks and benefits                         of the procedure and the sedation options and risks                         were discussed with the patient. All questions were                         answered, and informed consent was obtained. Prior                         Anticoagulants: The patient has taken no anticoagulant                         or antiplatelet agents. ASA Grade Assessment: II - A                         patient with mild systemic disease. After reviewing                         the risks and benefits, the patient was deemed in                         satisfactory condition to undergo the procedure.                        After obtaining informed consent, the colonoscope was                         passed under direct vision. Throughout the procedure,  the patient's blood pressure, pulse, and oxygen                         saturations were monitored continuously. The                         Colonoscope was introduced through the anus and                          advanced to the the cecum, identified by appendiceal                         orifice and ileocecal valve. The colonoscopy was                         performed without difficulty. The patient tolerated                         the procedure well. The quality of the bowel                         preparation was poor. Findings:      The perianal and digital rectal examinations were normal.      Hematin (altered blood/coffee-ground-like material) was found in the       entire colon. Impression:            - Preparation of the colon was poor.                        - Blood in the entire examined colon.                        - No specimens collected. Recommendation:        - Return patient to hospital ward for ongoing care.                        - Resume previous diet.                        - Continue present medications. Procedure Code(s):     --- Professional ---                        [phone], Colonoscopy, flexible; diagnostic, including                         collection of specimen(s) by brushing or washing, when                         performed (separate procedure) Diagnosis Code(s):     --- Professional ---                        K92.1, Melena (includes Hematochezia) CPT copyright 2022 American Medical Association. All rights reserved. The codes documented in this report are preliminary and upon coder review may  be revised to meet current compliance requirements. Rogelia Copping MD, MD 01/09/2024 5:05:09 PM This report has been signed electronically. Number of Addenda: 0 Note Initiated On: 01/09/2024 4:28 PM Scope Withdrawal Time: 0 hours 4 minutes 20 seconds  Total Procedure Duration:  0 hours 13 minutes 49 seconds  Estimated Blood Loss:  Estimated blood loss: none.      [facility]

## 2024-01-10 DIAGNOSIS — K922 Gastrointestinal hemorrhage, unspecified: Secondary | ICD-10-CM | POA: Diagnosis not present

## 2024-01-10 LAB — CBC
HCT: 29.6 % — ABNORMAL LOW (ref 36.0–46.0)
Hemoglobin: 9.7 g/dL — ABNORMAL LOW (ref 12.0–15.0)
MCH: 30.2 pg (ref 26.0–34.0)
MCHC: 32.8 g/dL (ref 30.0–36.0)
MCV: 92.2 fL (ref 80.0–100.0)
Platelets: 389 K/uL (ref 150–400)
RBC: 3.21 MIL/uL — ABNORMAL LOW (ref 3.87–5.11)
RDW: 13.2 % (ref 11.5–15.5)
WBC: 9.9 K/uL (ref 4.0–10.5)
nRBC: 0 % (ref 0.0–0.2)

## 2024-01-10 LAB — MAGNESIUM: Magnesium: 1.7 mg/dL (ref 1.7–2.4)

## 2024-01-10 LAB — BASIC METABOLIC PANEL WITH GFR
Anion gap: 10 (ref 5–15)
BUN: 15 mg/dL (ref 8–23)
CO2: 24 mmol/L (ref 22–32)
Calcium: 8.6 mg/dL — ABNORMAL LOW (ref 8.9–10.3)
Chloride: 102 mmol/L (ref 98–111)
Creatinine, Ser: 0.89 mg/dL (ref 0.44–1.00)
GFR, Estimated: >60 mL/min (ref >60)
Glucose, Bld: 132 mg/dL — ABNORMAL HIGH (ref 70–99)
Potassium: 4.2 mmol/L (ref 3.5–5.1)
Sodium: 136 mmol/L (ref 135–145)

## 2024-01-10 LAB — GLUCOSE, CAPILLARY
Glucose-Capillary: 111 mg/dL — ABNORMAL HIGH (ref 70–99)
Glucose-Capillary: 112 mg/dL — ABNORMAL HIGH (ref 70–99)
Glucose-Capillary: 113 mg/dL — ABNORMAL HIGH (ref 70–99)
Glucose-Capillary: 133 mg/dL — ABNORMAL HIGH (ref 70–99)
Glucose-Capillary: 152 mg/dL — ABNORMAL HIGH (ref 70–99)
Glucose-Capillary: 164 mg/dL — ABNORMAL HIGH (ref 70–99)

## 2024-01-10 MED ORDER — BENAZEPRIL HCL 20 MG PO TABS
30.0000 mg | ORAL_TABLET | Freq: Every day | ORAL | Status: DC
  Administered 2024-01-11: 30 mg via ORAL
  Filled 2024-01-10: qty 1

## 2024-01-10 MED ORDER — MAGNESIUM SULFATE 2 GM/50ML IV SOLN
2.0000 g | Freq: Once | INTRAVENOUS | Status: AC
  Administered 2024-01-10: 2 g via INTRAVENOUS
  Filled 2024-01-10: qty 50

## 2024-01-10 MED ORDER — AMLODIPINE BESYLATE 5 MG PO TABS
5.0000 mg | ORAL_TABLET | Freq: Every day | ORAL | Status: DC
  Administered 2024-01-11: 5 mg via ORAL
  Filled 2024-01-10: qty 1

## 2024-01-10 MED ORDER — MUSCLE RUB 10-15 % EX CREA
TOPICAL_CREAM | CUTANEOUS | Status: DC | PRN
  Administered 2024-01-10: 1 via TOPICAL
  Filled 2024-01-10: qty 85

## 2024-01-10 NOTE — Evaluation (Signed)
 Physical Therapy Evaluation Patient Details Name: [name] MRN: [id number] DOB: September 08, 1932 Today's Date: 01/10/2024  History of Present Illness  presented to ER secondary to progressive weakness; admitted for management of ABLA secondary to UGIB  Clinical Impression  Patient seated in recliner upon arrival to room; supportive daughter present at bedside.  Patient alert and oriented to basic information, follows commands and agreeable to participation with session. Mild memory deficits noted throughout session; baseline for patient. Generally weak and deconditioned throughout all extremities, but no focal weakness appreciated.  No pain reported. Able to complete sit/stand, basic transfers and gait (40') with RW, min assist.  Demonstrates very short, shuffling steps with limited balance reactions noted. Step length improved with cuing, but unable to maintain more than 5-6 steps or with cognitive distraction. Min/mod manual facilitation for weight shifting to optimally unweight and advance LEs throughout gait distance.  Do recommend continued use of RW and consistent +1 assist for all mobility tasks at this time. Would benefit from skilled PT to address above deficits and promote optimal return to PLOF.; recommend post-acute PT follow up as indicated by interdisciplinary care team.            If plan is discharge home, recommend the following: A little help with walking and/or transfers;A little help with bathing/dressing/bathroom   Can travel by private vehicle   Yes    Equipment Recommendations    Recommendations for Other Services       Functional Status Assessment Patient has had a recent decline in their functional status and demonstrates the ability to make significant improvements in function in a reasonable and predictable amount of time.     Precautions / Restrictions Precautions Precautions: Fall Restrictions Weight Bearing Restrictions Per Provider Order: No       Mobility  Bed Mobility               General bed mobility comments: seated in recliner beginning/end of treatment session    Transfers Overall transfer level: Needs assistance Equipment used: Rolling walker (2 wheels) Transfers: Sit to/from Stand Sit to Stand: Contact guard assist, Min assist           General transfer comment: multiple attempts, use of momentum and UE support required for lift off from seating surface.  Increased time/effort for anterior weight translation; limited balance reactions noted.    Ambulation/Gait Ambulation/Gait assistance: Min assist, Mod assist Gait Distance (Feet): 40 Feet Assistive device: Rolling walker (2 wheels)         General Gait Details: very short, shuffling steps with limited balance reactions noted.  Step length improved with cuing, but unable to maintain more than 5-6 steps or with cognitive distraction.  Min/mod manual facilitation for weight shifting to optimally unweight and advance LEs throughout gait distance.  Stairs            Wheelchair Mobility     Tilt Bed    Modified Rankin (Stroke Patients Only)       Balance Overall balance assessment: Needs assistance Sitting-balance support: No upper extremity supported, Feet supported Sitting balance-Leahy Scale: Good     Standing balance support: Bilateral upper extremity supported Standing balance-Leahy Scale: Fair                               Pertinent Vitals/Pain Pain Assessment Pain Assessment: No/denies pain    Home Living Family/patient expects to be discharged to:: Assisted living  Home Equipment: Agricultural consultant (2 wheels);Shower seat;Grab bars - toilet;Grab bars - tub/shower;Rollator (4 wheels);Toilet riser Additional Comments: [place]    Prior Function Prior Level of Function : Independent/Modified Independent             Mobility Comments: Pt reported being modI with household  ambulation with 2WW.   Enjoys going out to lunch with husband (dining hall or meals delivered to room for dinner).  Does endorse at least 2-3 falls in previous six months. 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  Covered wound to lateral surface of L calf.)       Communication   Communication Communication: No apparent difficulties    Cognition Arousal: Alert Behavior During Therapy: WFL for tasks assessed/performed   PT - Cognitive impairments: No apparent impairments                       PT - Cognition Comments: mild memory deficits noted during session Following commands: Intact       Cueing Cueing Techniques: Verbal cues, Gestural cues, Tactile cues     General Comments      Exercises     Assessment/Plan    PT Assessment Patient needs continued PT services  PT Problem List Decreased strength;Decreased activity tolerance;Decreased balance;Decreased mobility;Decreased coordination;Decreased cognition;Decreased knowledge of use of DME;Decreased safety awareness;Decreased knowledge of precautions       PT Treatment Interventions DME instruction;Gait training;Stair training;Functional mobility training;Therapeutic activities;Therapeutic exercise;Balance training;Cognitive remediation;Patient/family education    PT Goals (Current goals can be found in the Care Plan section)  Acute Rehab PT Goals Patient Stated Goal: to go to rehab and get stronger PT Goal Formulation: With patient/family Time For Goal Achievement: 01/24/24 Potential to Achieve Goals: Good    Frequency Min 2X/week     Co-evaluation               AM-PAC PT 6  Clicks Mobility  Outcome Measure Help needed turning from your back to your side while in a flat bed without using bedrails?: None Help needed moving from lying on your back to sitting on the side of a flat bed without using bedrails?: A Little Help needed moving to and from a bed to a chair (including a wheelchair)?: A Little Help needed standing up from a chair using your arms (e.g., wheelchair or bedside chair)?: A Little Help needed to walk in hospital room?: A Lot Help needed climbing 3-5 steps with a railing? : A Lot 6 Click Score: 17    End of Session Equipment Utilized During Treatment: Gait belt Activity Tolerance: Patient tolerated treatment well Patient left: in chair;with call bell/phone within reach;with chair alarm set;with family/visitor present Nurse Communication: Mobility status PT Visit Diagnosis: Muscle weakness (generalized) (M62.81);Difficulty in walking, not elsewhere classified (R26.2)    Time: 8897-8874 PT Time Calculation (min) (ACUTE ONLY): 23 min   Charges:   PT Evaluation $PT Eval Moderate Complexity: 1 Mod   PT General Charges $$ ACUTE PT VISIT: 1 Visit         Alaa Eyerman H. Delores, PT, DPT, NCS 01/10/24, 11:41 AM [phone]

## 2024-01-10 NOTE — NC FL2 (Signed)
 [place]  MEDICAID FL2 LEVEL OF CARE FORM     IDENTIFICATION  Patient Name: [name] Birthdate: 04/11/1932 Sex: female Admission Date (Current Location): 01/06/2024  [facility] and [place] Number:  Chiropodist and Address:  [facility], [address] [place], [place] [address]      Provider Number: [id number]  Attending Physician Name and Address:  Franchot Novel, MD  Relative Name and Phone Number:  Elouise Sharps [phone]    Current Level of Care: Hospital Recommended Level of Care: Skilled Nursing Facility Prior Approval Number:    Date Approved/Denied:   PASRR Number:  [id number]  Discharge Plan: SNF    Current Diagnoses: Patient Active Problem List   Diagnosis Date Noted   Acute gastric ulcer without hemorrhage or perforation 01/09/2024   Restless leg syndrome due to iron deficiency anemia 01/08/2024   Acute blood loss anemia (ABLA) 01/06/2024   Upper GI bleeding, suspect acute on chronic 01/06/2024   Electrolyte abnormality 01/06/2024   SIRS with lactic acidosis (systemic inflammatory response syndrome) (HCC) 01/06/2024   UTI (urinary tract infection) 10/18/2023   Type II diabetes mellitus with renal manifestations (HCC) 10/18/2023   Depression 10/18/2023   Chronic kidney disease, stage 3a (HCC) 10/18/2023   Hyperkalemia 10/18/2023   Aortic atherosclerosis 05/23/2019   ILD (interstitial lung disease) (HCC) 05/23/2019   Benign essential hypertension 04/16/2019   Pure hypercholesterolemia 04/16/2019   Recurrent major depressive disorder, in full remission 12/06/2017   Foot pain 02/09/2016   Senile purpura 04/14/2015   Osteopenia 09/30/2013   HTN (hypertension) 11/08/2010   Hyperlipidemia 11/08/2010   Chest discomfort 11/08/2010   Controlled type 2 diabetes mellitus without complication, without long-term current use of insulin  (HCC) 11/08/2010    Orientation RESPIRATION BLADDER Height & Weight     Self, Time,  Situation, Place    External catheter Weight: 118 lb 13.3 oz (53.9 kg) Height:  5' 6 (167.6 cm)  BEHAVIORAL SYMPTOMS/MOOD NEUROLOGICAL BOWEL NUTRITION STATUS      Continent Diet (DIET SOFT Fluid consistency: Thin: Bland starting at 10/14 1748)  AMBULATORY STATUS COMMUNICATION OF NEEDS Skin   Limited Assist Verbally Other (Comment) (Pt has generalized edema and thin fragile skin.)                       Personal Care Assistance Level of Assistance              Functional Limitations Info             SPECIAL CARE FACTORS FREQUENCY  PT (By licensed PT), OT (By licensed OT)     PT Frequency: 5x OT Frequency: 5x            Contractures      Additional Factors Info                  Current Medications (01/10/2024):  This is the current hospital active medication list Current Facility-Administered Medications  Medication Dose Route Frequency Provider Last Rate Last Admin   0.9 %  sodium chloride  infusion  10 mL/hr Intravenous Once Wohl, Darren, MD       acetaminophen  (TYLENOL ) tablet 650 mg  650 mg Oral Q6H PRN Jinny Carmine, MD       Or   acetaminophen  (TYLENOL ) suppository 650 mg  650 mg Rectal Q6H PRN Jinny Carmine, MD       albuterol  (PROVENTIL ) (2.5 MG/3ML) 0.083% nebulizer solution 2.5 mg  2.5 mg Nebulization Q2H PRN  Jinny Carmine, MD       ezetimibe  (ZETIA ) tablet 10 mg  10 mg Oral Daily Wohl, Darren, MD   10 mg at 01/10/24 0830   hydrALAZINE  (APRESOLINE ) injection 5 mg  5 mg Intravenous Q6H PRN Jinny Carmine, MD       HYDROcodone-acetaminophen  (NORCO/VICODIN) 5-325 MG per tablet 1-2 tablet  1-2 tablet Oral Q4H PRN Jinny Carmine, MD   1 tablet at 01/10/24 0141   insulin  aspart (novoLOG ) injection 0-9 Units  0-9 Units Subcutaneous Q4H Jinny Carmine, MD   2 Units at 01/10/24 1258   melatonin tablet 5 mg  5 mg Oral QHS PRN Jinny Carmine, MD   5 mg at 01/07/24 2056   metoprolol tartrate (LOPRESSOR) injection 2.5 mg  2.5 mg Intravenous Q4H PRN Jinny Carmine, MD        ondansetron  (ZOFRAN ) tablet 4 mg  4 mg Oral Q6H PRN Jinny Carmine, MD       Or   ondansetron  (ZOFRAN ) injection 4 mg  4 mg Intravenous Q6H PRN Jinny Carmine, MD       pantoprazole (PROTONIX) injection 40 mg  40 mg Intravenous Q12H Jinny Carmine, MD   40 mg at 01/10/24 0830   PARoxetine (PAXIL) tablet 40 mg  40 mg Oral Daily Wohl, Darren, MD   40 mg at 01/10/24 0830   rOPINIRole (REQUIP) tablet 0.5 mg  0.5 mg Oral QHS Jinny Carmine, MD   0.5 mg at 01/09/24 2103   simvastatin (ZOCOR) tablet 40 mg  40 mg Oral QHS Wohl, Darren, MD   40 mg at 01/09/24 2103     Discharge Medications: Please see discharge summary for a list of discharge medications.  Relevant Imaging Results:  Relevant Lab Results:   Additional Information [id number]  Alfonso Rummer, LCSW

## 2024-01-10 NOTE — TOC Progression Note (Signed)
 Transition of Care [facility]) - Progression Note    Patient Details  Name: LATASIA SILBERSTEIN MRN: [id number] Date of Birth: Oct 02, 1932  [facility] CM/SW Contact  Alfonso Rummer, LCSW Phone Number: 01/10/2024, 5:27 PM  Clinical Narrative:     KEN DELENA Rummer met with patient and daughter Elouise in room 108. Ms Mantell is a resident of [address] of [place] and agree to transition back for short term rehab. LCSW A Angelisse Riso spk with [facility] and reports they can provider short term pt and pt for Ms. Krack. [place] completed and will send dx summary once pt is medically ready.                    Expected Discharge Plan and Services                                               Social Drivers of Health (SDOH) Interventions SDOH Screenings   Food Insecurity: No Food Insecurity (01/08/2024)  Housing: Low Risk  (01/08/2024)  Transportation Needs: No Transportation Needs (01/08/2024)  Utilities: Not At Risk (01/08/2024)  Social Connections: Moderately Isolated (01/08/2024)  Tobacco Use: Low Risk  (01/06/2024)    Readmission Risk Interventions     No data to display

## 2024-01-10 NOTE — Progress Notes (Addendum)
 PROGRESS NOTE  [name]  FMW:[id number] DOB: 01-22-1933 DOA: 01/06/2024 PCP: [name], MD   [name] is an 89 year old female with history of hypertension, non-insulin -dependent diabetes mellitus, depression, CKD 3A, cognitive impairment, interstitial lung disease, presents to ED for chief concerns of weakness.  01/06/2024: Patient was admitted for suspected upper GI bleed.  GI service has been made aware and formally consulted for upper endoscopy.  01/07/2024: Hemoglobin improved to 9.7 today.  Clear liquid diet.  N.p.o. after midnight.  In anticipation for endoscopy on 01/08/2024.  01/08/2024: Per nursing, prep was not given last evening.  Endoscopy has been canceled for today and patient has been placed on endoscopy for tomorrow.  Gastroenterologist has been made aware.  Prep has been reordered and discussed with RN that this needs to be given today as ordered.  This has been acknowledged by Engineer, manufacturing.    Clear liquid diet (no red Jell-O, cranberry juice, beef broth, no red/brown/purple liquid.  N.p.o. after midnight except for sips of meds.  Polyethylene glycol powder container mix with Gatorade reordered again for administration on 01/08/2024 at 1800 hrs.  Discussed with nursing at bedside and via secure chat to ensure administration.  Discussed with patient and daughter at bedside to ensure that patient receives this.  01/09/24: EGD today moved to afternoon as patient is not achieved appropriate prep.  Daughter reports the medications given helped her a little bit so she got some rest. Patient had symptoms again early this morning.   Requip 0.5 mg at bedtime ordered. Daughter expresses wish for Rx of this medication on discharge.  PT, OT, TOC consulted for snf.     Assessment & Plan:   Principal Problem:   Upper GI bleeding, suspect acute on chronic Active Problems:   Acute blood loss anemia (ABLA)   HTN (hypertension)   ILD (interstitial lung disease)  (HCC)   Type II diabetes mellitus with renal manifestations (HCC)   Chronic kidney disease, stage 3a (HCC)   Hyperlipidemia   Depression   SIRS with lactic acidosis (systemic inflammatory response syndrome) (HCC)   Aortic atherosclerosis   Electrolyte abnormality   Restless leg syndrome due to iron deficiency anemia   Acute gastric ulcer without hemorrhage or perforation   Assessment and Plan:  * Upper GI bleeding, suspect acute on chronic Acute blood loss anemia Lactic acidosis with SIRS  Possible NSAID induced gastropathy Patient presenting with progressive weakness, hemoglobin drop in 3 days from 9.7-6.4 Lactic acidosis/leukocytosis/SIRS criteria  likely related to tissue hypoperfusion from GI bleed Received an IV fluid bolus in the ED Continue transfusion of up to 2 unit PRBCs with serial  Additional IV hydration to maintain adequate perfusion Continue Protonix IV twice daily, antiemetics Continue hold aspirin and meloxicam GI aware and formally consulted, Dr. Jinny aware Close hemodynamic monitoring in progressive care unit for signs of bleeding/decompensation  01/08/2024: Per nursing, prep was not given last evening.  Endoscopy has been canceled for today and patient has been placed on endoscopy for tomorrow.  Gastroenterologist has been made aware.  Prep has been reordered and discussed with RN that this needs to be given today as ordered.  This has been acknowledged by Engineer, manufacturing.    Clear liquid diet (no red Jell-O, cranberry juice, beef broth, no red/brown/purple liquid.  N.p.o. after midnight except for sips of meds.  Polyethylene glycol powder container mix with Gatorade reordered again for administration on 01/08/2024 at 1800 hrs.  Discussed with nursing at bedside  and via secure chat to ensure administration.  Discussed with patient and daughter at bedside to ensure that patient receives this.  01/09/24: EGD today moved to afternoon as patient is not achieved appropriate  prep.  Poor colonic prep.  Blood was noted in the colon.  EGD with nonbleeding gastric ulcers.  Normal duodenum.  Continue PPI daily   Chronic kidney disease, stage 3a (HCC) Renal function at baseline  Type II diabetes mellitus with renal manifestations (HCC) Sliding scale insulin  coverage  ILD (interstitial lung disease) (HCC) Not overtly symptomatic Followed by pulmonology Albuterol  as needed  HTN (hypertension) Patient currently not taking amlodipine 5 mg daily, benazepril  20 mg daily, hydrochlorothiazide 25 mg daily Will resume patient's amlodipine and benazepril  in the a.m.  SIRS with lactic acidosis (systemic inflammatory response syndrome) (HCC) present on arrival Tachycardia, tachypnea, leukocytosis and lactic acidosis At this time believe related to GI bleed Low suspicion for infection.  Chest x-ray and UA and respiratory viral panel negative   Depression Continue paroxetine with lorazepam as needed  Hyperlipidemia Simvastatin 40 mg nightly, ezetimibe  10 mg daily were resumed  Restless leg syndrome due to iron deficiency anemia Continue outpatient iron supplementation I will order iron supplementation after endoscopy Ropinirole 0.25 mg p.o. twice daily initiated, 2 doses ordered for today Will assess patient tomorrow morning for effectiveness and can increase dosing strength as needed  01/09/24: daughter reports the medications given helped her a little bit so she got some rest. Patient had symptoms again early this morning.   Requip 0.5 mg at bedtime ordered. Daughter expresses wish for Rx of this medication on discharge.  Electrolyte abnormality Hyponatremia resolved with fluids  Aortic atherosclerosis Holding aspirin due to GI bleed   DVT prophylaxis: Pharmacologic DVT not initiated due to egd procedre.  AM team to initiate pharmacologic DVT when the benefits outweigh the risk. Code Status: full code Family Communication: updated daughter at  bedside Disposition Plan: pending clinical course Level of care: Progressive  Consultants:  GI, PT, OT, TOC  Procedures:  Egd anticipate today  Antimicrobials: None indicated   Subjective:  No pain.  Objective: Vitals:   01/10/24 0508 01/10/24 0724 01/10/24 1237 01/10/24 1715  BP: (!) 142/70 (!) 144/66 121/79 (!) 144/76  Pulse: 79 84 (!) 104 (!) 108  Resp: 19 16 18    Temp: 98 F (36.7 C) 98.2 F (36.8 C) 98.1 F (36.7 C) 97.7 F (36.5 C)  TempSrc: Oral     SpO2: 96% 97% 98% 99%  Weight: 53.9 kg     Height:       Intake/Output Summary (Last 24 hours) at 01/10/2024 1816 Last data filed at 01/10/2024 9075 Gross per 24 hour  Intake 1110.08 ml  Output 350 ml  Net 760.08 ml   Filed Weights   01/06/24 1757 01/10/24 0508  Weight: 58.6 kg 53.9 kg   Examination:   Physical Exam  Constitutional: In no distress.  Cardiovascular: Normal rate, regular rhythm. No lower extremity edema  Pulmonary: Non labored breathing on room air, no wheezing or rales.   Abdominal: Soft. Non distended and non tender Musculoskeletal: Normal range of motion.     Neurological: Alert and oriented to person, place, and time. Non focal  Skin: Skin is warm and dry. Areas of skin breakdown, do not appear infected.    Psychiatry: Judgement and insight appear normal. Mood & affect appropriate.   Data Reviewed: I have personally reviewed following labs and imaging studies  CBC: Recent Labs  Lab 01/06/24  1759 01/07/24 0143 01/07/24 0924 01/07/24 1136 01/08/24 0510 01/09/24 0423 01/10/24 0349  WBC 13.9*   < > 9.6 8.8 9.2 9.0 9.9  NEUTROABS 9.4*  --   --   --   --   --   --   HGB 6.4*   < > 9.7* 8.5* 10.6* 10.1* 9.7*  HCT 21.0*   < > 29.2* 24.9* 31.9* 30.1* 29.6*  MCV 95.9   < > 92.1 89.2 90.1 92.0 92.2  PLT 394   < > 322 340 354 362 389   < > = values in this interval not displayed.   Basic Metabolic Panel: Recent Labs  Lab 01/06/24 1759 01/07/24 1136 01/08/24 0510  01/09/24 0423 01/10/24 0344 01/10/24 0349  NA 132* 136 138 137  --  136  K 4.5 4.1 4.3 4.6  --  4.2  CL 100 104 104 104  --  102  CO2 19* 20* 26 25  --  24  GLUCOSE 163* 167* 137* 132*  --  132*  BUN 76* 39* 24* 18  --  15  CREATININE 1.05* 0.87 0.71 0.91  --  0.89  CALCIUM 8.3* 8.1* 8.7* 8.9  --  8.6*  MG  --   --   --   --  1.7  --    GFR: Estimated Creatinine Clearance: 37.2 mL/min (by C-G formula based on SCr of 0.89 mg/dL).  Liver Function Tests: Recent Labs  Lab 01/06/24 1759  AST 25  ALT 15  ALKPHOS 65  BILITOT 0.3  PROT 6.1*  ALBUMIN 2.8*   Coagulation Profile: Recent Labs  Lab 01/06/24 1759  INR 1.0   HbA1C: No results for input(s): HGBA1C in the last 72 hours.  CBG: Recent Labs  Lab 01/10/24 0020 01/10/24 0509 01/10/24 0726 01/10/24 1238 01/10/24 1718  GLUCAP 112* 113* 111* 152* 133*   Thyroid Function Tests: No results for input(s): TSH, T4TOTAL, FREET4, T3FREE, THYROIDAB in the last 72 hours.  Sepsis Labs: Recent Labs  Lab 01/06/24 1759 01/06/24 1945  LATICACIDVEN 2.6* 1.7   Recent Results (from the past 240 hours)  Urine Culture     Status: Abnormal   Collection Time: 01/03/24 11:23 AM   Specimen: Urine, Clean Catch  Result Value Ref Range Status   Specimen Description   Final    URINE, CLEAN CATCH Performed at [facility], [address], [place], [place] [address]    Special Requests   Final    NONE Performed at [facility], [address], [place], [place] [address]    Culture (A)  Final    <10,000 COLONIES/mL INSIGNIFICANT GROWTH Performed at [facility], [address] [place], [place] [address]    Report Status 01/04/2024 FINAL  Final  Blood Culture (routine x 2)     Status: None (Preliminary result)   Collection Time: 01/06/24  5:59 PM   Specimen: BLOOD  Result Value Ref Range Status   Specimen Description BLOOD BLOOD LEFT HAND  Final   Special Requests   Final    BOTTLES  DRAWN AEROBIC AND ANAEROBIC Blood Culture results may not be optimal due to an inadequate volume of blood received in culture bottles   Culture   Final    NO GROWTH 4 DAYS Performed at [facility], [address] [place], [place] [address]    Report Status PENDING  Incomplete  Blood Culture (routine x 2)     Status: None (Preliminary result)  Collection Time: 01/06/24  5:59 PM   Specimen: BLOOD  Result Value Ref Range Status   Specimen Description BLOOD RIGHT ANTECUBITAL  Final   Special Requests   Final    BOTTLES DRAWN AEROBIC AND ANAEROBIC Blood Culture adequate volume   Culture   Final    NO GROWTH 4 DAYS Performed at [facility], [address], [place], [place] [address]    Report Status PENDING  Incomplete  Resp panel by RT-PCR (RSV, Flu A&B, Covid) Urine, Clean Catch     Status: None   Collection Time: 01/06/24  5:59 PM   Specimen: Urine, Clean Catch; Nasal Swab  Result Value Ref Range Status   SARS Coronavirus 2 by RT PCR NEGATIVE NEGATIVE Final    Comment: (NOTE) SARS-CoV-2 target nucleic acids are NOT DETECTED.  The SARS-CoV-2 RNA is generally detectable in upper respiratory specimens during the acute phase of infection. The lowest concentration of SARS-CoV-2 viral copies this assay can detect is 138 copies/mL. A negative result does not preclude SARS-Cov-2 infection and should not be used as the sole basis for treatment or other patient management decisions. A negative result may occur with  improper specimen collection/handling, submission of specimen other than nasopharyngeal swab, presence of viral mutation(s) within the areas targeted by this assay, and inadequate number of viral copies(<138 copies/mL). A negative result must be combined with clinical observations, patient history, and epidemiological information. The expected result is Negative.  Fact Sheet for Patients:  BloggerCourse.com  Fact Sheet for  Healthcare Providers:  SeriousBroker.it  This test is no t yet approved or cleared by the [place]  FDA and  has been authorized for detection and/or diagnosis of SARS-CoV-2 by FDA under an Emergency Use Authorization (EUA). This EUA will remain  in effect (meaning this test can be used) for the duration of the COVID-19 declaration under Section 564(b)(1) of the Act, 21 U.S.C.section 360bbb-3(b)(1), unless the authorization is terminated  or revoked sooner.       Influenza A by PCR NEGATIVE NEGATIVE Final   Influenza B by PCR NEGATIVE NEGATIVE Final    Comment: (NOTE) The Xpert Xpress SARS-CoV-2/FLU/RSV plus assay is intended as an aid in the diagnosis of influenza from Nasopharyngeal swab specimens and should not be used as a sole basis for treatment. Nasal washings and aspirates are unacceptable for Xpert Xpress SARS-CoV-2/FLU/RSV testing.  Fact Sheet for Patients: BloggerCourse.com  Fact Sheet for Healthcare Providers: SeriousBroker.it  This test is not yet approved or cleared by the [place]  FDA and has been authorized for detection and/or diagnosis of SARS-CoV-2 by FDA under an Emergency Use Authorization (EUA). This EUA will remain in effect (meaning this test can be used) for the duration of the COVID-19 declaration under Section 564(b)(1) of the Act, 21 U.S.C. section 360bbb-3(b)(1), unless the authorization is terminated or revoked.     Resp Syncytial Virus by PCR NEGATIVE NEGATIVE Final    Comment: (NOTE) Fact Sheet for Patients: BloggerCourse.com  Fact Sheet for Healthcare Providers: SeriousBroker.it  This test is not yet approved or cleared by the [place]  FDA and has been authorized for detection and/or diagnosis of SARS-CoV-2 by FDA under an Emergency Use Authorization (EUA). This EUA will remain in effect (meaning this  test can be used) for the duration of the COVID-19 declaration under Section 564(b)(1) of the Act, 21 U.S.C. section 360bbb-3(b)(1), unless the authorization is terminated or revoked.  Performed at [facility], [address], [place], [place] [address]  KOH prep     Status: None   Collection Time: 01/09/24  5:30 PM   Specimen: Esophagus  Result Value Ref Range Status   Specimen Description ESOPHAGUS  Final   Special Requests NONE  Final   KOH Prep   Final    YEAST WITH PSEUDOHYPHAE Performed at [facility], [address] [place], [place] [address]    Report Status 01/09/2024 FINAL  Final    Scheduled Meds:  ezetimibe   10 mg Oral Daily   insulin  aspart  0-9 Units Subcutaneous Q4H   pantoprazole (PROTONIX) IV  40 mg Intravenous Q12H   PARoxetine  40 mg Oral Daily   rOPINIRole  0.5 mg Oral QHS   simvastatin  40 mg Oral QHS   Continuous Infusions:  sodium chloride      magnesium sulfate bolus IVPB 2 g (01/10/24 1803)    LOS: 3 days   Time spent: 35 minutes  Dr. Franchot [facility] If 7PM-7AM, please contact night-coverage 01/10/2024, 6:16 PM

## 2024-01-10 NOTE — Progress Notes (Signed)
 OT Cancellation Note  Patient Details Name: ATIA HAUPT MRN: [id number] DOB: Mar 30, 1932   Cancelled Treatment:    Reason Eval/Treat Not Completed: Pain limiting ability to participate;Other (comment) (OT attempted 3x, first time patient wanted to eat lunch, 2nd time patient wanted to nap, 3rd time, patient having leg cramps; RN notified.) OT will re attempt when able to  Maryelizabeth CHRISTELLA Clause 01/10/2024, 3:08 PM

## 2024-01-11 ENCOUNTER — Telehealth (HOSPITAL_COMMUNITY): Payer: Self-pay | Admitting: Student

## 2024-01-11 DIAGNOSIS — K922 Gastrointestinal hemorrhage, unspecified: Secondary | ICD-10-CM | POA: Diagnosis not present

## 2024-01-11 LAB — CULTURE, BLOOD (ROUTINE X 2)
Culture: NO GROWTH
Culture: NO GROWTH
Special Requests: ADEQUATE

## 2024-01-11 LAB — IRON AND TIBC
Iron: 18 ug/dL — ABNORMAL LOW (ref 28–170)
Saturation Ratios: 5 % — ABNORMAL LOW (ref 10.4–31.8)
TIBC: 332 ug/dL (ref 250–450)
UIBC: 314 ug/dL

## 2024-01-11 LAB — GLUCOSE, CAPILLARY
Glucose-Capillary: 129 mg/dL — ABNORMAL HIGH (ref 70–99)
Glucose-Capillary: 136 mg/dL — ABNORMAL HIGH (ref 70–99)
Glucose-Capillary: 182 mg/dL — ABNORMAL HIGH (ref 70–99)
Glucose-Capillary: 77 mg/dL (ref 70–99)

## 2024-01-11 LAB — HEMOGLOBIN AND HEMATOCRIT, BLOOD
HCT: 30.4 % — ABNORMAL LOW (ref 36.0–46.0)
Hemoglobin: 9.9 g/dL — ABNORMAL LOW (ref 12.0–15.0)

## 2024-01-11 LAB — POTASSIUM: Potassium: 4 mmol/L (ref 3.5–5.1)

## 2024-01-11 LAB — MAGNESIUM: Magnesium: 2.3 mg/dL (ref 1.7–2.4)

## 2024-01-11 LAB — FERRITIN: Ferritin: 13 ng/mL (ref 11–307)

## 2024-01-11 MED ORDER — PANTOPRAZOLE SODIUM 40 MG PO TBEC: 40.0000 mg | DELAYED_RELEASE_TABLET | Freq: Every day | ORAL | Status: AC

## 2024-01-11 MED ORDER — ROPINIROLE HCL 0.5 MG PO TABS: 0.5000 mg | ORAL_TABLET | Freq: Every day | ORAL | Status: AC

## 2024-01-11 MED ORDER — POLYETHYLENE GLYCOL 3350 17 G PO PACK: 17.0000 g | PACK | Freq: Every day | ORAL | Status: AC

## 2024-01-11 MED ORDER — HYDROCODONE-ACETAMINOPHEN 5-325 MG PO TABS: 1.0000 | ORAL_TABLET | Freq: Two times a day (BID) | ORAL | 0 refills | Status: AC | PRN

## 2024-01-11 MED ORDER — ACETAMINOPHEN 325 MG PO TABS: 650.0000 mg | ORAL_TABLET | Freq: Four times a day (QID) | ORAL | Status: AC | PRN

## 2024-01-11 MED ORDER — MUSCLE RUB 10-15 % EX CREA: 1.0000 | TOPICAL_CREAM | CUTANEOUS | Status: AC | PRN

## 2024-01-11 MED ORDER — IRON 325 (65 FE) MG PO TABS: 1.0000 | ORAL_TABLET | Freq: Every day | ORAL | Status: AC

## 2024-01-11 MED ORDER — HYDROCHLOROTHIAZIDE 25 MG PO TABS: 12.5000 mg | ORAL_TABLET | Freq: Every day | ORAL | Status: AC

## 2024-01-11 NOTE — Telephone Encounter (Addendum)
 Patient referred to infusion pharmacy team for ambulatory infusion of IV iron.  Insurance - Medicare  Site of care - Site of care: [facility] INF Dx code - K92.2/G25.81/D50.9  IV Iron Therapy - Feraheme 510 mg iv x 2  Infusion appointments - Scheduling team will schedule patient as soon as possible.   Luster Hechler D. Kaesyn Johnston, PharmD

## 2024-01-11 NOTE — Discharge Summary (Addendum)
 Physician Discharge Summary   Patient: [name] MRN: [id number] DOB: Jun 28, 1932  Admit date:     01/06/2024  Discharge date: 01/11/24  Discharge Physician: Alban Pepper   PCP: [name], MD   Recommendations at discharge:   Please recheck CBC in 1 week to ensure hemoglobin is stable Please follow-up on iron studies Will need to see if patient is tolerating Requip Follow up on pathology from white nummular lesions in esophgeal mucosa.    Discharge Diagnoses: Principal Problem:   Upper GI bleeding, suspect acute on chronic Active Problems:   Acute blood loss anemia (ABLA)   HTN (hypertension)   ILD (interstitial lung disease) (HCC)   Type II diabetes mellitus with renal manifestations (HCC)   Chronic kidney disease, stage 3a (HCC)   Hyperlipidemia   Depression   SIRS with lactic acidosis (systemic inflammatory response syndrome) (HCC)   Aortic atherosclerosis   Electrolyte abnormality   Restless leg syndrome due to iron deficiency anemia   Acute gastric ulcer without hemorrhage or perforation  Resolved Problems:   * No resolved hospital problems. [facility] Course: [name] is an 89 year old female with history of hypertension, non-insulin -dependent diabetes mellitus, depression, CKD 3A, cognitive impairment, interstitial lung disease, presents to ED for chief concerns of weakness.  01/06/2024: Patient was admitted for suspected upper GI bleed.  GI service has been made aware and formally consulted for upper endoscopy.  01/07/2024: Hemoglobin improved to 9.7 today.  Clear liquid diet.  N.p.o. after midnight.  In anticipation for endoscopy on 01/08/2024.  01/08/2024: Per nursing, prep was not given last evening.  Endoscopy has been canceled for today and patient has been placed on endoscopy for tomorrow.  Gastroenterologist has been made aware.  Prep has been reordered and discussed with RN that this needs to be given today as ordered.  This has  been acknowledged by Engineer, manufacturing.    Clear liquid diet (no red Jell-O, cranberry juice, beef broth, no red/brown/purple liquid.  N.p.o. after midnight except for sips of meds.  Polyethylene glycol powder container mix with Gatorade reordered again for administration on 01/08/2024 at 1800 hrs.  Discussed with nursing at bedside and via secure chat to ensure administration.  Discussed with patient and daughter at bedside to ensure that patient receives this.  01/09/24: EGD today moved to afternoon as patient is not achieved appropriate prep.  Daughter reports the medications given helped her a little bit so she got some rest. Patient had symptoms again early this morning.   01/10/24 EGD showed gastric ulcers, nonbleeding. Also noted to have white nummular lesions in esophageal mucosa. Colonoscopy poor prep but blood was noted throughout colon. She has had no further melenic stools. Did note that she had non bloody stools with prep.   01/11/24 Continues to have some muscle cramps.  She feels that the Requip works but does not have lasting relief from this.  Encourage patient to try topical medications like muscle creams to help with her symptoms.  Magnesium and potassium were within normal limits.  Iron panel was pending at the time of discharge.    Assessment and Plan: * Upper GI bleeding, suspect acute on chronic Acute blood loss anemia Gastric ulcers Had EGD, notable for gastric ulcers and white nummular lesions of esophageal mucosa.  This is likely in the setting of her NSAIDs.  Aspirin and meloxicam were stopped on discharge.  She will also continue on Protonix daily.  AKI resolved Chronic kidney disease, stage  3a [facility]) Previously stated that patient has CKD 3A.  Her GFR has been greater than 60.  She likely does not have CKD 3.  Her serum creatinine was elevated on admission and resolved with administration of blood.  Type II diabetes mellitus with renal manifestations  (HCC) Well-controlled.  Recent A1c 6.1. Patient will discharge on metformin.  ILD (interstitial lung disease) (HCC) Not acutely exacerbated. Followed by pulmonology Albuterol  as needed  HTN (hypertension) Discussed with patient's son who is a cardiologist in [place] .  Patient no longer takes ACE/ARB as she had hyperkalemia and orthostasis.  She is currently on amlodipine 5 mg daily and hydrochlorothiazide.  She will continue amlodipine and hydrochlorothiazide on discharge.  SIRS with lactic acidosis (systemic inflammatory response syndrome) (HCC) present on arrival Tachycardia, tachypnea, leukocytosis and lactic acidosis Likely related to GI bleed Low suspicion for infection.  Chest x-ray and UA and respiratory viral panel negative No reports of change in bowel habits  Depression Continue paroxetine with lorazepam as needed  Hyperlipidemia Simvastatin 40 mg nightly, ezetimibe  10 mg daily were resumed and continued on discharge  Restless leg syndrome due to iron deficiency anemia Will refer patient for outpatient IV iron therapy and start oral iron.  Encouraged patient to use muscle rubs to help with cramps. She will also discharge with a short course of hydrocodone as this was effective for her acutely.   Hyponatremia Resolved.   Aortic atherosclerosis Holding  aspirin on discharge.  Continuing statin and Zetia .        Pain control - [place]  Controlled Substance Reporting System database was reviewed. and patient was instructed, not to drive, operate heavy machinery, perform activities at heights, swimming or participation in water activities or provide baby-sitting services while on Pain, Sleep and Anxiety Medications; until their outpatient Physician has advised to do so again. Also recommended to not to take more than prescribed Pain, Sleep and Anxiety Medications.  Consultants: GI  Procedures performed: EGD, colonoscopy   Disposition: Skilled nursing  facility Diet recommendation:  Regular diet DISCHARGE MEDICATION: Allergies as of 01/11/2024       Reactions   Tetanus Antitoxin Rash, Shortness Of Breath   Tetanus Toxoid-containing Vaccines Shortness Of Breath, Rash        Medication List     PAUSE taking these medications    meloxicam 15 MG tablet Wait to take this until your doctor or other care provider tells you to start again. Commonly known as: MOBIC Take 15 mg by mouth daily.       STOP taking these medications    aspirin EC 81 MG tablet   atorvastatin 20 MG tablet Commonly known as: LIPITOR   benazepril  20 MG tablet Commonly known as: LOTENSIN    cefpodoxime 200 MG tablet Commonly known as: VANTIN   fexofenadine 180 MG tablet Commonly known as: ALLEGRA       TAKE these medications    acetaminophen  325 MG tablet Commonly known as: TYLENOL  Take 2 tablets (650 mg total) by mouth every 6 (six) hours as needed for mild pain (pain score 1-3) or fever (or Fever >/= 101).   amLODipine 5 MG tablet Commonly known as: NORVASC Take 5 mg by mouth daily.   ezetimibe  10 MG tablet Commonly known as: ZETIA  Take 1 tablet (10 mg total) by mouth daily.   hydrochlorothiazide 25 MG tablet Commonly known as: HYDRODIURIL Take 0.5 tablets (12.5 mg total) by mouth daily. What changed: how much to take   HYDROcodone-acetaminophen  5-325 MG tablet Commonly  known as: NORCO/VICODIN Take 1-2 tablets by mouth every 12 (twelve) hours as needed for severe pain (pain score 7-10).   metFORMIN 500 MG tablet Commonly known as: GLUCOPHAGE Take 500 mg by mouth 2 (two) times daily.   Multi-Vitamin tablet Take 1 tablet by mouth daily.   Muscle Rub 10-15 % Crea Apply 1 Application topically as needed for muscle pain.   pantoprazole 40 MG tablet Commonly known as: Protonix Take 1 tablet (40 mg total) by mouth daily.   PARoxetine 40 MG tablet Commonly known as: PAXIL Take 40 mg by mouth daily.   Qnasl 80 MCG/ACT  Aers Generic drug: Beclomethasone Dipropionate Place 2 sprays into the nose 2 (two) times daily as needed.   rOPINIRole 0.5 MG tablet Commonly known as: REQUIP Take 1 tablet (0.5 mg total) by mouth at bedtime.   simvastatin 40 MG tablet Commonly known as: ZOCOR Take 40 mg by mouth at bedtime.        Follow-up Information     [name], MD. Call in 1 week(s).   Specialty: Internal Medicine Contact information: [address] [place] [address] [phone]                Discharge Exam: Fredricka Weights   01/06/24 1757 01/10/24 0508  Weight: 58.6 kg 53.9 kg   Physical Exam  Constitutional: In no distress. Thin  Cardiovascular: Normal rate, regular rhythm. No lower extremity edema  Pulmonary: Non labored breathing on room air, no wheezing or rales.   Abdominal: Soft. Non distended and non tender Musculoskeletal: Normal range of motion.     Neurological: Alert and oriented to person, place, and time. HOH. Non focal  Skin: Skin is warm and dry.    Condition at discharge: stable  The results of significant diagnostics from this hospitalization (including imaging, microbiology, ancillary and laboratory) are listed below for reference.   Imaging Studies: DG Chest Port 1 View Result Date: 01/06/2024 CLINICAL DATA:  Questionable sepsis - evaluate for abnormality EXAM: PORTABLE CHEST 1 VIEW COMPARISON:  01/03/2024 FINDINGS: Heart and mediastinal contours are within normal limits. Aortic atherosclerosis. Peripheral densities in the upper lobes compatible with chronic lung disease/fibrosis. No acute confluent airspace opacities or effusions. No acute bony abnormality. IMPRESSION: Chronic changes.  No active disease. Electronically Signed   By: Franky Crease M.D.   On: 01/06/2024 18:26   DG Chest 2 View Result Date: 01/03/2024 EXAM: 2 VIEW(S) XRAY OF THE CHEST 01/03/2024 02:06:40 PM COMPARISON: None available. CLINICAL HISTORY: Weakness, fatigue. Pt to ED  with husband for fatigue for several weeks. Pt has dementia and husband reports pt has been laying in bed more than normal and possibly has UTI. Denies urinary sx. FINDINGS: LUNGS AND PLEURA: No focal pulmonary opacity. No pulmonary edema. No pleural effusion. No pneumothorax. HEART AND MEDIASTINUM: No acute abnormality of the cardiac and mediastinal silhouettes. BONES AND SOFT TISSUES: No acute osseous abnormality. IMPRESSION: 1. No acute process. Electronically signed by: Norleen Boxer MD 01/03/2024 02:32 PM EDT RP Workstation: [id number]    Microbiology: Results for orders placed or performed during the hospital encounter of 01/06/24  Blood Culture (routine x 2)     Status: None   Collection Time: 01/06/24  5:59 PM   Specimen: BLOOD  Result Value Ref Range Status   Specimen Description BLOOD BLOOD LEFT HAND  Final   Special Requests   Final    BOTTLES DRAWN AEROBIC AND ANAEROBIC Blood Culture results may not be optimal due to  an inadequate volume of blood received in culture bottles   Culture   Final    NO GROWTH 5 DAYS Performed at [facility], [address] [place] [place], [place] [address]    Report Status 01/11/2024 FINAL  Final  Blood Culture (routine x 2)     Status: None   Collection Time: 01/06/24  5:59 PM   Specimen: BLOOD  Result Value Ref Range Status   Specimen Description BLOOD RIGHT ANTECUBITAL  Final   Special Requests   Final    BOTTLES DRAWN AEROBIC AND ANAEROBIC Blood Culture adequate volume   Culture   Final    NO GROWTH 5 DAYS Performed at [facility], [address] [place], [place] [address]    Report Status 01/11/2024 FINAL  Final  Resp panel by RT-PCR (RSV, Flu A&B, Covid) Urine, Clean Catch     Status: None   Collection Time: 01/06/24  5:59 PM   Specimen: Urine, Clean Catch; Nasal Swab  Result Value Ref Range Status   SARS Coronavirus 2 by RT PCR NEGATIVE NEGATIVE Final    Comment: (NOTE) SARS-CoV-2 target nucleic acids are NOT  DETECTED.  The SARS-CoV-2 RNA is generally detectable in upper respiratory specimens during the acute phase of infection. The lowest concentration of SARS-CoV-2 viral copies this assay can detect is 138 copies/mL. A negative result does not preclude SARS-Cov-2 infection and should not be used as the sole basis for treatment or other patient management decisions. A negative result may occur with  improper specimen collection/handling, submission of specimen other than nasopharyngeal swab, presence of viral mutation(s) within the areas targeted by this assay, and inadequate number of viral copies(<138 copies/mL). A negative result must be combined with clinical observations, patient history, and epidemiological information. The expected result is Negative.  Fact Sheet for Patients:  BloggerCourse.com  Fact Sheet for Healthcare Providers:  SeriousBroker.it  This test is no t yet approved or cleared by the [place]  FDA and  has been authorized for detection and/or diagnosis of SARS-CoV-2 by FDA under an Emergency Use Authorization (EUA). This EUA will remain  in effect (meaning this test can be used) for the duration of the COVID-19 declaration under Section 564(b)(1) of the Act, 21 U.S.C.section 360bbb-3(b)(1), unless the authorization is terminated  or revoked sooner.       Influenza A by PCR NEGATIVE NEGATIVE Final   Influenza B by PCR NEGATIVE NEGATIVE Final    Comment: (NOTE) The Xpert Xpress SARS-CoV-2/FLU/RSV plus assay is intended as an aid in the diagnosis of influenza from Nasopharyngeal swab specimens and should not be used as a sole basis for treatment. Nasal washings and aspirates are unacceptable for Xpert Xpress SARS-CoV-2/FLU/RSV testing.  Fact Sheet for Patients: BloggerCourse.com  Fact Sheet for Healthcare Providers: SeriousBroker.it  This test is not yet  approved or cleared by the [place]  FDA and has been authorized for detection and/or diagnosis of SARS-CoV-2 by FDA under an Emergency Use Authorization (EUA). This EUA will remain in effect (meaning this test can be used) for the duration of the COVID-19 declaration under Section 564(b)(1) of the Act, 21 U.S.C. section 360bbb-3(b)(1), unless the authorization is terminated or revoked.     Resp Syncytial Virus by PCR NEGATIVE NEGATIVE Final    Comment: (NOTE) Fact Sheet for Patients: BloggerCourse.com  Fact Sheet for Healthcare Providers: SeriousBroker.it  This test is not yet approved or cleared by the [place]  FDA and has been authorized for detection and/or diagnosis of SARS-CoV-2 by  FDA under an Emergency Use Authorization (EUA). This EUA will remain in effect (meaning this test can be used) for the duration of the COVID-19 declaration under Section 564(b)(1) of the Act, 21 U.S.C. section 360bbb-3(b)(1), unless the authorization is terminated or revoked.  Performed at [facility], [address] [place], [place] [address]   KOH prep     Status: None   Collection Time: 01/09/24  5:30 PM   Specimen: Esophagus  Result Value Ref Range Status   Specimen Description ESOPHAGUS  Final   Special Requests NONE  Final   KOH Prep   Final    YEAST WITH PSEUDOHYPHAE Performed at [facility], [address] [place], [place] [address]    Report Status 01/09/2024 FINAL  Final    Labs: CBC: Recent Labs  Lab 01/06/24 1759 01/07/24 0143 01/07/24 0924 01/07/24 1136 01/08/24 0510 01/09/24 0423 01/10/24 0349 01/11/24 0408  WBC 13.9*   < > 9.6 8.8 9.2 9.0 9.9  --   NEUTROABS 9.4*  --   --   --   --   --   --   --   HGB 6.4*   < > 9.7* 8.5* 10.6* 10.1* 9.7* 9.9*  HCT 21.0*   < > 29.2* 24.9* 31.9* 30.1* 29.6* 30.4*  MCV 95.9   < > 92.1 89.2 90.1 92.0 92.2  --   PLT 394   < > 322 340 354 362 389  --     < > = values in this interval not displayed.   Basic Metabolic Panel: Recent Labs  Lab 01/06/24 1759 01/07/24 1136 01/08/24 0510 01/09/24 0423 01/10/24 0344 01/10/24 0349 01/11/24 0408  NA 132* 136 138 137  --  136  --   K 4.5 4.1 4.3 4.6  --  4.2 4.0  CL 100 104 104 104  --  102  --   CO2 19* 20* 26 25  --  24  --   GLUCOSE 163* 167* 137* 132*  --  132*  --   BUN 76* 39* 24* 18  --  15  --   CREATININE 1.05* 0.87 0.71 0.91  --  0.89  --   CALCIUM 8.3* 8.1* 8.7* 8.9  --  8.6*  --   MG  --   --   --   --  1.7  --  2.3   Liver Function Tests: Recent Labs  Lab 01/06/24 1759  AST 25  ALT 15  ALKPHOS 65  BILITOT 0.3  PROT 6.1*  ALBUMIN 2.8*   CBG: Recent Labs  Lab 01/10/24 1953 01/11/24 0024 01/11/24 0426 01/11/24 0832 01/11/24 1123  GLUCAP 164* 77 129* 136* 182*    Discharge time spent: greater than 30 minutes.  Signed: Alban Pepper, MD [facility] 01/11/2024

## 2024-01-11 NOTE — Evaluation (Signed)
 Occupational Therapy Evaluation Patient Details Name: [name] MRN: [id number] DOB: 06-30-1932 Today's Date: 01/11/2024   History of Present Illness   presented to ER secondary to progressive weakness; admitted for management of ABLA secondary to UGIB     Clinical Impressions Patient was seen for OT evaluation this date. Prior to hospital admission, patient was living in ALF with in home aid services helping 2 hours/2x a day, lives with spouse. Patient agitated with OT throughout entire eval, refused to get out of bed for first 10 minutes until daughter strongly encouraged her to work with therapy, patient/daughter argumentative, OT attempted to diffuse situation with minimal improvement. Patient reluctantly agreeable to ADL (getting dressed) in anticipation for SNF discharge. Patient refused to participate and received max-total A for LB dressing (depends/pants/shoes) and pull over shirt. She remained EOB with spouse present. Daughter frustrated/disappointed with patients attitude towards working with therapy. Plan for patient to dc to SNF today Paient would benefit from skilled OT services to address noted impairments and functional limitations (see below for any additional details) in order to maximize safety and independence while minimizing future risk of falls, injury, and readmission. Anticipate the need for follow up OT services upon acute hospital DC.      If plan is discharge home, recommend the following:   A lot of help with walking and/or transfers;A lot of help with bathing/dressing/bathroom     Functional Status Assessment   Patient has had a recent decline in their functional status and demonstrates the ability to make significant improvements in function in a reasonable and predictable amount of time.     Equipment Recommendations   Other (comment) (defer to next venue of care)     Recommendations for Other Services          Precautions/Restrictions   Precautions Precautions: Fall Recall of Precautions/Restrictions: Impaired Restrictions Weight Bearing Restrictions Per Provider Order: No     Mobility Bed Mobility Overal bed mobility: Needs Assistance Bed Mobility: Supine to Sit     Supine to sit: Mod assist          Transfers Overall transfer level: Needs assistance Equipment used: Rolling walker (2 wheels) Transfers: Sit to/from Stand Sit to Stand: Mod assist           General transfer comment: max coaxing to participate      Balance Overall balance assessment: Needs assistance Sitting-balance support: No upper extremity supported, Feet supported Sitting balance-Leahy Scale: Good     Standing balance support: Bilateral upper extremity supported Standing balance-Leahy Scale: Fair                             ADL either performed or assessed with clinical judgement   ADL Overall ADL's : Needs assistance/impaired         Upper Body Bathing: Moderate assistance   Lower Body Bathing: Maximal assistance   Upper Body Dressing : Moderate assistance   Lower Body Dressing: Maximal assistance;+2 for physical assistance                       Vision         Perception         Praxis         Pertinent Vitals/Pain Pain Assessment Pain Assessment: No/denies pain     Extremity/Trunk Assessment Upper Extremity Assessment Upper Extremity Assessment: Generalized weakness   Lower Extremity Assessment Lower Extremity Assessment: Defer to  PT evaluation       Communication Communication Communication: No apparent difficulties   Cognition Arousal: Alert Behavior During Therapy: Agitated Cognition: History of cognitive impairments             OT - Cognition Comments: mild cognitive impairment/memory                 Following commands: Impaired Following commands impaired: Follows one step commands inconsistently      Cueing  General Comments   Cueing Techniques: Verbal cues;Gestural cues;Tactile cues      Exercises     Shoulder Instructions      Home Living Family/patient expects to be discharged to:: Assisted living Living Arrangements: Spouse/significant other                           Home Equipment: Rolling Walker (2 wheels);Shower seat;Grab bars - toilet;Grab bars - tub/shower;Rollator (4 wheels);Toilet riser   Additional Comments: [place]      Prior Functioning/Environment Prior Level of Function : Independent/Modified Independent             Mobility Comments: Pt reported being modI with household ambulation with 2WW.   Enjoys going out to lunch with husband (dining hall or meals delivered to room for dinner).  Does endorse at least 2-3 falls in previous six months. ADLs Comments: Has private-pay aide to assist with ADLs, 7-9am and 4-6pm daily    OT Problem List: Decreased strength;Decreased activity tolerance;Impaired balance (sitting and/or standing);Decreased safety awareness;Decreased knowledge of use of DME or AE;Decreased knowledge of precautions   OT Treatment/Interventions: Self-care/ADL training;Therapeutic exercise;Energy conservation;DME and/or AE instruction;Therapeutic activities;Patient/family education;Balance training      OT Goals(Current goals can be found in the care plan section)   Acute Rehab OT Goals Patient Stated Goal: for you to leave me alone OT Goal Formulation: With patient/family Time For Goal Achievement: 01/25/24 Potential to Achieve Goals: Fair ADL Goals Pt Will Perform Grooming: with min assist;sitting Pt Will Perform Upper Body Dressing: with min assist;sitting Pt Will Perform Lower Body Dressing: with min assist;sit to/from stand;sitting/lateral leans Pt Will Transfer to Toilet: with min assist;stand pivot transfer;bedside commode   OT Frequency:  Min 2X/week    Co-evaluation              AM-PAC OT 6  Clicks Daily Activity     Outcome Measure Help from another person eating meals?: None Help from another person taking care of personal grooming?: None Help from another person toileting, which includes using toliet, bedpan, or urinal?: A Lot Help from another person bathing (including washing, rinsing, drying)?: A Lot Help from another person to put on and taking off regular upper body clothing?: A Lot Help from another person to put on and taking off regular lower body clothing?: A Lot 6 Click Score: 16   End of Session Equipment Utilized During Treatment: Gait belt;Rolling walker (2 wheels) Nurse Communication: Other (comment) (patient agitated with therapy; dressed ready for DC to SNF)  Activity Tolerance: Treatment limited secondary to agitation Patient left: in bed;with call bell/phone within reach;with family/visitor present;with bed alarm set  OT Visit Diagnosis: Unsteadiness on feet (R26.81);Other abnormalities of gait and mobility (R26.89);Repeated falls (R29.6);Muscle weakness (generalized) (M62.81);History of falling (Z91.81)                Time: 8679-8653 OT Time Calculation (min): 26 min Charges:  OT General Charges $OT Visit: 1 Visit OT Evaluation $OT Eval Low  Complexity: 1 Low OT Treatments $Self Care/Home Management : 23-37 mins  Rogers Clause, OT/L [facility], 01/11/2024

## 2024-01-11 NOTE — Discharge Instructions (Addendum)
 Please stop taking your aspirin and meloxicam because you had a GI bleed.   Please take your medicine protonix daily.   For your muscle cramps, please try to use muscle rubs like icy hot or voltaren gel to help with your symptoms. You will discharge with a medicine called requip, it can sometimes make you dizzy with standing please let Dr. Fernande know if this happens.  Please make sure that she stay well-hydrated.   You were also started on oral iron. Please make sure you take a bowel regimen (miralax) as oral iron can make you constipated. I have also referred you to get IV iron infusions. Please discuss this with Dr. Fernande at your follow up appointment.

## 2024-01-11 NOTE — TOC Progression Note (Signed)
 Transition of Care [facility]) - Progression Note    Patient Details  Name: [name] MRN: [id number] Date of Birth: 1932-12-09  Transition of Care [facility]) CM/SW Contact  Alfonso Rummer, LCSW Phone Number: 01/11/2024, 9:29 AM  Clinical Narrative:     LCSW A. Rummer met with pt and family in room 233. Advised pt and family I spoke with Holley Molt at [facility] and was informed [place] FL2 form and Discharge summary is required to transition to skilled nursing side of [place] for short term pt and ot. Advise pt and family once pt is medically ready for dx LCSW A. Deklynn Charlet will ensure safe discharge measures are in place.                     Expected Discharge Plan and [address] of [place].                                       Social Drivers of Health (SDOH) Interventions SDOH Screenings   Food Insecurity: No Food Insecurity (01/08/2024)  Housing: Low Risk  (01/08/2024)  Transportation Needs: No Transportation Needs (01/08/2024)  Utilities: Not At Risk (01/08/2024)  Social Connections: Moderately Isolated (01/08/2024)  Tobacco Use: Low Risk  (01/06/2024)    Readmission Risk Interventions     No data to display

## 2024-01-11 NOTE — Progress Notes (Signed)
 Left room via EMS for D/C. Alert and oriented. Husband present with pt.  En route to outside facility.

## 2024-01-12 ENCOUNTER — Telehealth (HOSPITAL_COMMUNITY): Payer: Self-pay

## 2024-01-12 NOTE — Telephone Encounter (Signed)
 Auth Submission: NO AUTH NEEDED Site of care: Site of care: [facility] Payer: Medicare A/B, BCBS Supplement Medication & CPT/J Code(s) submitted: Feraheme (ferumoxytol) [id number] Diagnosis Code: G25.81/D50.9, K92.2 Route of submission (phone, fax, portal):  Phone # Fax # Auth type: Buy/Bill HB Units/visits requested: 510mg  x 2 doses Reference number:  Approval from: 01/12/24 to 04/27/24

## 2024-02-01 NOTE — Assessment & Plan Note (Signed)
 Well controlled on metformin alone

## 2024-02-01 NOTE — Assessment & Plan Note (Signed)
 Doing well on mag/zinc combo.

## 2024-02-01 NOTE — Progress Notes (Signed)
 [name] is a 89 y.o. female seen for follow up visit at [facility] place  CHIEF COMPLAINT:  Follow up medical problems    HISTORY OF PRESENT ILLNESS:  [name] has been     Primary osteoarthritis of both knees Severe and seems to limit her mobility and therefore functionality. Will try long acting tylenol  but will need to be careful of daily tylenol  dose   Confusion, unspecified Likely longstanding with mci or dementia but not clear in chart thus far, b12 and tsh nl 10-25   Controlled type 2 diabetes mellitus without complication, without long-term current use of insulin  (CMS/HHS-HCC) Well controlled on metformin alone  Cramps, extremity Doing well on mag/zinc combo.   No fever chills sweats are noted, no nausea vomiting or diarrhea have been noted, no chest pain or sob either   Past Medical History:  Diagnosis Date  . Adenomatous polyps    on previous colonoscopy. Colon 10/12 with diverticulosis, internal hemorrhoids; repeat recommended 3-5 yrs.  . Aortic atherosclerosis ()   . Essential hypertension, benign   . ILD (interstitial lung disease) (CMS/HHS-HCC)   . Insomnia   . Menopausal syndrome   . Osteopenia 09/30/2013  . Other and unspecified hyperlipidemia   . Pure hypercholesterolemia   . Recurrent major depressive disorder, in full remission ()   . Senile purpura ()   . Type II or unspecified type diabetes mellitus without mention of complication, not stated as uncontrolled (CMS/HHS-HCC)     Past Surgical History:  Procedure Laterality Date  . KNEE ARTHROSCOPY Left 04/11      Social History   Socioeconomic History  . Marital status: Widowed  Tobacco Use  . Smoking status: Never  . Smokeless tobacco: Never  Substance and Sexual Activity  . Alcohol use: Yes    Alcohol/week: 0.0 standard drinks of alcohol    Comment: occasionally   Social Drivers of Health   Food Insecurity: No Food Insecurity (01/08/2024)   Received from [facility]    Hunger Vital Sign   . Within the past 12 months, you worried that your food would run out before you got the money to buy more.: Never true   . Within the past 12 months, the food you bought just didn't last and you didn't have money to get more.: Never true  Transportation Needs: No Transportation Needs (01/08/2024)   Received from [facility] - Transportation   . In the past 12 months, has lack of transportation kept you from medical appointments or from getting medications?: No   . In the past 12 months, has lack of transportation kept you from meetings, work, or from getting things needed for daily living?: No  Social Connections: Moderately Isolated (01/08/2024)   Received from [facility]   Social Connection and Isolation Panel   . In a typical week, how many times do you talk on the phone with family, friends, or neighbors?: More than three times a week   . How often do you get together with friends or relatives?: More than three times a week   . How often do you attend church or religious services?: Never   . Do you belong to any clubs or organizations such as church groups, unions, fraternal or athletic groups, or school groups?: No   . How often do you attend meetings of the clubs or organizations you belong to?: Never   . Are you married, widowed, divorced, separated, never married, or living with a  partner?: Married  Housing Stability: Unknown (11/03/2023)   Housing Stability Vital Sign   . Homeless in the Last Year: No     Medication reviewed and signed on the nursing facility chart as appropriate   all Weight: 125.2 Lbs 01/31/2024 16:05  Terra Wilson (Manual)  view all Blood Pressure: 130 /  83 mmHg 01/30/2024 09:12  /  [employer] Blush (Manual)  view all Temperature: 97.3 F 01/30/2024 09:12   Terra Wilson (Manual)  view all Pulse: 96 bpm 01/30/2024 09:12   Terra Wilson (Manual)  view all Respirations: 16 Breaths/min 01/30/2024 09:12   Terra Wilson (Manual)  view all Blood  Sugar:        view all O2 Saturation: 95.0% 01/30/2024 09:12   Terra Wilson (Manual)  view all Height:        view all Pain Level: 0 02/01/2024 07:23   No acute distress No icterus  No JVD Lungs; clear to ascultation Heart; Regular rate and rhythm  Abdomen; Soft and flat, normal bowel sounds Extremities; No clubbing, cyanosis or edema  Available labs reviewed in the nursing home chart.   ASSESSMENT  AND PLAN: Diagnoses and all orders for this visit:  Primary osteoarthritis of both knees Assessment & Plan: Severe and seems to limit her mobility and therefore functionality. Will try long acting tylenol  but will need to be careful of daily tylenol  dose    Confusion, unspecified Assessment & Plan: Likely longstanding with mci or dementia but not clear in chart thus far, b12 and tsh nl 10-25    Controlled type 2 diabetes mellitus without complication, without long-term current use of insulin  (CMS/HHS-HCC) Assessment & Plan: Well controlled on metformin alone   Cramps, extremity Assessment & Plan: Doing well on mag/zinc combo.      All problems listed above are stable and will be followed except at noted otherwise. This patient is a complicated patient requiring skilled nursing care and multiple medications and multiple medical problems which need and will be monitored. Decompensation remains a risk and the medication regimen will be reordered as needed and adjusted. I have reordered all the medications as is required. Aggressive care is not indicated given illnesses and advanced age. Will try not to overburden this patient with testing and non essential treatments.

## 2024-02-01 NOTE — Assessment & Plan Note (Signed)
 Likely longstanding with mci or dementia but not clear in chart thus far, b12 and tsh nl 10-25

## 2024-02-01 NOTE — Assessment & Plan Note (Signed)
 Severe and seems to limit her mobility and therefore functionality. Will try long acting tylenol  but will need to be careful of daily tylenol  dose

## 2024-10-08 ENCOUNTER — Ambulatory Visit: Payer: Medicare Other | Admitting: Dermatology
